# Patient Record
Sex: Female | Born: 1970 | Race: Black or African American | Hispanic: No | Marital: Single | Smoking: Never smoker
Health system: Southern US, Community
[De-identification: ages and names within clinical notes are randomized; demographics above are authoritative.]

## PROBLEM LIST (undated history)

## (undated) DIAGNOSIS — M1990 Unspecified osteoarthritis, unspecified site: Secondary | ICD-10-CM

## (undated) DIAGNOSIS — D649 Anemia, unspecified: Secondary | ICD-10-CM

## (undated) DIAGNOSIS — L0390 Cellulitis, unspecified: Secondary | ICD-10-CM

## (undated) DIAGNOSIS — T7840XA Allergy, unspecified, initial encounter: Secondary | ICD-10-CM

## (undated) DIAGNOSIS — D352 Benign neoplasm of pituitary gland: Secondary | ICD-10-CM

## (undated) DIAGNOSIS — J45909 Unspecified asthma, uncomplicated: Secondary | ICD-10-CM

## (undated) DIAGNOSIS — N926 Irregular menstruation, unspecified: Secondary | ICD-10-CM

## (undated) DIAGNOSIS — E785 Hyperlipidemia, unspecified: Secondary | ICD-10-CM

## (undated) DIAGNOSIS — N6452 Nipple discharge: Secondary | ICD-10-CM

## (undated) DIAGNOSIS — N6320 Unspecified lump in the left breast, unspecified quadrant: Secondary | ICD-10-CM

## (undated) HISTORY — DX: Unspecified osteoarthritis, unspecified site: M19.90

## (undated) HISTORY — DX: Hyperlipidemia, unspecified: E78.5

## (undated) HISTORY — DX: Cellulitis, unspecified: L03.90

## (undated) HISTORY — DX: Anemia, unspecified: D64.9

## (undated) HISTORY — DX: Allergy, unspecified, initial encounter: T78.40XA

## (undated) HISTORY — PX: TONSILLECTOMY: SUR1361

---

## 2013-12-19 ENCOUNTER — Encounter: Payer: Self-pay | Admitting: Internal Medicine

## 2013-12-19 ENCOUNTER — Ambulatory Visit: Payer: Self-pay | Admitting: Internal Medicine

## 2013-12-19 VITALS — BP 110/64 | HR 76 | Temp 98.1°F | Resp 16 | Ht 64.75 in | Wt 235.4 lb

## 2013-12-19 DIAGNOSIS — Z889 Allergy status to unspecified drugs, medicaments and biological substances status: Secondary | ICD-10-CM

## 2013-12-19 DIAGNOSIS — D352 Benign neoplasm of pituitary gland: Secondary | ICD-10-CM | POA: Insufficient documentation

## 2013-12-19 DIAGNOSIS — D649 Anemia, unspecified: Secondary | ICD-10-CM | POA: Insufficient documentation

## 2013-12-19 DIAGNOSIS — Z91018 Allergy to other foods: Secondary | ICD-10-CM | POA: Insufficient documentation

## 2013-12-19 DIAGNOSIS — J45909 Unspecified asthma, uncomplicated: Secondary | ICD-10-CM

## 2013-12-19 DIAGNOSIS — J302 Other seasonal allergic rhinitis: Secondary | ICD-10-CM | POA: Insufficient documentation

## 2013-12-19 MED ORDER — EPINEPHRINE 0.3 MG/0.3ML IJ SOAJ: INTRAMUSCULAR | Status: DC

## 2013-12-19 MED ORDER — ALBUTEROL SULFATE HFA 108 (90 BASE) MCG/ACT IN AERS: INHALATION_SPRAY | RESPIRATORY_TRACT | Status: DC

## 2013-12-19 NOTE — Patient Instructions (Signed)

## 2013-12-19 NOTE — Progress Notes (Signed)
   Subjective:    Patient ID: Wanda Douglas, female    DOB: 04-08-1971, 43 y.o.   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 Patient has hx/o infrequent allergic asthma for which she uses a prn albuterol MDI. She also reports several food allergies as bananas, nuts, and shellfish causing hives. She has a remote hx/o pituitary adenoma (1997) for which she has been sporadically treated with Bromocriptine for elevated prolactin levels.  She reports TBc skin test done recently was negative.  No Current Meds.  No Known Drug Allergies.   Review of Systems neg except as above.   Objective:   Physical Exam BP 110/64  Pulse 76  Temp(Src) 98.1 F (36.7 C) (Temporal)  Resp 16  Ht 5' 4.75" (1.645 m)  Wt 235 lb 6.4 oz (106.777 kg)  BMI 39.46 kg/m2  HEENT - Eac's patent. TM's Nl. EOM's full. PERRLA. NasoOroPharynx clear. Neck - supple. Nl Thyroid. Carotids 2+ & No bruits, nodes, JVD Chest - Clear equal BS w/o Rales, rhonchi, wheezes. Cor - Nl HS. RRR w/o sig MGR. PP 1(+). No edema. Abd - No palpable organomegaly, masses or tenderness. BS nl. MS- FROM w/o deformities. Muscle power, tone and bulk Nl. Gait Nl. Neuro - No obvious Cr N abnormalities. Sensory, motor and Cerebellar functions appear Nl w/o focal abnormalities. Psyche - Mental status normal & appropriate.  No delusions, ideations or obvious mood abnormalities.  Assessment & Plan:   1. Intrinsic asthma  Rx written for Albuterol MDI and EpiPen Duo  Forms filled out for employment.

## 2015-05-07 ENCOUNTER — Other Ambulatory Visit: Payer: Self-pay | Admitting: Physician Assistant

## 2015-05-07 ENCOUNTER — Encounter: Payer: Self-pay | Admitting: Physician Assistant

## 2015-05-07 ENCOUNTER — Ambulatory Visit (INDEPENDENT_AMBULATORY_CARE_PROVIDER_SITE_OTHER): Payer: BC Managed Care – PPO | Admitting: Physician Assistant

## 2015-05-07 VITALS — BP 118/64 | HR 65 | Temp 97.5°F | Ht 64.75 in | Wt 224.0 lb

## 2015-05-07 DIAGNOSIS — Z13220 Encounter for screening for lipoid disorders: Secondary | ICD-10-CM

## 2015-05-07 DIAGNOSIS — N6452 Nipple discharge: Secondary | ICD-10-CM | POA: Diagnosis not present

## 2015-05-07 DIAGNOSIS — Z131 Encounter for screening for diabetes mellitus: Secondary | ICD-10-CM | POA: Diagnosis not present

## 2015-05-07 DIAGNOSIS — E221 Hyperprolactinemia: Secondary | ICD-10-CM

## 2015-05-07 LAB — COMPREHENSIVE METABOLIC PANEL
ALBUMIN: 3.6 g/dL (ref 3.6–5.1)
ALT: 14 U/L (ref 6–29)
AST: 15 U/L (ref 10–30)
Alkaline Phosphatase: 75 U/L (ref 33–115)
BUN: 15 mg/dL (ref 7–25)
CHLORIDE: 105 mmol/L (ref 98–110)
CO2: 25 mmol/L (ref 20–31)
CREATININE: 0.75 mg/dL (ref 0.50–1.10)
Calcium: 8.6 mg/dL (ref 8.6–10.2)
Glucose, Bld: 87 mg/dL (ref 65–99)
Potassium: 3.9 mmol/L (ref 3.5–5.3)
SODIUM: 139 mmol/L (ref 135–146)
TOTAL PROTEIN: 6.4 g/dL (ref 6.1–8.1)
Total Bilirubin: 0.3 mg/dL (ref 0.2–1.2)

## 2015-05-07 LAB — CBC WITH DIFFERENTIAL/PLATELET
Basophils Absolute: 0 10*3/uL (ref 0.0–0.1)
Basophils Relative: 0 % (ref 0–1)
Eosinophils Absolute: 0.1 10*3/uL (ref 0.0–0.7)
Eosinophils Relative: 2 % (ref 0–5)
HEMATOCRIT: 40.5 % (ref 36.0–46.0)
Hemoglobin: 13 g/dL (ref 12.0–15.0)
LYMPHS ABS: 2.5 10*3/uL (ref 0.7–4.0)
LYMPHS PCT: 36 % (ref 12–46)
MCH: 26.9 pg (ref 26.0–34.0)
MCHC: 32.1 g/dL (ref 30.0–36.0)
MCV: 83.9 fL (ref 78.0–100.0)
MONOS PCT: 8 % (ref 3–12)
MPV: 11.2 fL (ref 8.6–12.4)
Monocytes Absolute: 0.6 10*3/uL (ref 0.1–1.0)
NEUTROS ABS: 3.8 10*3/uL (ref 1.7–7.7)
NEUTROS PCT: 54 % (ref 43–77)
Platelets: 204 10*3/uL (ref 150–400)
RBC: 4.83 MIL/uL (ref 3.87–5.11)
RDW: 14.2 % (ref 11.5–15.5)
WBC: 7 10*3/uL (ref 4.0–10.5)

## 2015-05-07 LAB — LIPID PANEL
Cholesterol: 184 mg/dL (ref 125–200)
HDL: 65 mg/dL (ref >=46)
LDL CALC: 99 mg/dL (ref <130)
TRIGLYCERIDES: 99 mg/dL (ref <150)
Total CHOL/HDL Ratio: 2.8 Ratio (ref <=5.0)
VLDL: 20 mg/dL (ref <30)

## 2015-05-07 LAB — TSH: TSH: 0.518 u[IU]/mL (ref 0.350–4.500)

## 2015-05-07 NOTE — Progress Notes (Addendum)
Subjective:    Patient ID: Wanda Douglas, female    DOB: Sep 23, 1970, 45 y.o.   MRN: [id number]  HPI  45 y.o. 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 Last MGM was 2 years ago in [place], [facility] with breast cancer, no history of ovarian cancer. Patient has had headaches, no visual distrubances, no hypothyroidism symptoms, night sweats, weight loss. Patient is not sexually active.   Blood pressure 118/64, pulse 65, temperature 97.5 F (36.4 C), temperature source Temporal, height 5' 4.75" (1.645 m), weight 224 lb (101.606 kg), last menstrual period 05/03/2015, SpO2 97 %.  No past medical history on file. Current Outpatient Prescriptions on File Prior to Visit  Medication Sig Dispense Refill  . albuterol (PROVENTIL HFA;VENTOLIN HFA) 108 (90 BASE) MCG/ACT inhaler 1 to 2 puffs (5 minutes apart) every 4 hour as needed for Asthma 8.5 g 99  . calcium carbonate (TUMS EX) 750 MG chewable tablet Chew 2 tablets by mouth daily.    . cetirizine (ZYRTEC) 10 MG tablet Take 10 mg by mouth daily.    . cholecalciferol (VITAMIN D) 1000 UNITS tablet Take 1,000 Units by mouth daily.    Marland Kitchen EPINEPHrine 0.3 mg/0.3 mL IJ SOAJ injection Inject intramuscular for severe Allergic Reaction and may repeat in 10-15 minutes if needed 2 Device 99  . Multiple Vitamin (MULTIVITAMIN) tablet Take 1 tablet by mouth daily.     No current facility-administered medications on file prior to visit.    Review of Systems  Constitutional: Negative.   HENT: Negative.   Respiratory: Negative.   Cardiovascular: Negative.   Gastrointestinal: Negative.   Genitourinary: Negative.        + bloody discharge from left nipple  Musculoskeletal: Negative.   Skin: Negative.   Neurological: Negative.   Hematological: Negative.     Psychiatric/Behavioral: Negative.        Objective:   Physical Exam  Constitutional: She is oriented to person, place, and time. She appears well-developed and well-nourished.  Obese  HENT:  Head: Normocephalic and atraumatic.  Eyes: Conjunctivae and EOM are normal. Pupils are equal, round, and reactive to light.  Neck: Normal range of motion. Neck supple.  Cardiovascular: Normal rate and regular rhythm.   Pulmonary/Chest: Effort normal and breath sounds normal. Right breast exhibits no inverted nipple, no mass, no nipple discharge, no skin change and no tenderness. Left breast exhibits mass and nipple discharge. Left breast exhibits no inverted nipple, no skin change and no tenderness.    Abdominal: Soft. Bowel sounds are normal. There is no tenderness.  Musculoskeletal: Normal range of motion.  Lymphadenopathy:    She has no cervical adenopathy.    She has no axillary adenopathy.       Right: No supraclavicular adenopathy present.       Left: No supraclavicular adenopathy present.  Neurological: She is alert and oriented to person, place, and time. She has normal reflexes. No cranial nerve deficit.  Skin: Skin is warm and dry.        Assessment & Plan:  1. Bloody discharge from left nipple Has put in order for Diagnotic MGM and [place] Check labs - CBC with Differential/Platelet - TSH - Comprehensive metabolic panel - Prolactin  2. Morbid obesity, unspecified obesity type ([place]) Obesity with co morbidities- long discussion about weight loss, diet, and exercise  3. Screening for diabetes mellitus - Hemoglobin A1c  4. Screening cholesterol level - Lipid panel  Addendum: Elevated prolactin, will get MRI for possible adenoma but will proceed after MGM.

## 2015-05-08 LAB — HEMOGLOBIN A1C
HEMOGLOBIN A1C: 5.7 % — AB (ref <5.7)
Mean Plasma Glucose: 117 mg/dL — ABNORMAL HIGH (ref <117)

## 2015-05-08 LAB — PROLACTIN: PROLACTIN: 103.4 ng/mL

## 2015-05-08 NOTE — Addendum Note (Signed)
Addended by: Vicie Mutters R on: 05/08/2015 09:35 AM   Modules accepted: Orders

## 2015-05-10 ENCOUNTER — Other Ambulatory Visit: Payer: Self-pay | Admitting: Physician Assistant

## 2015-05-10 ENCOUNTER — Ambulatory Visit
Admission: RE | Admit: 2015-05-10 | Discharge: 2015-05-10 | Disposition: A | Discharge status: Home / Self Care | Payer: BC Managed Care – PPO | Source: Ambulatory Visit | Attending: Physician Assistant | Admitting: Physician Assistant

## 2015-05-10 DIAGNOSIS — N6452 Nipple discharge: Secondary | ICD-10-CM

## 2015-05-13 ENCOUNTER — Other Ambulatory Visit: Payer: Self-pay | Admitting: Physician Assistant

## 2015-05-13 DIAGNOSIS — N6452 Nipple discharge: Secondary | ICD-10-CM

## 2015-05-16 ENCOUNTER — Ambulatory Visit
Admission: RE | Admit: 2015-05-16 | Discharge: 2015-05-16 | Disposition: A | Discharge status: Home / Self Care | Payer: BC Managed Care – PPO | Source: Ambulatory Visit | Attending: Physician Assistant | Admitting: Physician Assistant

## 2015-05-16 DIAGNOSIS — N6452 Nipple discharge: Secondary | ICD-10-CM

## 2015-06-05 ENCOUNTER — Ambulatory Visit
Admission: RE | Admit: 2015-06-05 | Discharge: 2015-06-05 | Disposition: A | Discharge status: Home / Self Care | Payer: BC Managed Care – PPO | Source: Ambulatory Visit | Attending: Physician Assistant | Admitting: Physician Assistant

## 2015-06-05 DIAGNOSIS — E221 Hyperprolactinemia: Secondary | ICD-10-CM

## 2015-06-05 DIAGNOSIS — N6452 Nipple discharge: Secondary | ICD-10-CM

## 2015-06-13 ENCOUNTER — Other Ambulatory Visit: Payer: Self-pay | Admitting: Physician Assistant

## 2015-06-13 DIAGNOSIS — E221 Hyperprolactinemia: Secondary | ICD-10-CM

## 2015-06-13 DIAGNOSIS — E229 Hyperfunction of pituitary gland, unspecified: Secondary | ICD-10-CM

## 2015-06-13 DIAGNOSIS — D352 Benign neoplasm of pituitary gland: Secondary | ICD-10-CM

## 2015-11-08 ENCOUNTER — Encounter (HOSPITAL_COMMUNITY): Payer: Self-pay | Admitting: Emergency Medicine

## 2015-11-08 ENCOUNTER — Emergency Department (HOSPITAL_COMMUNITY)
Admission: EM | Admit: 2015-11-08 | Discharge: 2015-11-09 | Disposition: A | Discharge status: Home / Self Care | Payer: BC Managed Care – PPO | Attending: Emergency Medicine | Admitting: Emergency Medicine

## 2015-11-08 DIAGNOSIS — Z79899 Other long term (current) drug therapy: Secondary | ICD-10-CM | POA: Insufficient documentation

## 2015-11-08 DIAGNOSIS — Z91010 Allergy to peanuts: Secondary | ICD-10-CM | POA: Diagnosis not present

## 2015-11-08 DIAGNOSIS — R0602 Shortness of breath: Secondary | ICD-10-CM | POA: Diagnosis present

## 2015-11-08 DIAGNOSIS — J029 Acute pharyngitis, unspecified: Secondary | ICD-10-CM | POA: Insufficient documentation

## 2015-11-08 DIAGNOSIS — Z91013 Allergy to seafood: Secondary | ICD-10-CM | POA: Diagnosis not present

## 2015-11-08 DIAGNOSIS — J45909 Unspecified asthma, uncomplicated: Secondary | ICD-10-CM | POA: Diagnosis not present

## 2015-11-08 DIAGNOSIS — Z91018 Allergy to other foods: Secondary | ICD-10-CM | POA: Insufficient documentation

## 2015-11-08 HISTORY — DX: Unspecified asthma, uncomplicated: J45.909

## 2015-11-08 LAB — RAPID STREP SCREEN (MED CTR MEBANE ONLY): Streptococcus, Group A Screen (Direct): NEGATIVE

## 2015-11-08 NOTE — ED Notes (Signed)
Pt states about 4 hours ago her throat started to get sore and it has gotten progressively worse.  States it is hurting to swallow and speak.

## 2015-11-09 MED ORDER — LIDOCAINE VISCOUS 2 % MT SOLN
15.0000 mL | Freq: Once | OROMUCOSAL | Status: AC
  Administered 2015-11-09: 15 mL via OROMUCOSAL
  Filled 2015-11-09: qty 15

## 2015-11-09 MED ORDER — LIDOCAINE VISCOUS 2 % MT SOLN: 15.0000 mL | Freq: Four times a day (QID) | OROMUCOSAL | Status: DC | PRN

## 2015-11-09 MED ORDER — ACETAMINOPHEN 325 MG PO TABS
650.0000 mg | ORAL_TABLET | Freq: Once | ORAL | Status: AC
  Administered 2015-11-09: 650 mg via ORAL
  Filled 2015-11-09: qty 2

## 2015-11-09 NOTE — Discharge Instructions (Signed)
Read the information below.   Your rapid strep was negative. It will be sent for culture, if positive you will be notified. You were given tylenol and viscous lidocaine in the ED.  I have provided a prescription for viscous lidocaine. You can also try cepacol throat drops for relief. Take tylenol 650mg  every 6hrs or motrin 400mg  every 6hrs as needed for pain relief.  At this time, you have declined an x-ray. If you develop shortness of breath, wheezing, cough, or fever return to ED for re-evaluation.  Use the prescribed medication as directed.  Please discuss all new medications with your pharmacist.   Be sure to call and schedule a follow up appointment with your primary care provider if your symptoms do not improve. You may return to the Emergency Department at any time for worsening condition or any new symptoms that concern you. Return to ED immediately if you develop worsening symptoms, fever, inability to swallow, trouble breathing, shortness of breath, or chest pain.     Pharyngitis Pharyngitis is a sore throat (pharynx). There is redness, pain, and swelling of your throat. HOME CARE   Drink enough fluids to keep your pee (urine) clear or pale yellow.  Only take medicine as told by your doctor.  You may get sick again if you do not take medicine as told. Finish your medicines, even if you start to feel better.  Do not take aspirin.  Rest.  Rinse your mouth (gargle) with salt water ( tsp of salt per 1 qt of water) every 1-2 hours. This will help the pain.  If you are not at risk for choking, you can suck on hard candy or sore throat lozenges. GET HELP IF:  You have large, tender lumps on your neck.  You have a rash.  You cough up green, yellow-brown, or bloody spit. GET HELP RIGHT AWAY IF:   You have a stiff neck.  You drool or cannot swallow liquids.  You throw up (vomit) or are not able to keep medicine or liquids down.  You have very bad pain that does not go away  with medicine.  You have problems breathing (not from a stuffy nose). MAKE SURE YOU:   Understand these instructions.  Will watch your condition.  Will get help right away if you are not doing well or get worse.   This information is not intended to replace advice given to you by your health care provider. Make sure you discuss any questions you have with your health care provider.   Document Released: 10/07/2007 Document Revised: 02/08/2013 Document Reviewed: 12/26/2012 Elsevier Interactive Patient Education [employer].

## 2015-11-09 NOTE — ED Provider Notes (Signed)
CSN: [id number]     Arrival date & time 11/08/15  2316 History   First MD Initiated Contact with Patient 11/08/15 2356     Chief Complaint  Patient presents with  . Sore Throat     (Consider location/radiation/quality/duration/timing/severity/associated sxs/prior Treatment) HPI Comments: Wanda Douglas is a 45 y.o. female with history of asthma, pituitary disease to ED with complaint of sore throat x 4 hours. Describes sore throat as "dry and scratchy." Associated odynophagia. She is managing her oral secretions and she is able to eat and drink without difficulty. Prior to her sore throat she did have nasal congestion; however, sxs have since resolved. She does endorse a dry cough and some shortness of breath. Denies fever, chills, night sweats, wheezing, chest pain, leg swelling. No fever, chills, or night sweats. She does endorse a non-productive cough and some shortness of breath. Denies chest pain, wheezing, leg swelling. She has tried tea with honey, which provided transient relief.   The history is provided by the patient and medical records.    Past Medical History  Diagnosis Date  . Pituitary abnormality ([place])   . Asthma    Past Surgical History  Procedure Laterality Date  . Tonsillectomy     No family history on file. Social History  Substance Use Topics  . Smoking status: Never Smoker   . Smokeless tobacco: Never Used  . Alcohol Use: No   OB History    No data available     Review of Systems  Constitutional: Negative for fever, chills, diaphoresis and fatigue.  HENT: Positive for congestion ( resolved) and sore throat. Negative for drooling, ear pain, postnasal drip, rhinorrhea, sinus pressure and trouble swallowing.   Eyes: Negative for discharge.  Respiratory: Positive for cough and shortness of breath. Negative for wheezing.   Cardiovascular: Negative for chest pain, palpitations and leg swelling.  Musculoskeletal: Negative for myalgias and arthralgias.   Allergic/Immunologic: Positive for environmental allergies.  Neurological: Negative for headaches.      Allergies  Banana; Shellfish allergy; and Peanut-containing drug products  Home Medications   Prior to Admission medications   Medication Sig Start Date End Date Taking? Authorizing Provider  albuterol (PROVENTIL HFA;VENTOLIN HFA) 108 (90 BASE) MCG/ACT inhaler 1 to 2 puffs (5 minutes apart) every 4 hour as needed for Asthma 12/19/13   Unk Pinto, MD  calcium carbonate (TUMS EX) 750 MG chewable tablet Chew 2 tablets by mouth daily.    Historical Provider, MD  cetirizine (ZYRTEC) 10 MG tablet Take 10 mg by mouth daily.    Historical Provider, MD  cholecalciferol (VITAMIN D) 1000 UNITS tablet Take 1,000 Units by mouth daily.    Historical Provider, MD  EPINEPHrine 0.3 mg/0.3 mL IJ SOAJ injection Inject intramuscular for severe Allergic Reaction and may repeat in 10-15 minutes if needed 12/19/13   Unk Pinto, MD  lidocaine (XYLOCAINE) 2 % solution Use as directed 15 mLs in the mouth or throat every 6 (six) hours as needed for mouth pain. Gargle and spit every 6hrs as needed for sore throat relief. 11/09/15   Roxanna Mew, PA-C  Multiple Vitamin (MULTIVITAMIN) tablet Take 1 tablet by mouth daily.    Historical Provider, MD   BP 129/87 mmHg  Pulse 67  Temp(Src) 98.8 F (37.1 C) (Oral)  Resp 18  Ht 5\' 3"  (1.6 m)  Wt 103.874 kg  BMI 40.58 kg/m2  SpO2 100%  LMP 10/21/2015 (Approximate) Physical Exam  Constitutional: She appears well-developed and well-nourished. No distress.  HENT:  Head: Normocephalic and atraumatic.  Right Ear: Tympanic membrane, external ear and ear canal normal.  Left Ear: Tympanic membrane, external ear and ear canal normal.  Nose: Right sinus exhibits no maxillary sinus tenderness and no frontal sinus tenderness. Left sinus exhibits no maxillary sinus tenderness and no frontal sinus tenderness.  Mouth/Throat: Uvula is midline and mucous membranes  are normal. No trismus in the jaw. No uvula swelling. Oropharyngeal exudate and posterior oropharyngeal erythema present. No posterior oropharyngeal edema or tonsillar abscesses.  Mild posterior oropharyngeal erythema with exudate. No trismus. Uvula is midline. No sublingual swelling. No soft palate swelling.    Eyes: Conjunctivae are normal. Pupils are equal, round, and reactive to light. Right eye exhibits no discharge. Left eye exhibits no discharge. No scleral icterus.  Neck: Normal range of motion and phonation normal. No rigidity. Normal range of motion present.  Cardiovascular: Normal rate, regular rhythm and normal heart sounds.   No murmur heard. Pulmonary/Chest: Effort normal and breath sounds normal. No stridor. No respiratory distress. She has no wheezes. She has no rales.  Lymphadenopathy:    She has cervical adenopathy.  Neurological: She is alert.  Skin: Skin is warm and dry. She is not diaphoretic.  Psychiatric: She has a normal mood and affect. Her behavior is normal.    ED Course  Procedures (including critical care time) Labs Review Labs Reviewed  RAPID STREP SCREEN (NOT AT [facility])  CULTURE, GROUP A STREP [facility])    Imaging Review No results found. I have personally reviewed and evaluated these images and lab results as part of my medical decision-making.   EKG Interpretation None      MDM   Final diagnoses:  Pharyngitis    Pt afebrile and non-toxic appearing in NAD. Blood pressure is mildly elevated, vital signs otherwise stable. Physical exam remarkable for mild posterior oropharyngeal erythema with exudate, negative strep. Presents with mild cervical lymphadenopathy, & odynophagia. Lungs are clear to auscultation, no wheezing.   Diagnosis of viral pharyngitis. No abx indicated. DC w symptomatic tx for pain.  Pt does not appear dehydrated, but did discuss importance of water rehydration. Presentation non concerning for PTA or infxn spread to soft tissue. No  trismus or uvula deviation. Discussed option of CXR, pt politely declined at this time will return if cough, wheezing, or SOB develops. Tylenol and viscous lidocaine given in ED. Specific return precautions discussed. Pt able to drink water in ED without difficulty with intact air way. Recommended PCP follow up. Pt voiced understanding and is agreeable.      Roxanna Mew, [place] 11/09/15 1047  Lacretia Leigh, MD 11/10/15 0001

## 2015-11-11 LAB — CULTURE, GROUP A STREP (THRC)

## 2017-04-14 ENCOUNTER — Encounter: Payer: Self-pay | Admitting: Physician Assistant

## 2017-04-14 ENCOUNTER — Ambulatory Visit: Payer: BC Managed Care – PPO | Admitting: Physician Assistant

## 2017-04-14 VITALS — BP 128/76 | HR 90 | Temp 97.5°F | Resp 14 | Wt 240.0 lb

## 2017-04-14 DIAGNOSIS — D352 Benign neoplasm of pituitary gland: Secondary | ICD-10-CM

## 2017-04-14 DIAGNOSIS — Z79899 Other long term (current) drug therapy: Secondary | ICD-10-CM | POA: Diagnosis not present

## 2017-04-14 DIAGNOSIS — Z6841 Body Mass Index (BMI) 40.0 and over, adult: Secondary | ICD-10-CM

## 2017-04-14 DIAGNOSIS — R7303 Prediabetes: Secondary | ICD-10-CM | POA: Diagnosis not present

## 2017-04-14 DIAGNOSIS — M722 Plantar fascial fibromatosis: Secondary | ICD-10-CM | POA: Diagnosis not present

## 2017-04-14 DIAGNOSIS — B372 Candidiasis of skin and nail: Secondary | ICD-10-CM

## 2017-04-14 DIAGNOSIS — N912 Amenorrhea, unspecified: Secondary | ICD-10-CM

## 2017-04-14 MED ORDER — ALBUTEROL SULFATE HFA 108 (90 BASE) MCG/ACT IN AERS: INHALATION_SPRAY | RESPIRATORY_TRACT | 99 refills | Status: DC

## 2017-04-14 MED ORDER — EPINEPHRINE 0.3 MG/0.3ML IJ SOAJ: 0.3000 mg | Freq: Once | INTRAMUSCULAR | 0 refills | Status: AC

## 2017-04-14 MED ORDER — MELOXICAM 15 MG PO TABS: ORAL_TABLET | ORAL | 1 refills | Status: DC

## 2017-04-14 MED ORDER — TRIAMCINOLONE ACETONIDE 0.5 % EX CREA
1.0000 | TOPICAL_CREAM | Freq: Two times a day (BID) | CUTANEOUS | 2 refills | Status: DC
Start: 2017-04-14 — End: 2018-01-04

## 2017-04-14 MED ORDER — FLUCONAZOLE 150 MG PO TABS: 150.0000 mg | ORAL_TABLET | Freq: Every day | ORAL | 3 refills | Status: DC

## 2017-04-14 MED ORDER — NYSTATIN 100000 UNIT/GM EX CREA
1.0000 | TOPICAL_CREAM | Freq: Two times a day (BID) | CUTANEOUS | 1 refills | Status: DC
Start: 2017-04-14 — End: 2018-01-04

## 2017-04-14 NOTE — Progress Notes (Signed)
FOLLOW UP  Assessment and Plan:   Prolactin secreting Pituitary Adenoma (1997) If positive, likely causing amenorrhea, no HA, vision changes, or dizziness Will start bromocriptine 1.25mg  at night x 1 week then BID if elevated and recheck 1 month -     Prolactin  Morbid obesity, unspecified obesity type ([place]) - follow up 3 months for progress monitoring - increase veggies, decrease carbs - long discussion about weight loss, diet, and exercise -     Lipid panel  BMI 40.0-44.9, adult ([facility]) - follow up 3 months for progress monitoring - increase veggies, decrease carbs - long discussion about weight loss, diet, and exercise  Prediabetes Discussed disease progression and risks Discussed diet/exercise, weight management and risk modification -     Hemoglobin A1c  Amenorrhea Check labs -     Follicle stimulating hormone -     TSH  Medication management -     CBC with Differential/Platelet -     BASIC METABOLIC PANEL WITH GFR -     Hepatic function panel  Yeast dermatitis -     nystatin cream (MYCOSTATIN); Apply 1 application topically 2 (two) times daily. -     triamcinolone cream (KENALOG) 0.5 %; Apply 1 application topically 2 (two) times daily. -     fluconazole (DIFLUCAN) 150 MG tablet; Take 1 tablet (150 mg total) by mouth daily.  Plantar fasciitis -     meloxicam (MOBIC) 15 MG tablet; Take one daily with food for 2 weeks, can take with tylenol, can not take with aleve, iburpofen, then as needed daily for pain Conservative treatment, night time orthotics, arch support, RICE, NSAID, stretches given If not better will do injection of dexamethasone   Other orders -     albuterol (PROVENTIL HFA;VENTOLIN HFA) 108 (90 Base) MCG/ACT inhaler; 1 to 2 puffs (5 minutes apart) every 4 hour as needed for Asthma -     EPINEPHrine 0.3 mg/0.3 mL IJ SOAJ injection; Inject 0.3 mLs (0.3 mg total) into the muscle once for 1 dose.   Continue diet and meds as discussed. Further disposition  pending results of labs. Over 30 minutes of exam, counseling, chart review, and critical decision making was performed  No future appointments.   HPI 46 y.o. female  presents for 3 month follow up on hypertension, cholesterol, prediabetes, and vitamin D deficiency.   She has not had a menses for 8 months, she has never been sexually active, she has history of elevated prolactin and microprolactinoma. Has seen neurosurgery and does not need surgery. She will occ have headache, but no vision changes, no dizziness. She has had weight gain.   She also complains of rash on her foot, left inguinal area.   She has left foot pain x 3 weeks. Started to have rash on her medial leg, then a few days after that had edema. 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 Worse with pressure.   Her blood pressure has been controlled at home, today their BP is BP: 128/76   She does not workout. She denies chest pain, shortness of breath, dizziness.   She  is not  on cholesterol medication and denies myalgias. Her cholesterol is at goal. The cholesterol last visit was:   Lab Results  Component Value Date   CHOL 184 05/07/2015   HDL 65 05/07/2015   LDLCALC 99 05/07/2015   TRIG 99 05/07/2015   CHOLHDL 2.8 05/07/2015  She has been working on diet and exercise for prediabetes, and denies paresthesia of the feet, polydipsia, polyuria and visual disturbances. Last A1C in the office was:  Lab Results  Component Value Date   HGBA1C 5.7 (H) 05/07/2015   BMI is Body mass index is 42.51 kg/m., she is working on diet and exercise. Wt Readings from Last 3 Encounters:  04/14/17 240 lb (108.9 kg)  11/08/15 229 lb (103.9 kg)  05/07/15 224 lb (101.6 kg)   MRI brain: PRESSION: 1. 6 x 7 x 3 mm hypo enhancing mass lesion along the right anterior inferior aspect of the sella compatible with a a residual or recurrent pituitary microadenoma. 2.  Otherwise normal MRI appearance of the brain.  Current Medications:  Current Outpatient Medications on File Prior to Visit  Medication Sig  . calcium carbonate (TUMS EX) 750 MG chewable tablet Chew 2 tablets by mouth daily.  . cetirizine (ZYRTEC) 10 MG tablet Take 10 mg by mouth daily.  . cholecalciferol (VITAMIN D) 1000 UNITS tablet Take 1,000 Units by mouth daily.  Marland Kitchen lidocaine (XYLOCAINE) 2 % solution Use as directed 15 mLs in the mouth or throat every 6 (six) hours as needed for mouth pain. Gargle and spit every 6hrs as needed for sore throat relief.  . Multiple Vitamin (MULTIVITAMIN) tablet Take 1 tablet by mouth daily.   No current facility-administered medications on file prior to visit.     Medical History:  Past Medical History:  Diagnosis Date  . Asthma   . Pituitary abnormality ([facility])    Allergies:  Allergies  Allergen Reactions  . Banana Hives and Shortness Of Breath  . Shellfish Allergy Hives and Shortness Of Breath  . Peanut-Containing Drug Products Hives    States she is allergic to all types of nuts     Review of Systems:  ROS See HPI  Family history- Review and unchanged Social history- Review and unchanged Physical Exam: BP 128/76   Pulse 90   Temp (!) 97.5 F (36.4 C)   Resp 14   Wt 240 lb (108.9 kg)   SpO2 98%   BMI 42.51 kg/m  Wt Readings from Last 3 Encounters:  04/14/17 240 lb (108.9 kg)  11/08/15 229 lb (103.9 kg)  05/07/15 224 lb (101.6 kg)   General Appearance: Well nourished, in no apparent distress. Eyes: PERRLA, EOMs, conjunctiva no swelling or erythema Sinuses: No Frontal/maxillary tenderness ENT/Mouth: Ext aud canals clear, TMs without erythema, bulging. No erythema, swelling, or exudate on post pharynx.  Tonsils not swollen or erythematous. Hearing normal.  Neck: Supple, thyroid normal.  Respiratory: Respiratory effort normal, BS equal bilaterally without rales, rhonchi, wheezing or stridor.  Cardio: RRR with no MRGs. Brisk  peripheral pulses without edema.  Abdomen: Soft, + BS,  Non tender, no guarding, rebound, hernias, masses. Lymphatics: Non tender without lymphadenopathy.  Musculoskeletal: Full ROM, 5/5 strength, Normal gait, + right heel pain to palpation.  Skin: + yeast between toes bilateral, no ulcers, and + yeast right inguinal fold Warm, dry without rashes, lesions, ecchymosis.  Neuro: Cranial nerves intact. Normal muscle tone, no cerebellar symptoms. Psych: Awake and oriented X 3, normal affect, Insight and Judgment appropriate.    Vicie Mutters, PA-C 5:30 PM [facility]

## 2017-04-14 NOTE — Patient Instructions (Addendum)
-Take Nystatin Cream use more than the triamcinolone cream but can mix together and use on rash Take diflucan pill x 1 day to help with the yeast Do the creams twice a day for 2 -4 weeks  Directions for applying anti-fungal cream:  -Wash and dry the area first.  -Apply the cream, beginning just outside the area of the rash and moving toward the center.  -Be sure to wash and dry your hands afterward.  -Do not use a bandage over ringworm.  To prevent the infection from spreading:  -Wash all towels in warm, soapy water and then dry them.  -Use a new towel and washcloth every time you wash.  -Clean sinks, bathtubs, and bathroom floors well after each use.  -Wear clean clothes every day and do not share clothes.    Plantar Fasciitis Plantar fasciitis is a painful foot condition that affects the heel. It occurs when the band of tissue that connects the toes to the heel bone (plantar fascia) becomes irritated. This can happen after exercising too much or doing other repetitive activities (overuse injury). The pain from plantar fasciitis can range from mild irritation to severe pain that makes it difficult for you to walk or move. The pain is usually worse in the morning or after you have been sitting or lying down for a while. What are the causes? This condition may be caused by:  Standing for long periods of time.  Wearing shoes that do not fit.  Doing high-impact activities, including running, aerobics, and ballet.  Being overweight.  Having an abnormal way of walking (gait).  Having tight calf muscles.  Having high arches in your feet.  Starting a new athletic activity.  What are the signs or symptoms? The main symptom of this condition is heel pain. Other symptoms include:  Pain that gets worse after activity or exercise.  Pain that is worse in the morning or after resting.  Pain that goes away after you walk for a few minutes.  How is this diagnosed? This condition may be  diagnosed based on your signs and symptoms. Your health care provider will also do a physical exam to check for:  A tender area on the bottom of your foot.  A high arch in your foot.  Pain when you move your foot.  Difficulty moving your foot.  You may also need to have imaging studies to confirm the diagnosis. These can include:  X-rays.  Ultrasound.  MRI.  How is this treated? Treatment for plantar fasciitis depends on the severity of the condition. Your treatment may include:  Rest, ice, and over-the-counter pain medicines to manage your pain.  Exercises to stretch your calves and your plantar fascia.  A splint that holds your foot in a stretched, upward position while you sleep (night splint).  Physical therapy to relieve symptoms and prevent problems in the future.  Cortisone injections to relieve severe pain.  Extracorporeal shock wave therapy (ESWT) to stimulate damaged plantar fascia with electrical impulses. It is often used as a last resort before surgery.  Surgery, if other treatments have not worked after 12 months.  Follow these instructions at home:  Take medicines only as directed by your health care provider.  Avoid activities that cause pain.  Roll the bottom of your foot over a bag of ice or a bottle of cold water. Do this for 20 minutes, 3-4 times a day.  Perform simple stretches as directed by your health care provider.  Try wearing  athletic shoes with air-sole or gel-sole cushions or soft shoe inserts.  Wear a night splint while sleeping, if directed by your health care provider.  Keep all follow-up appointments with your health care provider. How is this prevented?  Do not perform exercises or activities that cause heel pain.  Consider finding low-impact activities if you continue to have problems.  Lose weight if you need to. The best way to prevent plantar fasciitis is to avoid the activities that aggravate your plantar fascia. Contact  a health care provider if:  Your symptoms do not go away after treatment with home care measures.  Your pain gets worse.  Your pain affects your ability to move or do your daily activities. This information is not intended to replace advice given to you by your health care provider. Make sure you discuss any questions you have with your health care provider. Document Released: 01/13/2001 Document Revised: 09/23/2015 Document Reviewed: 02/28/2014 Elsevier Interactive Patient Education  2018 [employer].    Secondary Amenorrhea Secondary amenorrhea is the stopping of menstrual flow for 3-6 months in a female who has previously had periods. There are many possible causes. Most of these causes are not serious. Usually, treating the underlying problem causing the loss of menses will return your periods to normal. What are the causes? Some common and uncommon causes of not menstruating include:  Malnutrition.  Low blood sugar (hypoglycemia).  Polycystic ovary disease.  Stress or fear.  Breastfeeding.  Hormone imbalance.  Ovarian failure.  Medicines.  Extreme obesity.  Cystic fibrosis.  Low body weight or drastic weight reduction from any cause.  Early menopause.  Removal of ovaries or uterus.  Contraceptives.  Illness.  Long-term (chronic) illnesses.  Cushing syndrome.  Thyroid problems.  Birth control pills, patches, or vaginal rings for birth control.  What increases the risk? You may be at greater risk of secondary amenorrhea if:  You have a family history of this condition.  You have an eating disorder.  You do athletic training.  How is this diagnosed? A diagnosis is made by your health care provider taking a medical history and doing a physical exam. This will include a pelvic exam to check for problems with your reproductive organs. Pregnancy must be ruled out. Often, numerous blood tests are done to measure different hormones in the body. Urine  testing may be done. Specialized exams (ultrasound, CT scan, MRI, or hysteroscopy) may have to be done as well as measuring the body mass index (BMI). How is this treated? Treatment depends on the cause of the amenorrhea. If an eating disorder is present, this can be treated with an adequate diet and therapy. Chronic illnesses may improve with treatment of the illness. Amenorrhea may be corrected with medicines, lifestyle changes, or surgery. If the amenorrhea cannot be corrected, it is sometimes possible to create a false menstruation with medicines. Follow these instructions at home:  Maintain a healthy diet.  Manage weight problems.  Exercise regularly but not excessively.  Get adequate sleep.  Manage stress.  Be aware of changes in your menstrual cycle. Keep a record of when your periods occur. Note the date your period starts, how long it lasts, and any problems. Contact a health care provider if: Your symptoms do not get better with treatment. This information is not intended to replace advice given to you by your health care provider. Make sure you discuss any questions you have with your health care provider. Document Released: 06/01/2006 Document Revised: 09/26/2015 Document Reviewed:  10/06/2012 Elsevier Interactive Patient Education  [employer].

## 2017-04-15 LAB — PROLACTIN: PROLACTIN: 52.5 ng/mL — AB

## 2017-04-15 LAB — BASIC METABOLIC PANEL WITH GFR
BUN: 14 mg/dL (ref 7–25)
CALCIUM: 8.8 mg/dL (ref 8.6–10.2)
CHLORIDE: 105 mmol/L (ref 98–110)
CO2: 28 mmol/L (ref 20–32)
Creat: 0.96 mg/dL (ref 0.50–1.10)
GFR, Est African American: 82 mL/min/{1.73_m2} (ref >=60)
GFR, Est Non African American: 71 mL/min/{1.73_m2} (ref >=60)
Glucose, Bld: 97 mg/dL (ref 65–99)
POTASSIUM: 3.7 mmol/L (ref 3.5–5.3)
Sodium: 139 mmol/L (ref 135–146)

## 2017-04-15 LAB — CBC WITH DIFFERENTIAL/PLATELET
BASOS PCT: 0.3 %
Basophils Absolute: 23 cells/uL (ref 0–200)
EOS ABS: 182 {cells}/uL (ref 15–500)
Eosinophils Relative: 2.4 %
HCT: 39.9 % (ref 35.0–45.0)
Hemoglobin: 13.1 g/dL (ref 11.7–15.5)
Lymphs Abs: 2911 cells/uL (ref 850–3900)
MCH: 27.1 pg (ref 27.0–33.0)
MCHC: 32.8 g/dL (ref 32.0–36.0)
MCV: 82.6 fL (ref 80.0–100.0)
MPV: 11.5 fL (ref 7.5–12.5)
Monocytes Relative: 7.4 %
NEUTROS PCT: 51.6 %
Neutro Abs: 3922 cells/uL (ref 1500–7800)
PLATELETS: 186 10*3/uL (ref 140–400)
RBC: 4.83 10*6/uL (ref 3.80–5.10)
RDW: 13 % (ref 11.0–15.0)
TOTAL LYMPHOCYTE: 38.3 %
WBC: 7.6 10*3/uL (ref 3.8–10.8)
WBCMIX: 562 {cells}/uL (ref 200–950)

## 2017-04-15 LAB — HEPATIC FUNCTION PANEL
AG Ratio: 1.2 (calc) (ref 1.0–2.5)
ALBUMIN MSPROF: 3.7 g/dL (ref 3.6–5.1)
ALT: 13 U/L (ref 6–29)
AST: 15 U/L (ref 10–35)
Alkaline phosphatase (APISO): 94 U/L (ref 33–115)
Bilirubin, Direct: 0 mg/dL (ref 0.0–0.2)
Globulin: 3.1 g/dL (calc) (ref 1.9–3.7)
Indirect Bilirubin: 0.3 mg/dL (calc) (ref 0.2–1.2)
Total Bilirubin: 0.3 mg/dL (ref 0.2–1.2)
Total Protein: 6.8 g/dL (ref 6.1–8.1)

## 2017-04-15 LAB — LIPID PANEL
Cholesterol: 190 mg/dL (ref <200)
HDL: 58 mg/dL (ref >50)
LDL CHOLESTEROL (CALC): 111 mg/dL — AB
Non-HDL Cholesterol (Calc): 132 mg/dL (calc) — ABNORMAL HIGH (ref <130)
Total CHOL/HDL Ratio: 3.3 (calc) (ref <5.0)
Triglycerides: 105 mg/dL (ref <150)

## 2017-04-15 LAB — HEMOGLOBIN A1C
EAG (MMOL/L): 6 (calc)
HEMOGLOBIN A1C: 5.4 %{Hb} (ref <5.7)
MEAN PLASMA GLUCOSE: 108 (calc)

## 2017-04-15 LAB — TSH: TSH: 0.59 m[IU]/L

## 2017-04-15 LAB — FOLLICLE STIMULATING HORMONE: FSH: 6.8 m[IU]/mL

## 2017-04-15 NOTE — Progress Notes (Signed)
Pt aware of lab results & voiced understanding of those results.  Pt states that she was told that a medicine would be sent to pharmacy for the elevated prolactin levels, pt was confused by the note which states:will recheck prolactin in 6 months, if increases will start medication. Please advise.

## 2017-04-15 NOTE — Progress Notes (Signed)
LVM for return call on lab results

## 2017-04-19 NOTE — Progress Notes (Signed)
LVM for return call on lab results

## 2017-04-20 NOTE — Progress Notes (Signed)
Pt has been informed of : medication to treat her amenorrhea.  Pt was not happy with this & would like to speak with provider, pt states she also would like a referral to ENDO. Pt states she is aware that she needs to make an appt to see an EYE Dr. Please advise?

## 2017-05-04 HISTORY — PX: OTHER SURGICAL HISTORY: SHX169

## 2017-06-11 ENCOUNTER — Ambulatory Visit: Payer: Self-pay | Admitting: Adult Health

## 2017-06-12 ENCOUNTER — Encounter: Payer: Self-pay | Admitting: Adult Health

## 2017-06-13 NOTE — Progress Notes (Signed)
Assessment and Plan:  Wanda Douglas was seen today for breast problem.  Diagnoses and all orders for this visit:  Prolactin secreting Pituitary Adenoma (1997)/ new headaches/ dizziness Neuro exam unremarkable today; recommended proceed with vision check as previously recommended. 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 Has not had follow up imaging since 05/2015. Bilateral breast dense on exam; right demonstrates notable periareolar density with bilateral milky discharge elicited today-  -     MM Digital Diagnostic Bilat; Future  Will have her schedule a CPE in 3-6 months for follow up.   Further disposition pending results of labs. Discussed med's effects and SE's.   Over 30 minutes of exam, counseling, chart review, and critical decision making was performed.   No future appointments.  ------------------------------------------------------------------------------------------------------------------   HPI BP 110/74   Pulse 87   Temp 98.8 F (37.1 C)   Wt 235 lb (106.6 kg)   SpO2 97%   BMI 41.63 kg/m   47 y.o. AA .female presents for concerns about bloody discharge from breast on the left- single episode when she bent over in the shower last week. - she has similar history of this in 05/2015.  hx/o prolactic producing pituitary ademona circa 1997 (previously on bromocriptine), + hx of milky nipple discharge after discontinuation of medication with episode of bloody discharge in 05/2015 - followed by mammogram, [place] with biopsy revealing diated ducts with intraductal mass which was determined to be papilloma. Was referred  to Dr. Donne Hazel [facility]) - disucssed benign mass, recommended observation with discussion for eventual excision. MRI brain 06/05/2015 demonstrates 6 x 7 x 3 mm hypo enhancing mass lesion along the right anterior inferior aspect of the sella compatible with a a residual or recurrent pituitary microadenoma.   On review of chart, referral to neurosurgery Dr. Deri Fuelling was mentioned in 06/2015 - but she reports she was never evaluated.   She has reported amenorrhea x 9 months during recent visit 04/14/2017 - FSH 6.8, prolactin 52.5 - was advised recheck in 6 months with discussion of reinitiating medications for this at that point.   She is also c/o 2 separate episodes of headaches, as well as 2 episodes of dizziness. These happened 3 weeks ago. She endorses general blurriness of vision, ?worse since she was evaluated here in 04/2017 - was recommended to follow up with vision provider for visual fields testing and plans to do this but hasn't yet.    Past Medical History:  Diagnosis Date  . Asthma   . Pituitary abnormality (HCC)      Allergies  Allergen Reactions  . Banana Hives and Shortness Of Breath  . Shellfish Allergy Hives and Shortness Of Breath  . Peanut-Containing Drug Products Hives    States she is allergic to all types of nuts    Current Outpatient Medications on File Prior to Visit  Medication Sig  . albuterol (PROVENTIL HFA;VENTOLIN HFA) 108 (90 Base) MCG/ACT inhaler 1 to 2 puffs (5 minutes apart) every 4 hour as needed for Asthma  . calcium carbonate (TUMS EX) 750 MG chewable tablet Chew 2 tablets by mouth daily.  . cholecalciferol (VITAMIN  D) 1000 UNITS tablet Take 1,000 Units by mouth daily.  . meloxicam (MOBIC) 15 MG tablet Take one daily with food for 2 weeks, can take with tylenol, can not take with aleve, iburpofen, then as needed daily for pain  . Multiple Vitamin (MULTIVITAMIN) tablet Take 1 tablet by mouth daily.  Marland Kitchen nystatin cream (MYCOSTATIN) Apply 1  application topically 2 (two) times daily.  Marland Kitchen triamcinolone cream (KENALOG) 0.5 % Apply 1 application topically 2 (two) times daily.  . fluconazole (DIFLUCAN) 150 MG tablet Take 1 tablet (150 mg total) by mouth daily. (Patient not taking: Reported on 06/14/2017)  . lidocaine (XYLOCAINE) 2 % solution Use as directed 15 mLs in the mouth or throat every 6 (six) hours as needed for mouth pain. Gargle and spit every 6hrs as needed for sore throat relief. (Patient not taking: Reported on 06/14/2017)   No current facility-administered medications on file prior to visit.     ROS: Review of Systems  Constitutional: Negative for malaise/fatigue and weight loss.  HENT: Negative for hearing loss and tinnitus.   Eyes: Positive for blurred vision. Negative for double vision.  Respiratory: Negative for cough, shortness of breath and wheezing.   Cardiovascular: Negative for chest pain, palpitations, orthopnea, claudication and leg swelling.  Gastrointestinal: Negative for abdominal pain, blood in stool, constipation, diarrhea, heartburn, melena, nausea and vomiting.  Genitourinary: Negative.   Musculoskeletal: Negative for joint pain and myalgias.  Skin: Negative for rash.  Neurological: Positive for dizziness (2 episodes 2 weeks ago) and headaches (2 episodes 3 weeks ago). Negative for tingling, sensory change and weakness.  Endo/Heme/Allergies: Negative for polydipsia.  Psychiatric/Behavioral: Negative.   All other systems reviewed and are negative.    Physical Exam:  BP 110/74   Pulse 87   Temp 98.8 F (37.1 C)   Wt 235 lb (106.6 kg)   SpO2 97%   BMI 41.63 kg/m   General Appearance: Well nourished, in no apparent distress. Eyes: PERRLA, EOMs, conjunctiva no swelling or erythema Sinuses: No Frontal/maxillary tenderness ENT/Mouth: Ext aud canals clear, TMs without erythema, bulging. No erythema, swelling, or exudate on post pharynx.  Tonsils not swollen or erythematous. Hearing normal.  Neck:  Supple, thyroid normal.  Respiratory: Respiratory effort normal, BS equal bilaterally without rales, rhonchi, wheezing or stridor.  Cardio: RRR with no MRGs. Brisk peripheral pulses without edema.  Abdomen: Soft, + BS.  Non tender, no guarding, rebound, hernias, masses. Lymphatics: Non tender without lymphadenopathy.  Musculoskeletal: Full ROM, 5/5 strength, normal gait.  Skin: Warm, dry without rashes, lesions, ecchymosis.  Neuro: Cranial nerves intact. Normal muscle tone, no cerebellar symptoms. Sensation intact. Peripheral vision intact bilaterally.  Psych: Awake and oriented X 3, normal affect, Insight and Judgment appropriate.     Izora Ribas, NP 4:46 PM [facility]

## 2017-06-14 ENCOUNTER — Encounter: Payer: Self-pay | Admitting: Adult Health

## 2017-06-14 ENCOUNTER — Ambulatory Visit: Payer: BC Managed Care – PPO | Admitting: Adult Health

## 2017-06-14 VITALS — BP 110/74 | HR 87 | Temp 98.8°F | Wt 235.0 lb

## 2017-06-14 DIAGNOSIS — D352 Benign neoplasm of pituitary gland: Secondary | ICD-10-CM | POA: Diagnosis not present

## 2017-06-14 DIAGNOSIS — D369 Benign neoplasm, unspecified site: Secondary | ICD-10-CM | POA: Diagnosis not present

## 2017-06-14 DIAGNOSIS — R519 Headache, unspecified: Secondary | ICD-10-CM

## 2017-06-14 DIAGNOSIS — R42 Dizziness and giddiness: Secondary | ICD-10-CM

## 2017-06-14 DIAGNOSIS — R51 Headache: Secondary | ICD-10-CM

## 2017-06-14 DIAGNOSIS — N6452 Nipple discharge: Secondary | ICD-10-CM

## 2017-06-14 MED ORDER — CETIRIZINE HCL 10 MG PO TABS: 10.0000 mg | ORAL_TABLET | Freq: Every day | ORAL | 1 refills | Status: DC

## 2017-06-14 NOTE — Patient Instructions (Addendum)
   Please schedule to get your eyes checked by provider -  Will refer to endocrinology as discussed  Please schedule screening mammogram - You have quite fibrous breasts, so 3D mammogram may be worth investing in    [facility]  7 a.m.-6:30 p.m., Monday 7 a.m.-5 p.m., Tuesday-Friday Schedule an appointment by calling [phone].  [facility] Schedule an appointment by calling [phone].

## 2017-06-15 ENCOUNTER — Encounter: Payer: Self-pay | Admitting: Internal Medicine

## 2017-06-16 ENCOUNTER — Encounter: Payer: Self-pay | Admitting: Adult Health

## 2017-06-16 ENCOUNTER — Other Ambulatory Visit: Payer: Self-pay | Admitting: Adult Health

## 2017-06-16 DIAGNOSIS — D369 Benign neoplasm, unspecified site: Secondary | ICD-10-CM | POA: Insufficient documentation

## 2017-06-16 DIAGNOSIS — N6452 Nipple discharge: Secondary | ICD-10-CM

## 2017-06-24 ENCOUNTER — Ambulatory Visit
Admission: RE | Admit: 2017-06-24 | Discharge: 2017-06-24 | Disposition: A | Discharge status: Home / Self Care | Payer: BC Managed Care – PPO | Source: Ambulatory Visit | Attending: Adult Health | Admitting: Adult Health

## 2017-06-24 ENCOUNTER — Other Ambulatory Visit: Payer: Self-pay | Admitting: Adult Health

## 2017-06-24 DIAGNOSIS — N6452 Nipple discharge: Secondary | ICD-10-CM

## 2017-06-24 DIAGNOSIS — D352 Benign neoplasm of pituitary gland: Secondary | ICD-10-CM

## 2017-06-24 DIAGNOSIS — N6310 Unspecified lump in the right breast, unspecified quadrant: Secondary | ICD-10-CM

## 2017-06-24 DIAGNOSIS — R599 Enlarged lymph nodes, unspecified: Secondary | ICD-10-CM

## 2017-06-24 DIAGNOSIS — D369 Benign neoplasm, unspecified site: Secondary | ICD-10-CM

## 2017-07-01 ENCOUNTER — Ambulatory Visit
Admission: RE | Admit: 2017-07-01 | Discharge: 2017-07-01 | Disposition: A | Discharge status: Home / Self Care | Payer: BC Managed Care – PPO | Source: Ambulatory Visit | Attending: Adult Health | Admitting: Adult Health

## 2017-07-01 ENCOUNTER — Other Ambulatory Visit: Payer: Self-pay | Admitting: Adult Health

## 2017-07-01 DIAGNOSIS — N6452 Nipple discharge: Secondary | ICD-10-CM

## 2017-07-01 DIAGNOSIS — R599 Enlarged lymph nodes, unspecified: Secondary | ICD-10-CM

## 2017-07-01 DIAGNOSIS — N6310 Unspecified lump in the right breast, unspecified quadrant: Secondary | ICD-10-CM

## 2017-07-02 DIAGNOSIS — N6452 Nipple discharge: Secondary | ICD-10-CM

## 2017-07-02 DIAGNOSIS — N6320 Unspecified lump in the left breast, unspecified quadrant: Secondary | ICD-10-CM

## 2017-07-02 HISTORY — DX: Unspecified lump in the left breast, unspecified quadrant: N63.20

## 2017-07-02 HISTORY — DX: Nipple discharge: N64.52

## 2017-07-16 ENCOUNTER — Other Ambulatory Visit: Payer: Self-pay | Admitting: General Surgery

## 2017-07-20 ENCOUNTER — Other Ambulatory Visit: Payer: Self-pay | Admitting: General Surgery

## 2017-07-20 ENCOUNTER — Encounter (HOSPITAL_BASED_OUTPATIENT_CLINIC_OR_DEPARTMENT_OTHER): Payer: Self-pay | Admitting: *Deleted

## 2017-07-20 ENCOUNTER — Other Ambulatory Visit: Payer: Self-pay

## 2017-07-20 DIAGNOSIS — N6452 Nipple discharge: Secondary | ICD-10-CM

## 2017-07-22 NOTE — Progress Notes (Signed)
Ensure pre surgery drink given with instructions to complete by 0415 dos, pt verbalized understanding. 

## 2017-07-23 ENCOUNTER — Ambulatory Visit
Admission: RE | Admit: 2017-07-23 | Discharge: 2017-07-23 | Disposition: A | Discharge status: Home / Self Care | Payer: BC Managed Care – PPO | Source: Ambulatory Visit | Attending: General Surgery | Admitting: General Surgery

## 2017-07-23 DIAGNOSIS — N6452 Nipple discharge: Secondary | ICD-10-CM

## 2017-07-24 ENCOUNTER — Encounter: Payer: Self-pay | Admitting: Adult Health

## 2017-07-26 ENCOUNTER — Ambulatory Visit
Admission: RE | Admit: 2017-07-26 | Discharge: 2017-07-26 | Disposition: A | Discharge status: Home / Self Care | Payer: BC Managed Care – PPO | Source: Ambulatory Visit | Attending: General Surgery | Admitting: General Surgery

## 2017-07-26 ENCOUNTER — Ambulatory Visit (HOSPITAL_BASED_OUTPATIENT_CLINIC_OR_DEPARTMENT_OTHER): Payer: BC Managed Care – PPO | Admitting: Certified Registered"

## 2017-07-26 ENCOUNTER — Encounter (HOSPITAL_BASED_OUTPATIENT_CLINIC_OR_DEPARTMENT_OTHER): Payer: Self-pay | Admitting: Anesthesiology

## 2017-07-26 ENCOUNTER — Ambulatory Visit (HOSPITAL_BASED_OUTPATIENT_CLINIC_OR_DEPARTMENT_OTHER)
Admission: RE | Admit: 2017-07-26 | Discharge: 2017-07-26 | Disposition: A | Discharge status: Home / Self Care | Payer: BC Managed Care – PPO | Source: Ambulatory Visit | Attending: General Surgery | Admitting: General Surgery

## 2017-07-26 ENCOUNTER — Encounter (HOSPITAL_BASED_OUTPATIENT_CLINIC_OR_DEPARTMENT_OTHER)
Admission: RE | Disposition: A | Discharge status: Home / Self Care | Payer: Self-pay | Source: Ambulatory Visit | Attending: General Surgery

## 2017-07-26 ENCOUNTER — Other Ambulatory Visit: Payer: Self-pay

## 2017-07-26 DIAGNOSIS — N6022 Fibroadenosis of left breast: Secondary | ICD-10-CM | POA: Insufficient documentation

## 2017-07-26 DIAGNOSIS — N6452 Nipple discharge: Secondary | ICD-10-CM | POA: Insufficient documentation

## 2017-07-26 DIAGNOSIS — D242 Benign neoplasm of left breast: Secondary | ICD-10-CM | POA: Diagnosis not present

## 2017-07-26 DIAGNOSIS — Z79899 Other long term (current) drug therapy: Secondary | ICD-10-CM | POA: Diagnosis not present

## 2017-07-26 DIAGNOSIS — Z803 Family history of malignant neoplasm of breast: Secondary | ICD-10-CM | POA: Diagnosis not present

## 2017-07-26 DIAGNOSIS — N643 Galactorrhea not associated with childbirth: Secondary | ICD-10-CM | POA: Insufficient documentation

## 2017-07-26 DIAGNOSIS — J45909 Unspecified asthma, uncomplicated: Secondary | ICD-10-CM | POA: Insufficient documentation

## 2017-07-26 DIAGNOSIS — Z6841 Body Mass Index (BMI) 40.0 and over, adult: Secondary | ICD-10-CM | POA: Diagnosis not present

## 2017-07-26 DIAGNOSIS — D352 Benign neoplasm of pituitary gland: Secondary | ICD-10-CM | POA: Diagnosis not present

## 2017-07-26 HISTORY — DX: Irregular menstruation, unspecified: N92.6

## 2017-07-26 HISTORY — PX: BREAST EXCISIONAL BIOPSY: SUR124

## 2017-07-26 HISTORY — DX: Benign neoplasm of pituitary gland: D35.2

## 2017-07-26 HISTORY — PX: BREAST DUCTAL SYSTEM EXCISION: SHX5242

## 2017-07-26 HISTORY — DX: Unspecified lump in the left breast, unspecified quadrant: N63.20

## 2017-07-26 HISTORY — PX: RADIOACTIVE SEED GUIDED EXCISIONAL BREAST BIOPSY: SHX6490

## 2017-07-26 HISTORY — DX: Nipple discharge: N64.52

## 2017-07-26 SURGERY — RADIOACTIVE SEED GUIDED BREAST BIOPSY
Anesthesia: General | Site: Breast | Laterality: Left

## 2017-07-26 MED ORDER — ACETAMINOPHEN 500 MG PO TABS
ORAL_TABLET | ORAL | Status: AC
  Filled 2017-07-26: qty 2

## 2017-07-26 MED ORDER — CEFAZOLIN SODIUM-DEXTROSE 2-4 GM/100ML-% IV SOLN: 2.0000 g | INTRAVENOUS | Status: DC

## 2017-07-26 MED ORDER — PROPOFOL 10 MG/ML IV BOLUS
INTRAVENOUS | Status: AC
  Filled 2017-07-26: qty 20

## 2017-07-26 MED ORDER — CEFAZOLIN SODIUM-DEXTROSE 2-4 GM/100ML-% IV SOLN
INTRAVENOUS | Status: AC
  Filled 2017-07-26: qty 100

## 2017-07-26 MED ORDER — ENSURE PRE-SURGERY PO LIQD: 296.0000 mL | Freq: Once | ORAL | Status: DC

## 2017-07-26 MED ORDER — BUPIVACAINE HCL (PF) 0.25 % IJ SOLN
INTRAMUSCULAR | Status: AC
  Filled 2017-07-26: qty 30

## 2017-07-26 MED ORDER — GABAPENTIN 300 MG PO CAPS
ORAL_CAPSULE | ORAL | Status: AC
Start: 2017-07-26 — End: ?
  Filled 2017-07-26: qty 1

## 2017-07-26 MED ORDER — PROPOFOL 10 MG/ML IV BOLUS
INTRAVENOUS | Status: DC | PRN
  Administered 2017-07-26: 150 mg via INTRAVENOUS

## 2017-07-26 MED ORDER — FENTANYL CITRATE (PF) 100 MCG/2ML IJ SOLN
INTRAMUSCULAR | Status: DC | PRN
  Administered 2017-07-26: 100 ug via INTRAVENOUS

## 2017-07-26 MED ORDER — MIDAZOLAM HCL 2 MG/2ML IJ SOLN: 1.0000 mg | INTRAMUSCULAR | Status: DC | PRN

## 2017-07-26 MED ORDER — FENTANYL CITRATE (PF) 100 MCG/2ML IJ SOLN: 50.0000 ug | INTRAMUSCULAR | Status: DC | PRN

## 2017-07-26 MED ORDER — MIDAZOLAM HCL 5 MG/5ML IJ SOLN
INTRAMUSCULAR | Status: DC | PRN
  Administered 2017-07-26: 2 mg via INTRAVENOUS

## 2017-07-26 MED ORDER — DEXAMETHASONE SODIUM PHOSPHATE 10 MG/ML IJ SOLN
INTRAMUSCULAR | Status: AC
  Filled 2017-07-26: qty 1

## 2017-07-26 MED ORDER — CHLORHEXIDINE GLUCONATE CLOTH 2 % EX PADS: 6.0000 | MEDICATED_PAD | Freq: Once | CUTANEOUS | Status: DC

## 2017-07-26 MED ORDER — BUPIVACAINE HCL (PF) 0.25 % IJ SOLN
INTRAMUSCULAR | Status: DC | PRN
Start: 2017-07-26 — End: 2017-07-26
  Administered 2017-07-26: 10 mL

## 2017-07-26 MED ORDER — FENTANYL CITRATE (PF) 100 MCG/2ML IJ SOLN
INTRAMUSCULAR | Status: AC
  Filled 2017-07-26: qty 2

## 2017-07-26 MED ORDER — IBUPROFEN 600 MG PO TABS: 600.0000 mg | ORAL_TABLET | Freq: Three times a day (TID) | ORAL | 2 refills | Status: DC | PRN

## 2017-07-26 MED ORDER — CELECOXIB 400 MG PO CAPS
400.0000 mg | ORAL_CAPSULE | ORAL | Status: AC
  Administered 2017-07-26: 400 mg via ORAL

## 2017-07-26 MED ORDER — LIDOCAINE HCL (CARDIAC) 20 MG/ML IV SOLN
INTRAVENOUS | Status: DC | PRN
  Administered 2017-07-26: 30 mg via INTRAVENOUS

## 2017-07-26 MED ORDER — LACTATED RINGERS IV SOLN
INTRAVENOUS | Status: DC
  Administered 2017-07-26: 07:00:00 via INTRAVENOUS

## 2017-07-26 MED ORDER — GABAPENTIN 300 MG PO CAPS
300.0000 mg | ORAL_CAPSULE | ORAL | Status: AC
  Administered 2017-07-26: 300 mg via ORAL

## 2017-07-26 MED ORDER — SCOPOLAMINE 1 MG/3DAYS TD PT72: 1.0000 | MEDICATED_PATCH | Freq: Once | TRANSDERMAL | Status: DC | PRN

## 2017-07-26 MED ORDER — CELECOXIB 200 MG PO CAPS
ORAL_CAPSULE | ORAL | Status: AC
  Filled 2017-07-26: qty 2

## 2017-07-26 MED ORDER — ACETAMINOPHEN 500 MG PO TABS
1000.0000 mg | ORAL_TABLET | ORAL | Status: AC
  Administered 2017-07-26: 1000 mg via ORAL

## 2017-07-26 MED ORDER — DEXAMETHASONE SODIUM PHOSPHATE 4 MG/ML IJ SOLN
INTRAMUSCULAR | Status: DC | PRN
  Administered 2017-07-26: 10 mg via INTRAVENOUS

## 2017-07-26 MED ORDER — CEFAZOLIN SODIUM-DEXTROSE 2-4 GM/100ML-% IV SOLN
2.0000 g | INTRAVENOUS | Status: AC
  Administered 2017-07-26: 2 g via INTRAVENOUS

## 2017-07-26 MED ORDER — MIDAZOLAM HCL 2 MG/2ML IJ SOLN
INTRAMUSCULAR | Status: AC
  Filled 2017-07-26: qty 2

## 2017-07-26 MED ORDER — ONDANSETRON HCL 4 MG/2ML IJ SOLN
INTRAMUSCULAR | Status: AC
  Filled 2017-07-26: qty 2

## 2017-07-26 MED ORDER — ONDANSETRON HCL 4 MG/2ML IJ SOLN
INTRAMUSCULAR | Status: DC | PRN
  Administered 2017-07-26: 4 mg via INTRAVENOUS

## 2017-07-26 MED ORDER — LIDOCAINE HCL (CARDIAC) 20 MG/ML IV SOLN
INTRAVENOUS | Status: AC
  Filled 2017-07-26: qty 5

## 2017-07-26 MED ORDER — LACTATED RINGERS IV SOLN
INTRAVENOUS | Status: DC | PRN
  Administered 2017-07-26 (×2): via INTRAVENOUS

## 2017-07-26 SURGICAL SUPPLY — 52 items
ADH SKN CLS APL DERMABOND .7 (GAUZE/BANDAGES/DRESSINGS) ×1
BINDER BREAST XXLRG (GAUZE/BANDAGES/DRESSINGS) ×2 IMPLANT
BLADE SURG 15 STRL LF DISP TIS (BLADE) ×1 IMPLANT
BLADE SURG 15 STRL SS (BLADE) ×3
CANISTER SUCT 1200ML W/VALVE (MISCELLANEOUS) ×2 IMPLANT
CHLORAPREP W/TINT 26ML (MISCELLANEOUS) ×3 IMPLANT
CLIP VESOCCLUDE SM WIDE 6/CT (CLIP) ×2 IMPLANT
CLOSURE WOUND 1/2 X4 (GAUZE/BANDAGES/DRESSINGS) ×1
COVER BACK TABLE 60X90IN (DRAPES) ×3 IMPLANT
COVER MAYO STAND STRL (DRAPES) ×3 IMPLANT
COVER PROBE W GEL 5X96 (DRAPES) ×3 IMPLANT
DERMABOND ADVANCED (GAUZE/BANDAGES/DRESSINGS) ×2
DERMABOND ADVANCED .7 DNX12 (GAUZE/BANDAGES/DRESSINGS) ×1 IMPLANT
DEVICE DUBIN W/COMP PLATE 8390 (MISCELLANEOUS) ×3 IMPLANT
DRAPE LAPAROSCOPIC ABDOMINAL (DRAPES) ×3 IMPLANT
DRAPE UTILITY XL STRL (DRAPES) ×3 IMPLANT
DRSG TEGADERM 4X4.75 (GAUZE/BANDAGES/DRESSINGS) ×3 IMPLANT
ELECT COATED BLADE 2.86 ST (ELECTRODE) ×3 IMPLANT
ELECT REM PT RETURN 9FT ADLT (ELECTROSURGICAL) ×3
ELECTRODE REM PT RTRN 9FT ADLT (ELECTROSURGICAL) ×1 IMPLANT
GAUZE SPONGE 4X4 12PLY STRL LF (GAUZE/BANDAGES/DRESSINGS) ×3 IMPLANT
GLOVE BIO SURGEON STRL SZ 6.5 (GLOVE) ×1 IMPLANT
GLOVE BIO SURGEON STRL SZ7 (GLOVE) ×6 IMPLANT
GLOVE BIO SURGEONS STRL SZ 6.5 (GLOVE) ×1
GLOVE BIOGEL PI IND STRL 7.0 (GLOVE) IMPLANT
GLOVE BIOGEL PI IND STRL 7.5 (GLOVE) ×1 IMPLANT
GLOVE BIOGEL PI INDICATOR 7.0 (GLOVE) ×2
GLOVE BIOGEL PI INDICATOR 7.5 (GLOVE) ×2
GOWN STRL REUS W/ TWL LRG LVL3 (GOWN DISPOSABLE) ×3 IMPLANT
GOWN STRL REUS W/TWL LRG LVL3 (GOWN DISPOSABLE) ×9
KIT MARKER MARGIN INK (KITS) ×3 IMPLANT
NDL HYPO 25X1 1.5 SAFETY (NEEDLE) ×1 IMPLANT
NEEDLE HYPO 25X1 1.5 SAFETY (NEEDLE) ×3 IMPLANT
NS IRRIG 1000ML POUR BTL (IV SOLUTION) ×2 IMPLANT
PACK BASIN DAY SURGERY FS (CUSTOM PROCEDURE TRAY) ×3 IMPLANT
PENCIL BUTTON HOLSTER BLD 10FT (ELECTRODE) ×3 IMPLANT
SLEEVE SCD COMPRESS KNEE MED (MISCELLANEOUS) ×3 IMPLANT
SPONGE LAP 4X18 X RAY DECT (DISPOSABLE) ×3 IMPLANT
STRIP CLOSURE SKIN 1/2X4 (GAUZE/BANDAGES/DRESSINGS) ×2 IMPLANT
SUT MNCRL AB 4-0 PS2 18 (SUTURE) IMPLANT
SUT MON AB 5-0 PS2 18 (SUTURE) IMPLANT
SUT SILK 2 0 SH (SUTURE) ×3 IMPLANT
SUT VIC AB 2-0 SH 27 (SUTURE) ×3
SUT VIC AB 2-0 SH 27XBRD (SUTURE) ×1 IMPLANT
SUT VIC AB 3-0 SH 27 (SUTURE) ×3
SUT VIC AB 3-0 SH 27X BRD (SUTURE) ×1 IMPLANT
SYR CONTROL 10ML LL (SYRINGE) ×3 IMPLANT
TOWEL OR 17X24 6PK STRL BLUE (TOWEL DISPOSABLE) ×3 IMPLANT
TOWEL OR NON WOVEN STRL DISP B (DISPOSABLE) ×3 IMPLANT
TUBE CONNECTING 20'X1/4 (TUBING) ×1
TUBE CONNECTING 20X1/4 (TUBING) ×1 IMPLANT
YANKAUER SUCT BULB TIP NO VENT (SUCTIONS) ×2 IMPLANT

## 2017-07-26 NOTE — Interval H&P Note (Signed)
History and Physical Interval Note:  07/26/2017 7:18 AM  Wanda Douglas  has presented today for surgery, with the diagnosis of bloody nipple discharge and mass  The various methods of treatment have been discussed with the patient and family. After consideration of risks, benefits and other options for treatment, the patient has consented to  Procedure(s): LEFT RADIOACTIVE SEED GUIDED EXCISIONAL BREAST BIOPSY ERAS PATHWAY (Left) EXCISION DUCTAL SYSTEM BREAST (Left) as a surgical intervention .  The patient's history has been reviewed, patient examined, no change in status, stable for surgery.  I have reviewed the patient's chart and labs.  Questions were answered to the patient's satisfaction.     Rolm Bookbinder

## 2017-07-26 NOTE — Anesthesia Preprocedure Evaluation (Signed)
Anesthesia Evaluation  Patient identified by MRN, date of birth, ID band Patient awake    Reviewed: Allergy & Precautions, NPO status , Patient's Chart, lab work & pertinent test results  Airway Mallampati: II  TM Distance: >3 FB Neck ROM: Full    Dental  (+) Teeth Intact, Dental Advisory Given   Pulmonary asthma ,    Pulmonary exam normal breath sounds clear to auscultation       Cardiovascular negative cardio ROS Normal cardiovascular exam Rhythm:Regular Rate:Normal     Neuro/Psych Pituitary adenoma negative neurological ROS  negative psych ROS   GI/Hepatic negative GI ROS, Neg liver ROS,   Endo/Other  Morbid obesity  Renal/GU negative Renal ROS     Musculoskeletal negative musculoskeletal ROS (+)   Abdominal   Peds  Hematology negative hematology ROS (+)   Anesthesia Other Findings Day of surgery medications reviewed with the patient.  Reproductive/Obstetrics negative OB ROS                             Anesthesia Physical Anesthesia Plan  ASA: II  Anesthesia Plan: General   Post-op Pain Management:    Induction: Intravenous  PONV Risk Score and Plan: 3 and Midazolam, Dexamethasone and Ondansetron  Airway Management Planned: LMA  Additional Equipment:   Intra-op Plan:   Post-operative Plan: Extubation in OR  Informed Consent: I have reviewed the patients History and Physical, chart, labs and discussed the procedure including the risks, benefits and alternatives for the proposed anesthesia with the patient or authorized representative who has indicated his/her understanding and acceptance.   Dental advisory given  Plan Discussed with: CRNA  Anesthesia Plan Comments:         Anesthesia Quick Evaluation

## 2017-07-26 NOTE — Transfer of Care (Signed)
Immediate Anesthesia Transfer of Care Note  Patient: Wanda Douglas  Procedure(s) Performed: LEFT RADIOACTIVE SEED GUIDED EXCISIONAL BREAST BIOPSY ERAS PATHWAY (Left Breast) EXCISION DUCTAL SYSTEM BREAST (Left Breast)  Patient Location: PACU  Anesthesia Type:General  Level of Consciousness: sedated  Airway & Oxygen Therapy: Patient Spontanous Breathing and Patient connected to face mask oxygen  Post-op Assessment: Report given to RN and Post -op Vital signs reviewed and stable  Post vital signs: Reviewed and stable  Last Vitals:  Vitals Value Taken Time  BP    Temp    Pulse    Resp 13 07/26/2017  8:33 AM  SpO2    Vitals shown include unvalidated device data.  Last Pain:  Vitals:   07/26/17 0719  TempSrc: Oral  PainSc: 0-No pain         Complications: No apparent anesthesia complications

## 2017-07-26 NOTE — Anesthesia Postprocedure Evaluation (Signed)
Anesthesia Post Note  Patient: Wanda Douglas  Procedure(s) Performed: LEFT RADIOACTIVE SEED GUIDED EXCISIONAL BREAST BIOPSY ERAS PATHWAY (Left Breast) EXCISION DUCTAL SYSTEM BREAST (Left Breast)     Patient location during evaluation: PACU Anesthesia Type: General Level of consciousness: awake and alert Pain management: pain level controlled Vital Signs Assessment: post-procedure vital signs reviewed and stable Respiratory status: spontaneous breathing, nonlabored ventilation, respiratory function stable and patient connected to nasal cannula oxygen Cardiovascular status: blood pressure returned to baseline and stable Postop Assessment: no apparent nausea or vomiting Anesthetic complications: no    Last Vitals:  Vitals:   07/26/17 0915 07/26/17 0945  BP: 110/83 121/80  Pulse: (!) 52 (!) 56  Resp: 16 18  Temp:  36.4 C  SpO2: 99% 99%    Last Pain:  Vitals:   07/26/17 0945  TempSrc:   PainSc: 0-No pain                 Montez Hageman

## 2017-07-26 NOTE — Anesthesia Procedure Notes (Signed)
Procedure Name: LMA Insertion Date/Time: 07/26/2017 7:46 AM Performed by: Marrianne Mood, CRNA Pre-anesthesia Checklist: Patient identified, Emergency Drugs available, Suction available, Patient being monitored and Timeout performed Patient Re-evaluated:Patient Re-evaluated prior to induction Oxygen Delivery Method: Circle system utilized Preoxygenation: Pre-oxygenation with 100% oxygen Induction Type: IV induction Ventilation: Mask ventilation without difficulty LMA: LMA inserted LMA Size: 4.0 Number of attempts: 1 Airway Equipment and Method: Bite block Placement Confirmation: positive ETCO2 Tube secured with: Tape Dental Injury: Teeth and Oropharynx as per pre-operative assessment

## 2017-07-26 NOTE — Discharge Instructions (Signed)
[place] Office Phone Number [phone]  POST OP INSTRUCTIONS  Always review your discharge instruction sheet given to you by the facility where your surgery was performed.  IF YOU HAVE DISABILITY OR FAMILY LEAVE FORMS, YOU MUST BRING THEM TO THE OFFICE FOR PROCESSING.  DO NOT GIVE THEM TO YOUR DOCTOR.  1. A prescription for pain medication may be given to you upon discharge.  Take your pain medication as prescribed, if needed.  If narcotic pain medicine is not needed, then you may take acetaminophen (Tylenol), naprosyn (Alleve) or ibuprofen (Advil) as needed. Next Dose of Tylenol at 1:30pm if needed. 2. Take your usually prescribed medications unless otherwise directed 3. If you need a refill on your pain medication, please contact your pharmacy.  They will contact our office to request authorization.  Prescriptions will not be filled after 5pm or on week-ends. 4. You should eat very light the first 24 hours after surgery, such as soup, crackers, pudding, etc.  Resume your normal diet the day after surgery. 5. Most patients will experience some swelling and bruising in the breast.  Ice packs and a good support bra will help.  Wear the breast binder provided or a sports bra for 72 hours day and night.  After that wear a sports bra during the day until you return to the office. Swelling and bruising can take several days to resolve.  6. It is common to experience some constipation if taking pain medication after surgery.  Increasing fluid intake and taking a stool softener will usually help or prevent this problem from occurring.  A mild laxative (Milk of Magnesia or Miralax) should be taken according to package directions if there are no bowel movements after 48 hours. 7. Unless discharge instructions indicate otherwise, you may remove your bandages 48 hours after surgery and you may shower at that time.  You may have steri-strips (small skin tapes) in place directly over the  incision.  These strips should be left on the skin for 7-10 days and will come off on their own.  If your surgeon used skin glue on the incision, you may shower in 24 hours.  The glue will flake off over the next 2-3 weeks.  Any sutures or staples will be removed at the office during your follow-up visit. 8. ACTIVITIES:  You may resume regular daily activities (gradually increasing) beginning the next day.  Wearing a good support bra or sports bra minimizes pain and swelling.  You may have sexual intercourse when it is comfortable. a. You may drive when you no longer are taking prescription pain medication, you can comfortably wear a seatbelt, and you can safely maneuver your car and apply brakes. b. RETURN TO WORK:  ______________________________________________________________________________________ 9. You should see your doctor in the office for a follow-up appointment approximately two weeks after your surgery.  Your doctors nurse will typically make your follow-up appointment when she calls you with your pathology report.  Expect your pathology report 3-4 business days after your surgery.  You may call to check if you do not hear from [place] after three days. 10. OTHER INSTRUCTIONS: _______________________________________________________________________________________________ _____________________________________________________________________________________________________________________________________ _____________________________________________________________________________________________________________________________________ _____________________________________________________________________________________________________________________________________  WHEN TO CALL DR WAKEFIELD: 1. Fever over 101.0 2. Nausea and/or vomiting. 3. Extreme swelling or bruising. 4. Continued bleeding from incision. 5. Increased pain, redness, or drainage from the incision.  The clinic staff is  available to answer your questions during regular business hours.  Please dont hesitate to call and ask to speak to one  of the nurses for clinical concerns.  If you have a medical emergency, go to the nearest emergency room or call 911.  A surgeon from [facility] is always on call at the hospital.  For further questions, please visit [email or web address]   Post Anesthesia Home Care Instructions  Activity: Get plenty of rest for the remainder of the day. A responsible individual must stay with you for 24 hours following the procedure.  For the next 24 hours, DO NOT: -Drive a car -Paediatric nurse -Drink alcoholic beverages -Take any medication unless instructed by your physician -Make any legal decisions or sign important papers.  Meals: Start with liquid foods such as gelatin or soup. Progress to regular foods as tolerated. Avoid greasy, spicy, heavy foods. If nausea and/or vomiting occur, drink only clear liquids until the nausea and/or vomiting subsides. Call your physician if vomiting continues.  Special Instructions/Symptoms: Your throat may feel dry or sore from the anesthesia or the breathing tube placed in your throat during surgery. If this causes discomfort, gargle with warm salt water. The discomfort should disappear within 24 hours.  If you had a scopolamine patch placed behind your ear for the management of post- operative nausea and/or vomiting:  1. The medication in the patch is effective for 72 hours, after which it should be removed.  Wrap patch in a tissue and discard in the trash. Wash hands thoroughly with soap and water. 2. You may remove the patch earlier than 72 hours if you experience unpleasant side effects which may include dry mouth, dizziness or visual disturbances. 3. Avoid touching the patch. Wash your hands with soap and water after contact with the patch.

## 2017-07-26 NOTE — H&P (Signed)
Wanda Douglas is an 47 y.o. female.   Chief Complaint: left breast mass and bloody dc HPI: 4 yof referred by Dr Melford Aase for left breast bloody nipple dc. she has history of bilateral galactorrhea associated with pituitary adenoma. she has fh of breast cancer in pgm at about age 86. 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 she works as Patent attorney at [employer]. she noted about a month ago bloody nipple dc on left that was spontaneous. this has not been recurring. she also felt like she had a right sided breast mass or masses. she underwent mm that showed d density breasts. on the right she had low density multiloculated nodules that are also seen on left. there is postbiopsy marker on the left also. US showed markedly dilated retroareolar ducts in right breast with possible intraductal mass. there is also 1 borderline abnl node in right axilla with cortical thickening. left breast US shows dilated ducts in the subareolar left breast. the papilloma is not well seen. she underwent biopsy of the right breast with result ectatic duct and node is negative. 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    Past Medical History:  Diagnosis Date  . Asthma   . Bloody discharge from left nipple 07/2017  . Irregular menstrual cycle   . Left breast mass 07/2017  . Pituitary adenoma [facility])     Past Surgical History:  Procedure Laterality Date  . TONSILLECTOMY      Family History  Problem Relation Age of Onset  . Breast cancer Paternal Grandmother    Social History:  reports that she has never smoked. She has never used smokeless tobacco. She reports that she does not drink alcohol or use drugs.  Allergies:  Allergies  Allergen Reactions  . Banana Hives and Shortness Of Breath  . Peanut-Containing Drug Products Hives and Shortness Of Breath  . Shellfish Allergy Hives and Shortness Of Breath    Medications Prior to Admission   Medication Sig Dispense Refill  . cetirizine (ZYRTEC) 10 MG tablet Take 1 tablet (10 mg total) by mouth daily. 90 tablet 1  . albuterol (PROVENTIL HFA;VENTOLIN HFA) 108 (90 Base) MCG/ACT inhaler 1 to 2 puffs (5 minutes apart) every 4 hour as needed for Asthma 18 g 99  . calcium carbonate (TUMS EX) 750 MG chewable tablet Chew 2 tablets by mouth daily.    . cholecalciferol (VITAMIN D) 1000 UNITS tablet Take 1,000 Units by mouth daily.    . fluconazole (DIFLUCAN) 150 MG tablet Take 1 tablet (150 mg total) by mouth daily. (Patient not taking: Reported on 06/14/2017) 1 tablet 3  . lidocaine (XYLOCAINE) 2 % solution Use as directed 15 mLs in the mouth or throat every 6 (six) hours as needed for mouth pain. Gargle and spit every 6hrs as needed for sore throat relief. (Patient not taking: Reported on 06/14/2017) 100 mL 0  . meloxicam (MOBIC) 15 MG tablet Take one daily with food for 2 weeks, can take with tylenol, can not take with aleve, iburpofen, then as needed daily for pain 30 tablet 1  . Multiple Vitamin (MULTIVITAMIN) tablet Take 1 tablet by mouth daily.    Marland Kitchen nystatin cream (MYCOSTATIN) Apply 1 application topically 2 (two) times daily. 60 g 1  . triamcinolone cream (KENALOG) 0.5 % Apply 1 application topically 2 (two) times daily. 80 g 2    No results found for this or any previous visit (  from the past 48 hour(s)). No results found.  Review of Systems  All other systems reviewed and are negative.   Height 5\' 3"  (1.6 m), weight 104.3 kg (230 lb), last menstrual period 07/16/2017. Physical Exam  Vitals (Tanisha A. Brown RMA; 07/16/2017 9:33 AM) 07/16/2017 9:32 AM Weight: 230.6 lb Height: 63in Body Surface Area: 2.05 m Body Mass Index: 40.85 kg/m  Temp.: 98.65F  Pulse: 78 (Regular)  BP: 116/72 (Sitting, Left Arm, Standard) Physical Exam Rolm Bookbinder MD; 07/16/2017 11:06 AM) General Mental Status-Alert. Orientation-Oriented X3. Head and  Neck Trachea-midline. Thyroid Gland Characteristics - normal size and consistency. Eye Sclera/Conjunctiva - Bilateral-No scleral icterus. Chest and Lung Exam Chest and lung exam reveals -quiet, even and easy respiratory effort with no use of accessory muscles and on auscultation, normal breath sounds, no adventitious sounds and normal vocal resonance. Breast Nipples Discharge - Left - Pearline Cables. Right - None. Breast Lump-No Palpable Breast Mass. Cardiovascular Cardiovascular examination reveals -normal heart sounds, regular rate and rhythm with no murmurs. Lymphatic Head & Neck General Head & Neck Lymphatics: Bilateral - Description - Normal. Axillary General Axillary Region: Bilateral - Description - Normal. Note: no [place] adenopathy   Assessment/Plan BLOODY DISCHARGE FROM LEFT NIPPLE (N64.52) Story: left breast seed guided excision/duct excision I discussed that due to bloody discharge that I think reasonable to excise prior papilloma and ductal system. I dont think needs mri. we discussed seed placement, surgery and recovery. will proceed   Rolm Bookbinder, MD 07/26/2017, 7:16 AM

## 2017-07-26 NOTE — Op Note (Signed)
Signed         Preoperative diagnoses:left breast mass on [place] with nipple discharge, core biopsy with papilloma Postoperative diagnosis: Same as above Procedure:Leftbreastseed guided excisional biopsy Surgeon: Dr. Serita Grammes Anesthesia: Gen. Estimated blood loss: minimal Complications: None Drains: None Specimens:leftbreast tissue marked with paint Sponge and needle count correct at completion Disposition to recovery stable  Indications: This is a5 yof with galactorrhea who now has left breast bloody nipple dc with prior biopsy of a mass that was a papilloma.  We discussed options and with bloody nipple dc and the mass I think reasonable to proceed with excisional biopsy. She is at higher risk postop due to galactorrhea but this has not been high volume. She had a seed placed prior to beginning and this was not at the clip which had migrated.   Procedure: After informed consent was obtained she was then taken to the operating room. She was given antibiotics.Sequential compression devices were on her legs. She was placed under general anesthesia without complication. Her chestwas then prepped and draped in the standard sterile surgical fashion. A surgical timeout was then performed.   The seed was in thesubareolar left breast. I made aperiareolar incision to hide the scar. I infiltrated marcaine throughout the area.she had no bloody discharge but had whitish discharge from multiple ducts.   I then used the neoprobe to guide excision of the seed and the surrounding tissue.  This was then all sent to pathology. Hemostasis was observed. I closed the breast tissue with a 2-0 Vicryl. The dermis was closed with 3-0 Vicryl and the skin with 5-0 Monocryl.Dermabond and steristrips were placed on the incision. A breast binder was placed. She was transferred to recovery stable

## 2017-07-27 ENCOUNTER — Encounter (HOSPITAL_BASED_OUTPATIENT_CLINIC_OR_DEPARTMENT_OTHER): Payer: Self-pay | Admitting: General Surgery

## 2017-07-27 ENCOUNTER — Ambulatory Visit: Payer: BC Managed Care – PPO | Admitting: Internal Medicine

## 2017-07-27 DIAGNOSIS — Z0289 Encounter for other administrative examinations: Secondary | ICD-10-CM

## 2017-08-03 NOTE — Progress Notes (Signed)
Assessment and Plan:  Wanda Douglas was seen today for rash.  Diagnoses and all orders for this visit:  Perineal rash in female/Candida rash of groin Continue with nystatin, triamcinolone topically, start on probiotic, discussed needs to air out and avoid moist/damp skin - wear cotton boxers or slip while at home to expose to air Ongoing for 6 months and poorly responding to typical treatment, after discussion with patient will refer to GYN for evaluation and treatment as recommended. In the meantime will trial longer course of diflucan -  -     fluconazole (DIFLUCAN) 150 MG tablet; Take 1 tab by mouth once weekly for 3 weeks for yeast rash. -     Ambulatory referral to Gynecology  Further disposition pending results of labs. Discussed med's effects and SE's.   Over 15 minutes of exam, counseling, chart review, and critical decision making was performed.   Future Appointments  Date Time Provider [place]  10/19/2017  2:00 PM Liane Comber, NP [facility] None    ------------------------------------------------------------------------------------------------------------------   HPI BP 110/72   Pulse 77   Temp (!) 97.5 F (36.4 C)   Wt 236 lb (107 kg)   LMP 07/16/2017 Comment: irregular periods  SpO2 97%   BMI 41.81 kg/m   46 y.o.AA obese female presents for concerns of rash in her groin ongoing for 6 months. She has been prescribed a single oral dose of diflucan, topical nystatin cream as well as triamcinolone cream and area has reportedly not improved. She endorses mild burning sensation of the area. Denies fever/chills, dysuria, urine changes, vaginal discharge or lesions. She has never been sexually active, denies any sexual contact.   Past Medical History:  Diagnosis Date  . Asthma   . Bloody discharge from left nipple 07/2017  . Irregular menstrual cycle   . Left breast mass 07/2017  . Pituitary adenoma (HCC)      Allergies  Allergen Reactions  . Banana Hives  and Shortness Of Breath  . Peanut-Containing Drug Products Hives and Shortness Of Breath  . Shellfish Allergy Hives and Shortness Of Breath    Current Outpatient Medications on File Prior to Visit  Medication Sig  . albuterol (PROVENTIL HFA;VENTOLIN HFA) 108 (90 Base) MCG/ACT inhaler 1 to 2 puffs (5 minutes apart) every 4 hour as needed for Asthma  . calcium carbonate (TUMS EX) 750 MG chewable tablet Chew 2 tablets by mouth daily.  . cetirizine (ZYRTEC) 10 MG tablet Take 1 tablet (10 mg total) by mouth daily.  . cholecalciferol (VITAMIN D) 1000 UNITS tablet Take 1,000 Units by mouth daily.  Marland Kitchen ibuprofen (ADVIL,MOTRIN) 600 MG tablet Take 1 tablet (600 mg total) by mouth every 8 (eight) hours as needed for moderate pain (would use three times daily for next three days and then use as needed).  . Multiple Vitamin (MULTIVITAMIN) tablet Take 1 tablet by mouth daily.  Marland Kitchen nystatin cream (MYCOSTATIN) Apply 1 application topically 2 (two) times daily.  Marland Kitchen triamcinolone cream (KENALOG) 0.5 % Apply 1 application topically 2 (two) times daily.  . meloxicam (MOBIC) 15 MG tablet Take one daily with food for 2 weeks, can take with tylenol, can not take with aleve, iburpofen, then as needed daily for pain (Patient not taking: Reported on 08/04/2017)   No current facility-administered medications on file prior to visit.     ROS: all negative except above.   Physical Exam:  BP 110/72   Pulse 77   Temp (!) 97.5 F (36.4 C)   Wt 236  lb (107 kg)   LMP 07/16/2017 Comment: irregular periods  SpO2 97%   BMI 41.81 kg/m   General Appearance: Well nourished, obese, in no apparent distress. Neck: Supple.  Respiratory: Respiratory effort normal, BS equal bilaterally without rales, rhonchi, wheezing or stridor.  Cardio: RRR with no MRGs. Brisk peripheral pulses without edema.  Abdomen: Soft, + BS.  Non tender, no guarding, rebound, hernias, masses. Lymphatics: Non tender without lymphadenopathy.   Musculoskeletal: normal gait.  Skin: Warm, dry; rash to bilateral groin, perineal area and extending up gluteal cleft - thickened/darkened skin, somewhat lichenified with erythematous/tender border. No distinct lesions or discharge.  Psych: Awake and oriented X 3, normal affect, Insight and Judgment appropriate.     Izora Ribas, NP 3:52 PM [facility]

## 2017-08-04 ENCOUNTER — Encounter: Payer: Self-pay | Admitting: Adult Health

## 2017-08-04 ENCOUNTER — Ambulatory Visit: Payer: BC Managed Care – PPO | Admitting: Adult Health

## 2017-08-04 VITALS — BP 110/72 | HR 77 | Temp 97.5°F | Wt 236.0 lb

## 2017-08-04 DIAGNOSIS — R21 Rash and other nonspecific skin eruption: Secondary | ICD-10-CM | POA: Diagnosis not present

## 2017-08-04 DIAGNOSIS — B3789 Other sites of candidiasis: Secondary | ICD-10-CM

## 2017-08-04 MED ORDER — FLUCONAZOLE 150 MG PO TABS: ORAL_TABLET | ORAL | 0 refills | Status: DC

## 2017-08-04 NOTE — Patient Instructions (Signed)
Skin Yeast Infection Skin yeast infection is a condition in which there is an overgrowth of yeast (candida) that normally lives on the skin. This condition usually occurs in areas of the skin that are constantly warm and moist, such as the armpits or the groin. What are the causes? This condition is caused by a change in the normal balance of the yeast and bacteria that live on the skin. What increases the risk? This condition is more likely to develop in:  People who are obese.  Pregnant women.  Women who take birth control pills.  People who have diabetes.  People who take antibiotic medicines.  People who take steroid medicines.  People who are malnourished.  People who have a weak defense (immune) system.  People who are 86 years of age or older.  What are the signs or symptoms? Symptoms of this condition include:  A red, swollen area of the skin.  Bumps on the skin.  Itchiness.  How is this diagnosed? This condition is diagnosed with a medical history and physical exam. Your health care provider may check for yeast by taking light scrapings of the skin to be viewed under a microscope. How is this treated? This condition is treated with medicine. Medicines may be prescribed or be available over-the-counter. The medicines may be:  Taken by mouth (orally).  Applied as a cream.  Follow these instructions at home:  Take or apply over-the-counter and prescription medicines only as told by your health care provider.  Eat more yogurt. This may help to keep your yeast infection from returning.  Maintain a healthy weight. If you need help losing weight, talk with your health care provider.  Keep your skin clean and dry.  If you have diabetes, keep your blood sugar under control. Contact a health care provider if:  Your symptoms go away and then return.  Your symptoms do not get better with treatment.  Your symptoms get worse.  Your rash spreads.  You  have a fever or chills.  You have new symptoms.  You have new warmth or redness of your skin. This information is not intended to replace advice given to you by your health care provider. Make sure you discuss any questions you have with your health care provider. Document Released: 01/06/2011 Document Revised: 12/15/2015 Document Reviewed: 10/22/2014 Elsevier Interactive Patient Education  2018 [employer].     Probiotics What are probiotics? Probiotics are the good bacteria and yeasts that live in your body and keep you and your digestive system healthy. Probiotics also help your body's defense (immune) system and protect your body against bad bacterial growth. Certain foods contain probiotics, such as yogurt. Probiotics can also be purchased as a supplement. As with any supplement or drug, it is important to discuss its use with your health care provider. What affects the balance of bacteria in my body? The balance of bacteria in your body can be affected by:  Antibiotic medicines. Antibiotics are sometimes necessary to treat infection. Unfortunately, they may kill good or friendly bacteria in your body as well as the bad bacteria. This may lead to stomach problems like diarrhea, gas, and cramping.  Disease. Some conditions are the result of an overgrowth of bad bacteria, yeasts, parasites, or fungi. These conditions include: ? Infectious diarrhea. ? Stomach and respiratory infections. ? Skin infections. ? Irritable bowel syndrome (IBS). ? Inflammatory bowel diseases. ? Ulcer due to Helicobacter pylori (H. pylori) infection. ? Tooth decay and periodontal disease. ?  Vaginal infections.  Stress and poor diet may also lower the good bacteria in your body. What type of probiotic is right for me? Probiotics are available over the counter at your local pharmacy, health food, or grocery store. They come in many different forms, combinations of strains, and dosing strengths. Some may  need to be refrigerated. Always read the label for storage and usage instructions. Specific strains have been shown to be more effective for certain conditions. Ask your health care provider what option is best for you. Why would I need probiotics? There are many reasons your health care provider might recommend a probiotic supplement, including:  Diarrhea.  Constipation.  IBS.  Respiratory infections.  Yeast infections.  Acne, eczema, and other skin conditions.  Frequent urinary tract infections (UTIs).  Are there side effects of probiotics? Some people experience mild side effects when taking probiotics. Side effects are usually temporary and may include:  Gas.  Bloating.  Cramping.  Rarely, serious side effects, such as infection or immune system changes, may occur. What else do I need to know about probiotics?  There are many different strains of probiotics. Certain strains may be more effective depending on your condition. Probiotics are available in varying doses. Ask your health care provider which probiotic you should use and how often.  If you are taking probiotics along with antibiotics, it is generally recommended to wait at least 2 hours between taking the antibiotic and taking the probiotic. For more information: [facility] for Com[facility]ary and Alternative [facility] [email or web address] This information is not intended to replace advice given to you by your health care provider. Make sure you discuss any questions you have with your health care provider. Document Released: 11/15/2013 Document Revised: 03/17/2016 Document Reviewed: 07/18/2013 Elsevier Interactive Patient Education  2017 [employer].

## 2017-08-12 ENCOUNTER — Encounter (INDEPENDENT_AMBULATORY_CARE_PROVIDER_SITE_OTHER): Payer: Self-pay

## 2017-10-18 DIAGNOSIS — Z6841 Body Mass Index (BMI) 40.0 and over, adult: Secondary | ICD-10-CM

## 2017-10-18 DIAGNOSIS — E785 Hyperlipidemia, unspecified: Secondary | ICD-10-CM | POA: Insufficient documentation

## 2017-10-18 NOTE — Progress Notes (Signed)
Complete Physical  Assessment and Plan:  Diagnoses and all orders for this visit:  Encounter for routine adult health examination without abnormal findings She will follow up with GYN for pelvic/breasts  Intrinsic asthma Well controlled, avoid triggers, continue inhaler PRN  Prolactin secreting Pituitary Adenoma (1997) - prolactin  - has been referred to endocrinology, but never saw, will check on referral status, assist with coordination  Multiple food allergies Avoid triggers, doing well on OTC agents  Intraductal papilloma S/p excisional biopsy, negative Continue annual mammograms Established with Dr. Donne Hazel if needed   HX of seasonal allergies Continue OTC allergy pills  Mixed hyperlipidemia Mild elevations currently treated by lifestyle modification only Continue low cholesterol diet and exercise.  Check lipid panel.   Morbid obesity Long discussion about weight loss, diet, and exercise Recommended diet heavy in fruits and veggies and low in animal meats, cheeses, and dairy products, appropriate calorie intake Discussed appropriate weight for height  Follow up at next visit  Need for tetanus booster Td administered today   Discussed med's effects and SE's. Screening labs and tests as requested with regular follow-up as recommended. Over 40 minutes of exam, counseling, chart review, and complex, high level critical decision making was performed this visit.   Future Appointments  Date Time Provider [place]  01/04/2018  3:30 PM Elayne Snare, MD [id number] None  04/21/2018  3:30 PM Liane Comber, NP [facility] None  10/20/2018  2:00 PM Liane Comber, NP [facility] None     HPI  47 y.o. AA female, single, works as Microbiologist presents for a complete physical and follow up for has Intrinsic asthma; Multiple food allergies; HX of seasonal allergies; Prolactin secreting Pituitary Adenoma (1997); Intraductal papilloma; Hyperlipidemia;  and Morbid obesity with BMI of 40.0-44.9, adult ([place]) on their problem list.  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 Cruzita Lederer for ongoing management but has not yet been seen, struggling with coordinating an appointment. She also has recurrent intraductal papilloma, most recently in 06/2017 noted 1.3cm intraductal mass at R breast 2'o clock, 1 cm from nipple for which she underwent excisional biopsy and was found to be benign. Dr. Donne Hazel was consulted but has deferred surgical intervention at this time.   BMI is Body mass index is 42.87 kg/m., she has been working on diet and exercise. She has cut out soda, red meat, reducing sugar/carbs.  Wt Readings from Last 3 Encounters:  10/19/17 242 lb (109.8 kg)  08/04/17 236 lb (107 kg)  07/26/17 234 lb (106.1 kg)   Her blood pressure has been controlled at home, today their BP is BP: 106/70 She does workout. She denies chest pain, shortness of breath, dizziness.   She is not on cholesterol medication and denies myalgias. Her cholesterol is not at goal. The cholesterol last visit was:   Lab Results  Component Value Date   CHOL 190 04/14/2017   HDL 58 04/14/2017   LDLCALC 111 (H) 04/14/2017   TRIG 105 04/14/2017   CHOLHDL 3.3 04/14/2017   Last A1C in the office was:  Lab Results  Component Value Date   HGBA1C 5.4 04/14/2017   Last GFR: Lab Results  Component Value Date   GFRAA 82 04/14/2017   Patient is on Vitamin D supplement.   No results found for: VD25OH    Current Medications:  Current Outpatient Medications on File Prior to Visit  Medication Sig Dispense Refill  . albuterol (PROVENTIL HFA;VENTOLIN HFA) 108 (90 Base) MCG/ACT inhaler 1 to 2 puffs (5 minutes apart) every 4 hour as needed for Asthma 18 g 99  . calcium carbonate (TUMS EX) 750 MG chewable tablet Chew 2 tablets by mouth daily.    . cetirizine (ZYRTEC) 10 MG tablet  Take 1 tablet (10 mg total) by mouth daily. 90 tablet 1  . cholecalciferol (VITAMIN D) 1000 UNITS tablet Take 1,000 Units by mouth daily.    . fluconazole (DIFLUCAN) 150 MG tablet Take 1 tab by mouth once weekly for 3 weeks for yeast rash. 3 tablet 0  . ibuprofen (ADVIL,MOTRIN) 600 MG tablet Take 1 tablet (600 mg total) by mouth every 8 (eight) hours as needed for moderate pain (would use three times daily for next three days and then use as needed). 30 tablet 2  . meloxicam (MOBIC) 15 MG tablet Take one daily with food for 2 weeks, can take with tylenol, can not take with aleve, iburpofen, then as needed daily for pain 30 tablet 1  . Multiple Vitamin (MULTIVITAMIN) tablet Take 1 tablet by mouth daily.    Marland Kitchen nystatin cream (MYCOSTATIN) Apply 1 application topically 2 (two) times daily. 60 g 1  . triamcinolone cream (KENALOG) 0.5 % Apply 1 application topically 2 (two) times daily. 80 g 2   No current facility-administered medications on file prior to visit.    Allergies:  Allergies  Allergen Reactions  . Banana Hives and Shortness Of Breath  . Peanut-Containing Drug Products Hives and Shortness Of Breath  . Shellfish Allergy Hives and Shortness Of Breath   Medical History:  She has Intrinsic asthma; Multiple food allergies; HX of seasonal allergies; Prolactin secreting Pituitary Adenoma (1997); Intraductal papilloma; Hyperlipidemia; and Morbid obesity with BMI of 40.0-44.9, adult ([place]) on their problem list. Health Maintenance:   Immunization History  Administered Date(s) Administered  . Tdap 10/19/2017    Tetanus: Remote Pneumovax:-  Prevnar 13: - Flu vaccine: n/a  Zostavax: n/a   LMP: No LMP recorded. (Menstrual status: Irregular Periods). Pap: Remote, never sexually active, will follow up with GYN MGM: 06/2017 DEXA: n/a  Colonoscopy: never  EGD: -  Last Dental Exam: last 2 years ago, will schedule  Last Eye Exam: 2018, wears glasses, will follow up  Patient Care  Team: Unk Pinto, MD as PCP - General (Internal Medicine)  Surgical History:  She has a past surgical history that includes Tonsillectomy; Radioactive seed guided excisional breast biopsy (Left, 07/26/2017); Breast ductal system excision (Left, 07/26/2017); and right breast biopsy (Right, 2019). Family History:  Herfamily history includes Breast cancer in her paternal grandmother; Diabetes in her maternal grandfather and paternal grandmother; Drug abuse in her mother; Hypertension in her maternal grandfather and mother; Lung cancer in her maternal grandmother; Spina bifida in her mother. Social History:  She reports that she has never smoked. She has never used smokeless tobacco. She reports that she does not drink alcohol or use drugs.  Review of Systems: Review of Systems  Constitutional: Negative for malaise/fatigue and weight loss.  HENT: Negative for hearing loss and tinnitus.   Eyes: Negative for blurred vision and double vision.  Respiratory: Negative for cough, sputum production, shortness of breath and wheezing.   Cardiovascular: Negative for chest pain, palpitations, orthopnea, claudication, leg swelling and PND.  Gastrointestinal: Negative for abdominal pain, blood in stool, constipation, diarrhea, heartburn, melena, nausea and vomiting.  Genitourinary: Negative.   Musculoskeletal: Negative for falls, joint pain and  myalgias.  Skin: Negative for rash.  Neurological: Negative for dizziness, tingling, sensory change, weakness and headaches.  Endo/Heme/Allergies: Negative for polydipsia.  Psychiatric/Behavioral: Negative.  Negative for depression, memory loss, substance abuse and suicidal ideas. The patient is not nervous/anxious and does not have insomnia.   All other systems reviewed and are negative.   Physical Exam: Estimated body mass index is 42.87 kg/m as calculated from the following:   Height as of this encounter: 5\' 3"  (1.6 m).   Weight as of this encounter: 242  lb (109.8 kg). BP 106/70   Pulse 79   Temp 97.7 F (36.5 C)   Ht 5\' 3"  (1.6 m)   Wt 242 lb (109.8 kg)   SpO2 97%   BMI 42.87 kg/m  General Appearance: Well nourished, in no apparent distress.  Eyes: PERRLA, EOMs, conjunctiva no swelling or erythema, normal fundi and vessels.  Sinuses: No Frontal/maxillary tenderness  ENT/Mouth: Ext aud canals clear, normal light reflex with TMs without erythema, bulging. Good dentition. No erythema, swelling, or exudate on post pharynx. Tonsils not swollen or erythematous. Hearing normal.  Neck: Supple, thyroid normal. No bruits  Respiratory: Respiratory effort normal, BS equal bilaterally without rales, rhonchi, wheezing or stridor.  Cardio: RRR without murmurs, rubs or gallops. Brisk peripheral pulses without edema.  Chest: symmetric, with normal excursions and percussion.  Breasts: Defer to GYN, will schedule soon  Abdomen: Soft, nontender, no guarding, rebound, hernias, masses, or organomegaly.  Lymphatics: Non tender without lymphadenopathy.  Genitourinary: Defer to GYN, will schedule soon Musculoskeletal: Full ROM all peripheral extremities,5/5 strength, and normal gait.  Skin: Warm, dry without rashes, lesions, ecchymosis. Neuro: Cranial nerves intact, reflexes equal bilaterally. Normal muscle tone, no cerebellar symptoms. Sensation intact.  Psych: Awake and oriented X 3, normal affect, Insight and Judgment appropriate.   EKG: WNL no ST changes.  Izora Ribas 5:28 PM [facility]

## 2017-10-19 ENCOUNTER — Encounter: Payer: Self-pay | Admitting: Adult Health

## 2017-10-19 ENCOUNTER — Ambulatory Visit: Payer: BC Managed Care – PPO | Admitting: Adult Health

## 2017-10-19 VITALS — BP 106/70 | HR 79 | Temp 97.7°F | Ht 63.0 in | Wt 242.0 lb

## 2017-10-19 DIAGNOSIS — Z1389 Encounter for screening for other disorder: Secondary | ICD-10-CM

## 2017-10-19 DIAGNOSIS — Z0000 Encounter for general adult medical examination without abnormal findings: Secondary | ICD-10-CM | POA: Diagnosis not present

## 2017-10-19 DIAGNOSIS — Z1329 Encounter for screening for other suspected endocrine disorder: Secondary | ICD-10-CM | POA: Diagnosis not present

## 2017-10-19 DIAGNOSIS — Z130 Encounter for screening for diseases of the blood and blood-forming organs and certain disorders involving the immune mechanism: Secondary | ICD-10-CM

## 2017-10-19 DIAGNOSIS — Z136 Encounter for screening for cardiovascular disorders: Secondary | ICD-10-CM

## 2017-10-19 DIAGNOSIS — Z91018 Allergy to other foods: Secondary | ICD-10-CM

## 2017-10-19 DIAGNOSIS — Z889 Allergy status to unspecified drugs, medicaments and biological substances status: Secondary | ICD-10-CM

## 2017-10-19 DIAGNOSIS — Z131 Encounter for screening for diabetes mellitus: Secondary | ICD-10-CM | POA: Diagnosis not present

## 2017-10-19 DIAGNOSIS — Z13220 Encounter for screening for lipoid disorders: Secondary | ICD-10-CM

## 2017-10-19 DIAGNOSIS — E782 Mixed hyperlipidemia: Secondary | ICD-10-CM

## 2017-10-19 DIAGNOSIS — I10 Essential (primary) hypertension: Secondary | ICD-10-CM

## 2017-10-19 DIAGNOSIS — E559 Vitamin D deficiency, unspecified: Secondary | ICD-10-CM

## 2017-10-19 DIAGNOSIS — D369 Benign neoplasm, unspecified site: Secondary | ICD-10-CM

## 2017-10-19 DIAGNOSIS — Z6841 Body Mass Index (BMI) 40.0 and over, adult: Secondary | ICD-10-CM

## 2017-10-19 DIAGNOSIS — Z79899 Other long term (current) drug therapy: Secondary | ICD-10-CM

## 2017-10-19 DIAGNOSIS — D352 Benign neoplasm of pituitary gland: Secondary | ICD-10-CM

## 2017-10-19 DIAGNOSIS — Z23 Encounter for immunization: Secondary | ICD-10-CM

## 2017-10-19 DIAGNOSIS — J45909 Unspecified asthma, uncomplicated: Secondary | ICD-10-CM

## 2017-10-19 NOTE — Patient Instructions (Addendum)
Aim for 7+ servings of fruits and vegetables daily  80+ fluid ounces of water or unsweet tea for healthy kidneys  1 drink of alcohol per day max for women, less is better  Avoid smoking/tobacco products  Limit animal fats in diet for cholesterol and heart health - choose grass fed whenever available  Aim for low stress - take time to unwind and care for your mental health  Aim for 150 min of moderate intensity exercise weekly for heart health, and weights twice weekly for bone health  Aim for 7-9 hours of sleep daily   Easy weight loss tips    Drink 1/2 your body weight in fluid ounces of water daily; drink a tall glass of water 30 min before meals  Don't eat until you're stuffed- listen to your stomach and eat until you are 80% full   Try eating off of a salad plate; wait 10 min after finishing before going back for seconds  Start by eating the vegetables on your plate; aim for 50% of your meals to be fruits or vegetables  Then eat your protein - lean meats (grass fed if possible), fish, beans, nuts in moderation  Eat your carbs/starch last ONLY if you still are hungry. If you can, stop before finishing it all  Avoid sugar and flour - the closer it looks to it's original form in nature, typically the better it is for you  Splurge in moderation - "assign" days when you get to splurge and have the "bad stuff" - I like to follow a 80% - 20% plan- "good" choices 80 % of the time, "bad" choices in moderation 20% of the time  Simple equation is: Calories out > calories in = weight loss - even if you eat the bad stuff, if you limit portions, you will still lose weight      When it comes to diets, agreement about the perfect plan isn't easy to find, even among the experts. Experts at the [employer] developed an idea known as the Healthy Eating Plate. Just imagine a plate divided into logical, healthy portions.  The emphasis is on diet quality:  Load up  on vegetables and fruits - one-half of your plate: Aim for color and variety, and remember that potatoes don't count.  Go for whole grains - one-quarter of your plate: Whole wheat, barley, wheat berries, quinoa, oats, brown rice, and foods made with them. If you want pasta, go with whole wheat pasta.  Protein power - one-quarter of your plate: Fish, chicken, beans, and nuts are all healthy, versatile protein sources. Limit red meat.  The diet, however, does go beyond the plate, offering a few other suggestions.  Use healthy plant oils, such as olive, canola, soy, corn, sunflower and peanut. Check the labels, and avoid partially hydrogenated oil, which have unhealthy trans fats.  If you're thirsty, drink water. Coffee and tea are good in moderation, but skip sugary drinks and limit milk and dairy products to one or two daily servings.  The type of carbohydrate in the diet is more important than the amount. Some sources of carbohydrates, such as vegetables, fruits, whole grains, and beans-are healthier than others.  Finally, stay active.    What You Need to Know About Electronic Cigarettes Electronic cigarettes, or e-cigarettes, are battery-operated devices that deliver nicotine to your body. They come in many shapes, including in the shape of a cigarette, pipe, pen, and even a USB memory stick. E-cigarettes have a  cartridge that contains a liquid form of nicotine. When you use the device, the liquid heats up. It then becomes a vapor. Inhaling this vapor is called vaping. While e-cigarettes do not contain tar and the same cancer-causing chemicals that are in tobacco cigarettes, they may contain other harmful and cancer-causing chemicals, such as formaldehyde or acetaldehyde. Nicotine is thought to increase your risk for certain types of cancer. Many e-cigarettes have chemical colorings and flavorings. It is not clear how much nicotine you get when vaping. The health effects of vaping are not  completely known. Some people may use e-cigarettes in order to quit smoking tobacco. However, this has not been proven to work, and the Transport planner (FDA) has not approved e-cigarettes for this purpose. How can using electronic cigarettes affect me?  E-cigarettes contain nicotine, which is a very addictive drug. Vaping may make you crave nicotine. Nicotine: ? Changes your blood sugar levels. ? Increases your heart rate, blood pressure, and breathing rate. ? Increases your risk of developing blood clots (hypercoaguable state) and diabetes.  If you smoke e-cigarettes, you may be more likely to start smoking or to smoke more tobacco cigarettes.  Becoming addicted to nicotine may make your brain more sensitive to other addictive drugs. You may move to other addictive substances.  If you are pregnant, the nicotine in e-cigarettes may be harmful to your baby. Nicotine can cause: ? Brain or lung problems for your baby. ? Your baby to be born too early. ? Your baby to be born with a low birth weight.  If you are a child or a teen, vaping may affect your memory or lower your attention span.  You may be in danger of overdosing on nicotine. Nicotine poisoning can cause nausea, vomiting, seizures, and trouble breathing. What are the benefits of stopping vaping? If you stop vaping, you can avoid:  Getting addicted to nicotine.  Having nicotine side effects.  Getting nicotine poisoning.  Being exposed to dangerous chemicals.  Increasing your risk of health problems.  Increasing your baby's risk of health problems, if you are pregnant.  Being more likely to use other addictive substances.  What steps can I take to stop vaping? If you can stop vaping on your own, do it before you become addicted to nicotine. If you need help stopping, ask your health care provider. There are three effective ways to fight nicotine addiction:  Nicotine replacement therapy. Using nicotine gum or  a nicotine patch blocks your craving for nicotine. Over time, you can reduce the amount of nicotine you use until you can stop using nicotine completely without having cravings.  Prescription medicines approved to fight nicotine addiction. These stop nicotine cravings or block the effects of nicotine.  Behavioral therapy. This may include: ? A self-help smoking cessation program. ? Individual or group therapy. ? A smoking cessation support group.  Where can I get support? You can get support at these sites:  [employer]: MusicTeasers.nl  U.S. Department of Health and Human Services: https://smokefree.gov  American Lung Association: WealthToys.de  Where can I get more information? Learn more about e-cigarettes from:  [place]: PicCapture.uy  U.S. Department of Health and Human Services: StoreMirror.com.cy  Summary  E-cigarettes can cause nicotine addiction.  E-cigarettes are not approved as a way to stop smoking.  E-cigarettes are not a risk-free alternative to smoking tobacco.  There are ways to fight nicotine addiction.  Talk to your health care provider if you  are unable to stop vaping on your own. This information is not intended to replace advice given to you by your health care provider. Make sure you discuss any questions you have with your health care provider. Document Released: 08/12/2015 Document Revised: 01/08/2016 Document Reviewed: 04/12/2015 Elsevier Interactive Patient Education  [employer].

## 2017-10-20 LAB — URINALYSIS W MICROSCOPIC + REFLEX CULTURE
Bacteria, UA: NONE SEEN /HPF
Bilirubin Urine: NEGATIVE
GLUCOSE, UA: NEGATIVE
HGB URINE DIPSTICK: NEGATIVE
HYALINE CAST: NONE SEEN /LPF
Ketones, ur: NEGATIVE
Leukocyte Esterase: NEGATIVE
Nitrites, Initial: NEGATIVE
PROTEIN: NEGATIVE
Specific Gravity, Urine: 1.025 (ref 1.001–1.03)
pH: 7 (ref 5.0–8.0)

## 2017-10-20 LAB — COMPLETE METABOLIC PANEL WITH GFR
AG RATIO: 1.3 (calc) (ref 1.0–2.5)
ALT: 16 U/L (ref 6–29)
AST: 21 U/L (ref 10–35)
Albumin: 3.8 g/dL (ref 3.6–5.1)
Alkaline phosphatase (APISO): 91 U/L (ref 33–115)
BILIRUBIN TOTAL: 0.3 mg/dL (ref 0.2–1.2)
BUN: 14 mg/dL (ref 7–25)
CALCIUM: 8.9 mg/dL (ref 8.6–10.2)
CHLORIDE: 105 mmol/L (ref 98–110)
CO2: 27 mmol/L (ref 20–32)
Creat: 0.83 mg/dL (ref 0.50–1.10)
GFR, EST NON AFRICAN AMERICAN: 85 mL/min/{1.73_m2} (ref >=60)
GFR, Est African American: 98 mL/min/{1.73_m2} (ref >=60)
Globulin: 3 g/dL (calc) (ref 1.9–3.7)
Glucose, Bld: 84 mg/dL (ref 65–99)
POTASSIUM: 4 mmol/L (ref 3.5–5.3)
Sodium: 138 mmol/L (ref 135–146)
TOTAL PROTEIN: 6.8 g/dL (ref 6.1–8.1)

## 2017-10-20 LAB — LIPID PANEL
Cholesterol: 179 mg/dL (ref <200)
HDL: 56 mg/dL (ref >50)
LDL CHOLESTEROL (CALC): 104 mg/dL — AB
NON-HDL CHOLESTEROL (CALC): 123 mg/dL (ref <130)
TRIGLYCERIDES: 97 mg/dL (ref <150)
Total CHOL/HDL Ratio: 3.2 (calc) (ref <5.0)

## 2017-10-20 LAB — CBC WITH DIFFERENTIAL/PLATELET
BASOS PCT: 0.4 %
Basophils Absolute: 32 cells/uL (ref 0–200)
EOS ABS: 235 {cells}/uL (ref 15–500)
Eosinophils Relative: 2.9 %
HCT: 37.5 % (ref 35.0–45.0)
HEMOGLOBIN: 12.3 g/dL (ref 11.7–15.5)
Lymphs Abs: 3345 cells/uL (ref 850–3900)
MCH: 27.1 pg (ref 27.0–33.0)
MCHC: 32.8 g/dL (ref 32.0–36.0)
MCV: 82.6 fL (ref 80.0–100.0)
MPV: 11.4 fL (ref 7.5–12.5)
Monocytes Relative: 8.1 %
Neutro Abs: 3831 cells/uL (ref 1500–7800)
Neutrophils Relative %: 47.3 %
PLATELETS: 214 10*3/uL (ref 140–400)
RBC: 4.54 10*6/uL (ref 3.80–5.10)
RDW: 13.1 % (ref 11.0–15.0)
Total Lymphocyte: 41.3 %
WBC: 8.1 10*3/uL (ref 3.8–10.8)
WBCMIX: 656 {cells}/uL (ref 200–950)

## 2017-10-20 LAB — MICROALBUMIN / CREATININE URINE RATIO
CREATININE, URINE: 170 mg/dL (ref 20–275)
MICROALB/CREAT RATIO: 2 ug/mg{creat} (ref <30)
Microalb, Ur: 0.4 mg/dL

## 2017-10-20 LAB — VITAMIN B12: VITAMIN B 12: 835 pg/mL (ref 200–1100)

## 2017-10-20 LAB — PROLACTIN: PROLACTIN: 68.4 ng/mL — AB

## 2017-10-20 LAB — TSH: TSH: 0.88 m[IU]/L

## 2017-10-20 LAB — HEMOGLOBIN A1C
HEMOGLOBIN A1C: 5.4 %{Hb} (ref <5.7)
MEAN PLASMA GLUCOSE: 108 (calc)
eAG (mmol/L): 6 (calc)

## 2017-10-20 LAB — NO CULTURE INDICATED

## 2017-10-20 LAB — VITAMIN D 25 HYDROXY (VIT D DEFICIENCY, FRACTURES): Vit D, 25-Hydroxy: 27 ng/mL — ABNORMAL LOW (ref 30–100)

## 2017-11-02 ENCOUNTER — Other Ambulatory Visit: Payer: Self-pay

## 2017-11-02 MED ORDER — ALBUTEROL SULFATE HFA 108 (90 BASE) MCG/ACT IN AERS
INHALATION_SPRAY | RESPIRATORY_TRACT | 1 refills | Status: DC
Start: 2017-11-02 — End: 2018-09-05

## 2018-01-03 NOTE — Progress Notes (Signed)
Patient ID: Wanda Douglas, female   DOB: 1971-02-08, 47 y.o.   MRN: [id number]             Referring PCP: Liane Comber, NP  Chief complaint: High prolactin  History of Present Illness  In 1997 she was evaluated for missed menstrual cycles along with breast milk discharge She was told to have a high prolactin and a small pituitary adenoma She was apparently treated with bromocriptine for several years when [place] was in another [place] and this apparently was told her menstrual cycles She apparently stopped taking her bromocriptine, probably in 2017 because she was having dizziness and nausea from this     She was again advised to try this in 12/18 but could not tolerate it Subsequently has not taken any treatment  Currently she does continue to have irregular menstrual cycles coming every 2 to 6 months and also milky breast discharge Her last menstrual cycle was in 12/2017 She does complain of recently new generalized headaches for the last month or so which are relatively mild and dull She does not complain of any visual changes   Prolactin levels appear to be persistently high:  Lab Results  Component Value Date   PROLACTIN 68.4 (H) 10/19/2017   PROLACTIN 52.5 (H) 04/14/2017   PROLACTIN 103.4 05/07/2015     MRI of pituitary gland done in 06/2015 shows 46mm benign adenoma without any other changes   She is now referred here for further management  Allergies as of 01/04/2018      Reactions   Banana Hives, Shortness Of Breath   Peanut-containing Drug Products Hives, Shortness Of Breath   Shellfish Allergy Hives, Shortness Of Breath      Medication List        Accurate as of 01/04/18  4:01 PM. Always use your most recent med list.          albuterol 108 (90 Base) MCG/ACT inhaler Commonly known as:  PROVENTIL HFA;VENTOLIN HFA 1 to 2 puffs (5 minutes apart) every 4 hour as needed for Asthma   calcium carbonate 750 MG chewable tablet Commonly known as:  TUMS EX Chew 2  tablets by mouth daily.   cetirizine 10 MG tablet Commonly known as:  ZYRTEC Take 1 tablet (10 mg total) by mouth daily.   cholecalciferol 1000 units tablet Commonly known as:  VITAMIN D Take 1,000 Units by mouth daily.   ibuprofen 600 MG tablet Commonly known as:  ADVIL,MOTRIN Take 1 tablet (600 mg total) by mouth every 8 (eight) hours as needed for moderate pain (would use three times daily for next three days and then use as needed).   meloxicam 15 MG tablet Commonly known as:  MOBIC Take one daily with food for 2 weeks, can take with tylenol, can not take with aleve, iburpofen, then as needed daily for pain   multivitamin tablet Take 1 tablet by mouth daily.       Allergies:  Allergies  Allergen Reactions  . Banana Hives and Shortness Of Breath  . Peanut-Containing Drug Products Hives and Shortness Of Breath  . Shellfish Allergy Hives and Shortness Of Breath    Past Medical History:  Diagnosis Date  . Asthma   . Bloody discharge from left nipple 07/2017  . Irregular menstrual cycle   . Left breast mass 07/2017  . Pituitary adenoma [facility])     Past Surgical History:  Procedure Laterality Date  . BREAST DUCTAL SYSTEM EXCISION Left 07/26/2017   Procedure: [place]  BREAST;  Surgeon: Rolm Bookbinder, MD;  Location: [place];  Service: General;  Laterality: Left;  . RADIOACTIVE SEED GUIDED EXCISIONAL BREAST BIOPSY Left 07/26/2017   Procedure: LEFT RADIOACTIVE SEED GUIDED EXCISIONAL BREAST BIOPSY ERAS PATHWAY;  Surgeon: Rolm Bookbinder, MD;  Location: [place];  Service: General;  Laterality: Left;  . right breast biopsy Right 2019   With axillary lymph node biopsy  . TONSILLECTOMY      Family History  Problem Relation Age of Onset  . Breast cancer Paternal Grandmother   . Diabetes Paternal Grandmother   . Hypertension Mother   . Spina bifida Mother   . Drug abuse Mother   . Lung cancer Maternal Grandmother         smoker  . Hypertension Maternal Grandfather   . Diabetes Maternal Grandfather     Social History:  reports that she has never smoked. She has never used smokeless tobacco. She reports that she does not drink alcohol or use drugs.   Review of Systems  Constitutional: Negative for weight loss.  HENT: Positive for headaches.        Dull and mild persistent headache, all over cranium especially front  Eyes: Negative for blurred vision.  Respiratory: Negative for shortness of breath.   Cardiovascular: Positive for leg swelling.  Gastrointestinal: Negative for constipation.  Endocrine: Positive for fatigue and cold intolerance.  Genitourinary: Negative for nocturia.       Occasionally has nocturia about once or twice  Musculoskeletal: Positive for joint pain, muscle cramps and back pain.  Skin: Negative for dry skin.  Neurological: Negative for weakness.   Lab Results  Component Value Date   TSH 0.88 10/19/2017   TSH 0.59 04/14/2017   TSH 0.518 05/07/2015    She is concerned about her difficulty with losing weight She has been gaining weight over the last 5 years but has been about the same over the last year She thinks she is changing her diet significantly but not able to lose weight Also trying to walk  Wt Readings from Last 3 Encounters:  01/04/18 237 lb 3.2 oz (107.6 kg)  10/19/17 242 lb (109.8 kg)  08/04/17 236 lb (107 kg)    EXAM:  BP 98/60   Pulse 75   Wt 237 lb 3.2 oz (107.6 kg)   BMI 42.02 kg/m   Physical Exam  Constitutional:  She has generalized obesity, no cushingoid features  HENT:  Mouth/Throat: Oropharynx is clear and moist.  Eyes: Conjunctivae are normal.  Visual fields normal by confrontation Fundi show normal optic disks, better seen on the left  Neck: No thyromegaly present.  Cardiovascular: Regular rhythm and normal heart sounds. Exam reveals no gallop.  No murmur heard. Pulmonary/Chest: Breath sounds normal. She has no wheezes. She has no  rales.  Abdominal: Soft. She exhibits no distension and no mass. There is no tenderness.  Musculoskeletal: She exhibits no edema.  Lymphadenopathy:    She has no cervical adenopathy.  Neurological: She displays normal reflexes.  Skin: Skin is warm and dry. No pallor.  Psychiatric: She has a normal mood and affect.     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 Any transcriptional errors that result from this process are unintentional.

## 2018-01-04 ENCOUNTER — Ambulatory Visit: Payer: BC Managed Care – PPO | Admitting: Endocrinology

## 2018-01-04 ENCOUNTER — Encounter: Payer: Self-pay | Admitting: Endocrinology

## 2018-01-04 VITALS — BP 98/60 | HR 75 | Wt 237.2 lb

## 2018-01-04 DIAGNOSIS — D352 Benign neoplasm of pituitary gland: Secondary | ICD-10-CM

## 2018-01-04 DIAGNOSIS — R635 Abnormal weight gain: Secondary | ICD-10-CM

## 2018-01-04 DIAGNOSIS — R5383 Other fatigue: Secondary | ICD-10-CM | POA: Diagnosis not present

## 2018-01-04 LAB — T4, FREE: FREE T4: 0.88 ng/dL (ref 0.60–1.60)

## 2018-01-04 MED ORDER — CABERGOLINE 0.5 MG PO TABS: 0.2500 mg | ORAL_TABLET | ORAL | 0 refills | Status: DC

## 2018-01-05 LAB — PROLACTIN: PROLACTIN: 85.5 ng/mL — AB (ref 4.8–23.3)

## 2018-01-05 LAB — INSULIN-LIKE GROWTH FACTOR: INSULIN LIKE GF 1: 140 ng/mL (ref 57–195)

## 2018-01-07 NOTE — Progress Notes (Signed)
Assessment and Plan:  Wanda Douglas was seen today for joint pain.  Diagnoses and all orders for this visit:  Polyarthralgia/ Fatigue, unspecified type Will order labs to r/o autoimmune/infectious etiology due to hx of stiffness, family history and recent fatigue/lethargy Advised to take meloxicam daily which has been prescribed previously, make take tylenol if needed, if labs negative and pain continuing will refer to ortho for workup/management, imaging deferred to them -     Sedimentation rate -     ANA -     Anti-DNA antibody, double-stranded -     Rheumatoid factor -     Cyclic citrul peptide antibody, IgG -     B. burgdorfi antibodies -     Anti-Smith antibody  Other orders -     predniSONE (DELTASONE) 20 MG tablet; 2 tablets daily for 3 days, 1 tablet daily for 4 days.  Further disposition pending results of labs. Discussed med's effects and SE's.   Over 15 minutes of exam, counseling, chart review, and critical decision making was performed.   Future Appointments  Date Time Provider [place]  02/07/2018  3:30 PM [facility] None  02/09/2018  3:45 PM Wanda Snare, MD [facility] None  04/21/2018  3:30 PM Wanda Comber, NP [facility] None  10/20/2018  2:00 PM Wanda Comber, NP [facility] None    ------------------------------------------------------------------------------------------------------------------   HPI BP 104/70   Pulse 78   Temp 97.7 F (36.5 C)   Ht 5\' 3"  (1.6 m)   Wt 236 lb (107 kg)   SpO2 97%   BMI 41.81 kg/m   47 y.o.female presents for evaluation of joint pain; she reports history of bilateral knee, ankle, hands/wrist and right hip pain that she has had noticeably since last year, but much worse recently, and peaked last week and had leave work early. She also reports stiffness, primarily of lower extremities, particularly bad in the AM, though will have after sitting, and at school where [name] is on very high and she gets cold.  She also endorse lethargy, fatigue/dragging in the past few weeks. Denies fever/chills, hot joints, notable swelling.   She is currently taking ibuprofen for pain, typically taking 2 tabs ibuprofen PRN, takes 3-4 times a week, which improves pain from 8/10 at worst to dull 2-3/10.   She has hx of marathon running/track/dancing, injury to left ankle several years ago.   She reports maternal grandmother had RA.    Past Medical History:  Diagnosis Date  . Asthma   . Bloody discharge from left nipple 07/2017  . Irregular menstrual cycle   . Left breast mass 07/2017  . Pituitary adenoma ([facility])      Allergies  Allergen Reactions  . Banana Hives and Shortness Of Breath  . Peanut-Containing Drug Products Hives and Shortness Of Breath  . Shellfish Allergy Hives and Shortness Of Breath    Current Outpatient Medications on File Prior to Visit  Medication Sig  . albuterol (PROVENTIL HFA;VENTOLIN HFA) 108 (90 Base) MCG/ACT inhaler 1 to 2 puffs (5 minutes apart) every 4 hour as needed for Asthma  . cabergoline (DOSTINEX) 0.5 MG tablet Take 0.5 tablets (0.25 mg total) by mouth 2 (two) times a week.  . calcium carbonate (TUMS EX) 750 MG chewable tablet Chew 2 tablets by mouth daily.  . cetirizine (ZYRTEC) 10 MG tablet Take 1 tablet (10 mg total) by mouth daily.  . cholecalciferol (VITAMIN D) 1000 UNITS tablet Take 1,000 Units by mouth daily.  Marland Kitchen ibuprofen (ADVIL,MOTRIN) 600 MG tablet  Take 1 tablet (600 mg total) by mouth every 8 (eight) hours as needed for moderate pain (would use three times daily for next three days and then use as needed).  . meloxicam (MOBIC) 15 MG tablet Take one daily with food for 2 weeks, can take with tylenol, can not take with aleve, iburpofen, then as needed daily for pain  . Multiple Vitamin (MULTIVITAMIN) tablet Take 1 tablet by mouth daily.   No current facility-administered medications on file prior to visit.     ROS: all negative except above.   Physical  Exam:  BP 104/70   Pulse 78   Temp 97.7 F (36.5 C)   Ht 5\' 3"  (1.6 m)   Wt 236 lb (107 kg)   SpO2 97%   BMI 41.81 kg/m   General Appearance: Well nourished, in no apparent distress. Eyes: conjunctiva no swelling or erythema ENT/Mouth: No erythema, swelling, or exudate on post pharynx.  Tonsils not swollen or erythematous. Hearing normal.  Neck: Supple, thyroid normal.  Respiratory: Respiratory effort normal, BS equal bilaterally without rales, rhonchi, wheezing or stridor.  Cardio: RRR with no MRGs. Brisk peripheral pulses without edema.  Abdomen: Soft, + BS.  Non tender, no guarding, rebound, hernias, masses. Lymphatics: Non tender without lymphadenopathy.  Musculoskeletal: Full ROM, 5/5 strength, normal gait. She has some lateral right hip tenderness, bilateral knee crepitus, some pain with bilateral hip ROM; no notable joint effusion/heat.  Skin: Warm, dry without rashes, lesions, ecchymosis.  Neuro: Cranial nerves intact. Normal muscle tone, no cerebellar symptoms. Sensation intact.  Psych: Awake and oriented X 3, normal affect, Insight and Judgment appropriate.   Wanda Ribas, NP 5:21 PM [facility]

## 2018-01-10 ENCOUNTER — Ambulatory Visit: Payer: Self-pay | Admitting: Adult Health

## 2018-01-10 ENCOUNTER — Encounter: Payer: Self-pay | Admitting: Adult Health

## 2018-01-10 ENCOUNTER — Ambulatory Visit (INDEPENDENT_AMBULATORY_CARE_PROVIDER_SITE_OTHER): Payer: BC Managed Care – PPO | Admitting: Adult Health

## 2018-01-10 VITALS — BP 104/70 | HR 78 | Temp 97.7°F | Ht 63.0 in | Wt 236.0 lb

## 2018-01-10 DIAGNOSIS — R5383 Other fatigue: Secondary | ICD-10-CM

## 2018-01-10 DIAGNOSIS — M2550 Pain in unspecified joint: Secondary | ICD-10-CM | POA: Diagnosis not present

## 2018-01-10 MED ORDER — PREDNISONE 20 MG PO TABS: ORAL_TABLET | ORAL | 0 refills | Status: DC

## 2018-01-10 NOTE — Patient Instructions (Signed)

## 2018-01-11 LAB — B. BURGDORFI ANTIBODIES

## 2018-01-11 LAB — ANTI-SMITH ANTIBODY: ENA SM Ab Ser-aCnc: <1 AI

## 2018-01-11 LAB — CYCLIC CITRUL PEPTIDE ANTIBODY, IGG

## 2018-01-12 LAB — SEDIMENTATION RATE: Sed Rate: 29 mm/h — ABNORMAL HIGH (ref 0–20)

## 2018-01-12 LAB — RHEUMATOID FACTOR: Rhuematoid fact SerPl-aCnc: <14 IU/mL (ref <14)

## 2018-01-12 LAB — ANA: ANA: NEGATIVE

## 2018-01-12 LAB — ANTI-DNA ANTIBODY, DOUBLE-STRANDED

## 2018-01-25 ENCOUNTER — Other Ambulatory Visit: Payer: Self-pay | Admitting: Adult Health

## 2018-01-25 ENCOUNTER — Other Ambulatory Visit: Payer: Self-pay | Admitting: Endocrinology

## 2018-02-03 ENCOUNTER — Other Ambulatory Visit (INDEPENDENT_AMBULATORY_CARE_PROVIDER_SITE_OTHER): Payer: BC Managed Care – PPO

## 2018-02-03 DIAGNOSIS — D352 Benign neoplasm of pituitary gland: Secondary | ICD-10-CM | POA: Diagnosis not present

## 2018-02-04 LAB — PROLACTIN: Prolactin: 3.8 ng/mL — ABNORMAL LOW (ref 4.8–23.3)

## 2018-02-07 ENCOUNTER — Other Ambulatory Visit: Payer: BC Managed Care – PPO

## 2018-02-09 ENCOUNTER — Ambulatory Visit: Payer: BC Managed Care – PPO | Admitting: Endocrinology

## 2018-02-09 ENCOUNTER — Encounter: Payer: Self-pay | Admitting: Endocrinology

## 2018-02-09 VITALS — BP 118/78 | HR 88 | Ht 63.0 in | Wt 230.0 lb

## 2018-02-09 DIAGNOSIS — D352 Benign neoplasm of pituitary gland: Secondary | ICD-10-CM

## 2018-02-09 NOTE — Progress Notes (Signed)
Patient ID: Wanda Douglas, female   DOB: 1971/02/10, 47 y.o.   MRN: [id number]             Referring PCP: Liane Comber, NP  Chief complaint: High prolactin  History of Present Illness  In 1997 she was evaluated for missed menstrual cycles along with breast milk discharge She was told to have a high prolactin and a small pituitary adenoma She was apparently treated with bromocriptine for several years when [place] was in another [place] and this apparently was told her menstrual cycles She apparently stopped taking her bromocriptine, probably in 2017 because she was having dizziness and nausea from this     She was again advised to try this in 12/18 but could not tolerate it and treatment was stopped  Prior to her initial consultation she was having irregular menstrual cycles coming every 2 to 6 months and also milky breast discharge  RECENT history: With a baseline prolactin of 86 she has been started on cabergoline 0.25 mg twice a week  Her last menstrual cycle was in end of September and this was on time She does not complain of any milk discharge from her breast Also her headaches are better   Prolactin levels as follows:  Lab Results  Component Value Date   PROLACTIN 3.8 (L) 02/03/2018   PROLACTIN 85.5 (H) 01/04/2018   PROLACTIN 68.4 (H) 10/19/2017   PROLACTIN 52.5 (H) 04/14/2017     MRI of pituitary gland done in 06/2015 shows 73mm benign adenoma without any other changes   She is now referred here for further management  Allergies as of 02/09/2018      Reactions   Banana Hives, Shortness Of Breath   Peanut-containing Drug Products Hives, Shortness Of Breath   Shellfish Allergy Hives, Shortness Of Breath      Medication List        Accurate as of 02/09/18  4:28 PM. Always use your most recent med list.          albuterol 108 (90 Base) MCG/ACT inhaler Commonly known as:  PROVENTIL HFA;VENTOLIN HFA 1 to 2 puffs (5 minutes apart) every 4 hour as needed for  Asthma   cabergoline 0.5 MG tablet Commonly known as:  DOSTINEX TAKE 0.5 TABLETS (0.25 MG TOTAL) BY MOUTH 2 (TWO) TIMES A WEEK.   calcium carbonate 750 MG chewable tablet Commonly known as:  TUMS EX Chew 2 tablets by mouth daily.   cetirizine 10 MG tablet Commonly known as:  ZYRTEC TAKE 1 TABLET BY MOUTH EVERY DAY   cholecalciferol 1000 units tablet Commonly known as:  VITAMIN D Take 1,000 Units by mouth daily.   ibuprofen 600 MG tablet Commonly known as:  ADVIL,MOTRIN Take 1 tablet (600 mg total) by mouth every 8 (eight) hours as needed for moderate pain (would use three times daily for next three days and then use as needed).   meloxicam 15 MG tablet Commonly known as:  MOBIC Take one daily with food for 2 weeks, can take with tylenol, can not take with aleve, iburpofen, then as needed daily for pain   multivitamin tablet Take 1 tablet by mouth daily.   predniSONE 20 MG tablet Commonly known as:  DELTASONE 2 tablets daily for 3 days, 1 tablet daily for 4 days.       Allergies:  Allergies  Allergen Reactions  . Banana Hives and Shortness Of Breath  . Peanut-Containing Drug Products Hives and Shortness Of Breath  . Shellfish Allergy Hives and Shortness  Of Breath    Past Medical History:  Diagnosis Date  . Asthma   . Bloody discharge from left nipple 07/2017  . Irregular menstrual cycle   . Left breast mass 07/2017  . Pituitary adenoma [facility])     Past Surgical History:  Procedure Laterality Date  . BREAST DUCTAL SYSTEM EXCISION Left 07/26/2017   Procedure: EXCISION DUCTAL SYSTEM BREAST;  Surgeon: Rolm Bookbinder, MD;  Location: [facility];  Service: General;  Laterality: Left;  . RADIOACTIVE SEED GUIDED EXCISIONAL BREAST BIOPSY Left 07/26/2017   Procedure: LEFT RADIOACTIVE SEED GUIDED EXCISIONAL BREAST BIOPSY ERAS PATHWAY;  Surgeon: Rolm Bookbinder, MD;  Location: [facility];  Service: General;  Laterality: Left;  . right  breast biopsy Right 2019   With axillary lymph node biopsy  . TONSILLECTOMY      Family History  Problem Relation Age of Onset  . Breast cancer Paternal Grandmother   . Diabetes Paternal Grandmother   . Hypertension Mother   . Spina bifida Mother   . Drug abuse Mother   . Lung cancer Maternal Grandmother        smoker  . Rheum arthritis Maternal Grandmother   . Hypertension Maternal Grandfather   . Diabetes Maternal Grandfather     Social History:  reports that she has never smoked. She has never used smokeless tobacco. She reports that she does not drink alcohol or use drugs.   Review of Systems   She has had some fatigue but her levels have been normal  Lab Results  Component Value Date   TSH 0.88 10/19/2017   TSH 0.59 04/14/2017   TSH 0.518 05/07/2015   FREET4 0.88 01/04/2018    She is concerned about her difficulty with losing weight but is doing a little better recently  Wt Readings from Last 3 Encounters:  02/09/18 230 lb (104.3 kg)  01/10/18 236 lb (107 kg)  01/04/18 237 lb 3.2 oz (107.6 kg)    EXAM:  BP 118/78 (BP Location: Left Arm, Patient Position: Sitting, Cuff Size: Normal)   Pulse 88   Ht 5\' 3"  (1.6 m)   Wt 230 lb (104.3 kg)   SpO2 96%   BMI 40.74 kg/m   Physical Exam     ASSESSMENT:     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 Any transcriptional errors that  result from this process are unintentional.

## 2018-02-18 ENCOUNTER — Other Ambulatory Visit: Payer: Self-pay | Admitting: Adult Health

## 2018-02-18 DIAGNOSIS — M25551 Pain in right hip: Secondary | ICD-10-CM

## 2018-02-21 ENCOUNTER — Other Ambulatory Visit: Payer: Self-pay | Admitting: Adult Health

## 2018-02-21 DIAGNOSIS — M25551 Pain in right hip: Secondary | ICD-10-CM

## 2018-02-22 ENCOUNTER — Telehealth: Payer: Self-pay

## 2018-02-22 NOTE — Telephone Encounter (Signed)
I have made several attempts to contact patient to return my call but no response back yet.

## 2018-03-08 ENCOUNTER — Ambulatory Visit (INDEPENDENT_AMBULATORY_CARE_PROVIDER_SITE_OTHER): Payer: Self-pay

## 2018-03-08 ENCOUNTER — Ambulatory Visit (INDEPENDENT_AMBULATORY_CARE_PROVIDER_SITE_OTHER): Payer: BC Managed Care – PPO | Admitting: Orthopaedic Surgery

## 2018-03-08 DIAGNOSIS — M25551 Pain in right hip: Secondary | ICD-10-CM

## 2018-03-08 MED ORDER — MELOXICAM 7.5 MG PO TABS: 15.0000 mg | ORAL_TABLET | Freq: Every day | ORAL | 2 refills | Status: DC | PRN

## 2018-03-08 NOTE — Progress Notes (Signed)
Office Visit Note   Patient: Wanda Douglas           Date of Birth: 1970-09-12           MRN: [id number] Visit Date: 03/08/2018              Requested by: Liane Comber, NP [address] [place], [place] [address] PCP: Unk Pinto, MD   Assessment & Plan: Visit Diagnoses:  1. Pain in right hip     Plan: Impression is right hip trochanteric bursitis.  I offered cortisone injection but overall pain does not seem to be constant or severe therefore she would like to try some home exercises and a short course of NSAIDs.  Patient will return if this does not improve.  Follow-Up Instructions: Return if symptoms worsen or fail to improve.   Orders:  Orders Placed This Encounter  Procedures  . XR HIP UNILAT W OR W/O PELVIS 1V RIGHT   Meds ordered this encounter  Medications  . meloxicam (MOBIC) 7.5 MG tablet    Sig: Take 2 tablets (15 mg total) by mouth daily as needed for pain.    Dispense:  30 tablet    Refill:  2      Procedures: No procedures performed   Clinical Data: No additional findings.   Subjective: Chief Complaint  Patient presents with  . Right Hip - Pain    Wanda Douglas is a 47 year old female comes in with 3 weeks of right hip pain of insidious onset.  She was having a lot of trouble standing initially but this has gotten a little bit better.  She denies any back pain or groin pain.  The pain is now occasional and is worse with using stairs and laying on the right side.  Denies any numbness and tingling.   Review of Systems  Constitutional: Negative.   HENT: Negative.   Eyes: Negative.   Respiratory: Negative.   Cardiovascular: Negative.   Endocrine: Negative.   Musculoskeletal: Negative.   Neurological: Negative.   Hematological: Negative.   Psychiatric/Behavioral: Negative.   All other systems reviewed and are negative.    Objective: Vital Signs: There were no vitals taken for this visit.  Physical Exam  Constitutional:  She is oriented to person, place, and time. She appears well-developed and well-nourished.  HENT:  Head: Normocephalic and atraumatic.  Eyes: EOM are normal.  Neck: Neck supple.  Pulmonary/Chest: Effort normal.  Abdominal: Soft.  Neurological: She is alert and oriented to person, place, and time.  Skin: Skin is warm. Capillary refill takes less than 2 seconds.  Psychiatric: She has a normal mood and affect. Her behavior is normal. Judgment and thought content normal.  Nursing note and vitals reviewed.   Ortho Exam Right hip exam shows negative Stinchfield negative logroll negative straight leg raise no pain with hip abduction.  She has tenderness of the greater trochanter bursa.  Negative FADIR. Specialty Comments:  No specialty comments available.  Imaging: Xr Hip Unilat W Or W/o Pelvis 1v Right  Result Date: 03/08/2018 Minimal joint space narrowing of the right hip.  Os acetabuli.  No acute or structural abnormalities.    PMFS History: Patient Active Problem List   Diagnosis Date Noted  . Hyperlipidemia 10/18/2017  . Morbid obesity with BMI of 40.0-44.9, adult ([place]) 10/18/2017  . 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 Multiple food allergies 12/19/2013  . HX of seasonal allergies 12/19/2013  . 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 Asthma   . Bloody discharge from left nipple 07/2017  . Irregular menstrual cycle   . Left breast mass 07/2017  . Pituitary adenoma ([place])     Family History  Problem Relation Age of Onset  . Breast cancer Paternal Grandmother   . Diabetes Paternal Grandmother   . Hypertension Mother   . Spina bifida Mother   . Drug abuse Mother   . Lung cancer Maternal Grandmother        smoker  . Rheum arthritis Maternal Grandmother   . Hypertension Maternal Grandfather   . Diabetes Maternal Grandfather     Past Surgical History:  Procedure Laterality Date    . BREAST DUCTAL SYSTEM EXCISION Left 07/26/2017   Procedure: EXCISION DUCTAL SYSTEM BREAST;  Surgeon: Rolm Bookbinder, MD;  Location: [facility];  Service: General;  Laterality: Left;  . RADIOACTIVE SEED GUIDED EXCISIONAL BREAST BIOPSY Left 07/26/2017   Procedure: LEFT RADIOACTIVE SEED GUIDED EXCISIONAL BREAST BIOPSY ERAS PATHWAY;  Surgeon: Rolm Bookbinder, MD;  Location: [facility];  Service: General;  Laterality: Left;  . right breast biopsy Right 2019   With axillary lymph node biopsy  . TONSILLECTOMY     Social History   Occupational History  . Occupation: Microbiologist  Tobacco Use  . Smoking status: Never Smoker  . Smokeless tobacco: Never Used  Substance and Sexual Activity  . Alcohol use: No  . Drug use: No  . Sexual activity: Never

## 2018-04-02 ENCOUNTER — Other Ambulatory Visit: Payer: Self-pay | Admitting: Adult Health

## 2018-04-02 DIAGNOSIS — N644 Mastodynia: Secondary | ICD-10-CM

## 2018-04-02 DIAGNOSIS — N6452 Nipple discharge: Secondary | ICD-10-CM

## 2018-04-02 DIAGNOSIS — D369 Benign neoplasm, unspecified site: Secondary | ICD-10-CM

## 2018-04-02 MED ORDER — AMOXICILLIN-POT CLAVULANATE 875-125 MG PO TABS: 1.0000 | ORAL_TABLET | Freq: Two times a day (BID) | ORAL | 1 refills | Status: AC

## 2018-04-05 ENCOUNTER — Telehealth: Payer: Self-pay | Admitting: Adult Health

## 2018-04-05 ENCOUNTER — Other Ambulatory Visit: Payer: Self-pay | Admitting: Internal Medicine

## 2018-04-05 DIAGNOSIS — N6452 Nipple discharge: Secondary | ICD-10-CM

## 2018-04-05 DIAGNOSIS — N644 Mastodynia: Secondary | ICD-10-CM

## 2018-04-05 NOTE — Telephone Encounter (Signed)
per Dr Cristal Generous request, please have PCP order STAT US Right Breast, pain, swelling, mass, bloody discharge,  possible surgical Abcess. Notify Abigail Butts once patient is scheduled.

## 2018-04-06 ENCOUNTER — Ambulatory Visit
Admission: RE | Admit: 2018-04-06 | Discharge: 2018-04-06 | Disposition: A | Discharge status: Home / Self Care | Payer: BC Managed Care – PPO | Source: Ambulatory Visit | Attending: Internal Medicine | Admitting: Internal Medicine

## 2018-04-06 ENCOUNTER — Other Ambulatory Visit: Payer: Self-pay | Admitting: Internal Medicine

## 2018-04-06 DIAGNOSIS — N6452 Nipple discharge: Secondary | ICD-10-CM

## 2018-04-06 DIAGNOSIS — N644 Mastodynia: Secondary | ICD-10-CM

## 2018-04-13 ENCOUNTER — Other Ambulatory Visit: Payer: Self-pay | Admitting: General Surgery

## 2018-04-20 ENCOUNTER — Ambulatory Visit
Admission: RE | Admit: 2018-04-20 | Discharge: 2018-04-20 | Disposition: A | Discharge status: Home / Self Care | Payer: BC Managed Care – PPO | Source: Ambulatory Visit | Attending: Internal Medicine | Admitting: Internal Medicine

## 2018-04-20 ENCOUNTER — Other Ambulatory Visit: Payer: BC Managed Care – PPO

## 2018-04-20 ENCOUNTER — Other Ambulatory Visit: Payer: Self-pay | Admitting: Internal Medicine

## 2018-04-20 ENCOUNTER — Other Ambulatory Visit: Payer: Self-pay | Admitting: Endocrinology

## 2018-04-20 DIAGNOSIS — E559 Vitamin D deficiency, unspecified: Secondary | ICD-10-CM | POA: Insufficient documentation

## 2018-04-20 DIAGNOSIS — N6452 Nipple discharge: Secondary | ICD-10-CM

## 2018-04-20 DIAGNOSIS — N644 Mastodynia: Secondary | ICD-10-CM

## 2018-04-20 NOTE — Progress Notes (Signed)
FOLLOW UP  Assessment and Plan:   Intrinsic asthma Well controlled, avoid triggers, continue inhaler PRN  Prolactin secreting Pituitary Adenoma (1997) Now followed by endocrinology Continue with cabergoline, doing much better since switch from bromocriptine  Intraductal papilloma S/p excisional biopsy, negative Continue annual mammograms Established with Dr. Donne Hazel if needed   Mixed hyperlipidemia Mild elevations currently treated by lifestyle modification only Continue low cholesterol diet and exercise.  Borderline, has been working aggressively on lifestyle with successful weight loss - defer labs to CPE  Morbid obesity Long discussion about weight loss, diet, and exercise Commended her on successful ongoing weight loss Recommended diet heavy in fruits and veggies and low in animal meats, cheeses, and dairy products, appropriate calorie intake Continue meal prep, suggested she get a fitbit with goal of [address] steps, continue to push water intake Discussed appropriate weight for height, discussed initial goal (<200lb)  Follow up at next visit in 6 months  Vitamin D Def She has not initiated supplement since last visit, discussed add 2000 IU daily Defer vitamin D level to CPE   Continue diet and meds as discussed. Further disposition pending results of labs. Discussed med's effects and SE's.   Over 30 minutes of exam, counseling, chart review, and critical decision making was performed.   Future Appointments  Date Time Provider [place]  05/02/2018  7:30 AM [facility] PROCEDURES 1 [facility] GI-BREAST CE  06/08/2018  3:30 PM [facility] LAB [facility] None  06/13/2018  3:45 PM Elayne Snare, MD [facility] None  10/20/2018  2:00 PM Liane Comber, NP [facility] None    ----------------------------------------------------------------------------------------------------------------------  HPI 47 y.o. female  presents for 6 month follow up on cholesterol, weight and  vitamin D deficiency.   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 Cruzita Lederer for ongoing management, doing well on cabergoline, has follow up scheduled in Jan 2020.    She also has recurrent intraductal papilloma, most recently in 06/2017 noted 1.3cm intraductal mass at R breast 2'o clock, 1 cm from nipple for which she underwent excisional biopsy and was found to be benign. Dr. Donne Hazel was consulted but has deferred surgical intervention at this time. She recently experienced mastitis of the R breast, since resolved after doxycyline.   She is following with Dr. Erlinda Hong for severe R hip pain which was determined to be trochateric bursitis and treated by steroids and NSAID  BMI is Body mass index is 40.21 kg/m., she has been working on diet and exercise, she reports she has cut out red meat, doing meal prep with lots of fruits/veggies, lean proteins. She is walking for exercise, sleeping more consistently, getting 6-7 hours consistently. She is exclusively drinking water, drinks 4-4.5 bottles of water daily. Her weight is down from 242 lb in 10/2016, 15 lb down so far.  Wt Readings from Last 3 Encounters:  04/21/18 227 lb (103 kg)  02/09/18 230 lb (104.3 kg)  01/10/18 236 lb (107 kg)   Today their BP is BP: 110/70  She does workout. She denies chest pain, shortness of breath, dizziness.   She is not on cholesterol medication. Her cholesterol is not at goal. The cholesterol last visit was:   Lab Results  Component Value Date   CHOL 179 10/19/2017   HDL 56 10/19/2017   LDLCALC 104 (H) 10/19/2017   TRIG 97 10/19/2017   CHOLHDL 3.2 10/19/2017  Last A1C in the office was:  Lab Results  Component Value Date   HGBA1C 5.4 10/19/2017   Patient is NOT on Vitamin D supplement.   Lab Results  Component Value Date   VD25OH 27 (L) 10/19/2017        Current  Medications:  Current Outpatient Medications on File Prior to Visit  Medication Sig  . albuterol (PROVENTIL HFA;VENTOLIN HFA) 108 (90 Base) MCG/ACT inhaler 1 to 2 puffs (5 minutes apart) every 4 hour as needed for Asthma  . cabergoline (DOSTINEX) 0.5 MG tablet TAKE 0.5 TABLETS (0.25 MG TOTAL) BY MOUTH 2 (TWO) TIMES A WEEK.  . calcium carbonate (TUMS EX) 750 MG chewable tablet Chew 2 tablets by mouth daily.  . cetirizine (ZYRTEC) 10 MG tablet TAKE 1 TABLET BY MOUTH EVERY DAY  . cholecalciferol (VITAMIN D) 1000 UNITS tablet Take 1,000 Units by mouth daily.  . meloxicam (MOBIC) 7.5 MG tablet Take 2 tablets (15 mg total) by mouth daily as needed for pain.  . Multiple Vitamin (MULTIVITAMIN) tablet Take 1 tablet by mouth daily.   No current facility-administered medications on file prior to visit.      Allergies:  Allergies  Allergen Reactions  . Banana Hives and Shortness Of Breath  . Peanut-Containing Drug Products Hives and Shortness Of Breath  . Shellfish Allergy Hives and Shortness Of Breath     Medical History:  Past Medical History:  Diagnosis Date  . Asthma   . Bloody discharge from left nipple 07/2017  . Irregular menstrual cycle   . Left breast mass 07/2017  . Pituitary adenoma ([place])    Family history- Reviewed and unchanged Social history- Reviewed and unchanged   Review of Systems:  Review of Systems  Constitutional: Negative for malaise/fatigue and weight loss.  HENT: Negative for hearing loss and tinnitus.   Eyes: Negative for blurred vision and double vision.  Respiratory: Negative for cough, shortness of breath and wheezing.   Cardiovascular: Negative for chest pain, palpitations, orthopnea, claudication and leg swelling.  Gastrointestinal: Negative for abdominal pain, blood in stool, constipation, diarrhea, heartburn, melena, nausea and vomiting.  Genitourinary: Negative.   Musculoskeletal: Negative for joint pain and myalgias.  Skin: Negative for rash.   Neurological: Negative for dizziness, tingling, sensory change, weakness and headaches.  Endo/Heme/Allergies: Negative for polydipsia.  Psychiatric/Behavioral: Negative.   All other systems reviewed and are negative.   Physical Exam: BP 110/70   Pulse 79   Temp 97.7 F (36.5 C)   Ht 5\' 3"  (1.6 m)   Wt 227 lb (103 kg)   LMP 03/07/2018 (Exact Date)   SpO2 98%   BMI 40.21 kg/m  Wt Readings from Last 3 Encounters:  04/21/18 227 lb (103 kg)  02/09/18 230 lb (104.3 kg)  01/10/18 236 lb (107 kg)   General Appearance: Well nourished, in no apparent distress. Eyes: PERRLA, EOMs, conjunctiva no swelling or erythema Sinuses: No Frontal/maxillary tenderness ENT/Mouth: Ext aud canals clear, TMs without erythema, bulging. No erythema, swelling, or exudate on post pharynx.  Tonsils not swollen or erythematous. Hearing normal.  Neck: Supple, thyroid normal.  Respiratory: Respiratory effort normal, BS equal bilaterally without rales, rhonchi, wheezing or stridor.  Cardio: RRR with no MRGs. Brisk peripheral pulses without edema.  Abdomen: Soft, + BS.  Non tender, no guarding, rebound, hernias, masses. Lymphatics: Non tender without lymphadenopathy.  Musculoskeletal: Full ROM, 5/5 strength, Normal gait Skin: Warm, dry without rashes, lesions, ecchymosis.  Neuro: Cranial nerves intact. No cerebellar symptoms.  Psych: Awake  and oriented X 3, normal affect, Insight and Judgment appropriate.    Izora Ribas, NP 3:49 PM [facility]

## 2018-04-21 ENCOUNTER — Encounter: Payer: Self-pay | Admitting: Adult Health

## 2018-04-21 ENCOUNTER — Ambulatory Visit: Payer: BC Managed Care – PPO | Admitting: Adult Health

## 2018-04-21 VITALS — BP 110/70 | HR 79 | Temp 97.7°F | Ht 63.0 in | Wt 227.0 lb

## 2018-04-21 DIAGNOSIS — E782 Mixed hyperlipidemia: Secondary | ICD-10-CM

## 2018-04-21 DIAGNOSIS — D352 Benign neoplasm of pituitary gland: Secondary | ICD-10-CM

## 2018-04-21 DIAGNOSIS — Z6841 Body Mass Index (BMI) 40.0 and over, adult: Secondary | ICD-10-CM

## 2018-04-21 DIAGNOSIS — E559 Vitamin D deficiency, unspecified: Secondary | ICD-10-CM

## 2018-04-21 DIAGNOSIS — D369 Benign neoplasm, unspecified site: Secondary | ICD-10-CM | POA: Diagnosis not present

## 2018-04-21 NOTE — Patient Instructions (Signed)
Goals    . Weight (lb) < 200 lb (90.7 kg)       Aim for 7+ servings of fruits and vegetables daily  65-80+ fluid ounces of water or unsweet tea for healthy kidneys  Limit to max 1 drink of alcohol per day; avoid smoking/tobacco  Limit animal fats in diet for cholesterol and heart health - choose grass fed whenever available  Avoid highly processed foods, and foods high in saturated/trans fats  Aim for low stress - take time to unwind and care for your mental health  Aim for 150 min of moderate intensity exercise weekly for heart health, and weights twice weekly for bone health  Aim for 7-9 hours of sleep daily

## 2018-05-02 ENCOUNTER — Other Ambulatory Visit: Payer: Self-pay | Admitting: Radiology

## 2018-05-02 ENCOUNTER — Ambulatory Visit
Admission: RE | Admit: 2018-05-02 | Discharge: 2018-05-02 | Disposition: A | Discharge status: Home / Self Care | Payer: BC Managed Care – PPO | Source: Ambulatory Visit | Attending: Internal Medicine | Admitting: Internal Medicine

## 2018-05-02 ENCOUNTER — Other Ambulatory Visit: Payer: Self-pay | Admitting: Internal Medicine

## 2018-05-02 DIAGNOSIS — N644 Mastodynia: Secondary | ICD-10-CM

## 2018-05-02 DIAGNOSIS — N6452 Nipple discharge: Secondary | ICD-10-CM

## 2018-05-25 ENCOUNTER — Other Ambulatory Visit: Payer: Self-pay | Admitting: General Surgery

## 2018-05-25 DIAGNOSIS — D241 Benign neoplasm of right breast: Secondary | ICD-10-CM

## 2018-06-08 ENCOUNTER — Other Ambulatory Visit (INDEPENDENT_AMBULATORY_CARE_PROVIDER_SITE_OTHER): Payer: BC Managed Care – PPO

## 2018-06-08 DIAGNOSIS — D352 Benign neoplasm of pituitary gland: Secondary | ICD-10-CM | POA: Diagnosis not present

## 2018-06-09 ENCOUNTER — Other Ambulatory Visit: Payer: Self-pay

## 2018-06-09 ENCOUNTER — Encounter (HOSPITAL_BASED_OUTPATIENT_CLINIC_OR_DEPARTMENT_OTHER): Payer: Self-pay | Admitting: *Deleted

## 2018-06-09 LAB — PROLACTIN: Prolactin: 4 ng/mL — ABNORMAL LOW (ref 4.8–23.3)

## 2018-06-13 ENCOUNTER — Encounter: Payer: Self-pay | Admitting: Endocrinology

## 2018-06-13 ENCOUNTER — Ambulatory Visit: Payer: BC Managed Care – PPO | Admitting: Endocrinology

## 2018-06-13 VITALS — BP 122/82 | HR 85 | Temp 99.0°F | Ht 63.0 in | Wt 228.8 lb

## 2018-06-13 DIAGNOSIS — D352 Benign neoplasm of pituitary gland: Secondary | ICD-10-CM

## 2018-06-13 NOTE — Progress Notes (Signed)
Patient ID: Wanda Douglas, female   DOB: October 28, 1970, 48 y.o.   MRN: [id number]             Chief complaint: Follow-up of high prolactin  History of Present Illness  In 1997 she was evaluated for missed menstrual cycles along with breast milk discharge She was told to have a high prolactin and a small pituitary adenoma She was apparently treated with bromocriptine for several years when she was in another [place] and this apparently was told her menstrual cycles She apparently stopped taking her bromocriptine, probably in 2017 because she was having dizziness and nausea from this     She was again advised to try this in 12/18 but could not tolerate it and treatment was stopped  Prior to her initial consultation she was having irregular menstrual cycles coming every 2 to 6 months and also milky breast discharge  RECENT history: With a baseline prolactin of 86 she has been on cabergoline 0.25 mg twice a week The dose was continued unchanged in 10/19 when her prolactin level had gone down to 3.8  Her menstrual cycles have been coming every 28-30 days now regularly  She does not complain of any milk discharge from her breast Also her headaches are resolved  Prolactin is again low normal at 4.0  Prolactin levels as follows:  Lab Results  Component Value Date   PROLACTIN 4.0 (L) 06/08/2018   PROLACTIN 3.8 (L) 02/03/2018   PROLACTIN 85.5 (H) 01/04/2018   PROLACTIN 68.4 (H) 10/19/2017    MRI of pituitary gland done in 06/2017 shows 53mm benign adenoma without any other changes   She is now referred here for further management  Allergies as of 06/13/2018      Reactions   Banana Hives, Shortness Of Breath   Peanut-containing Drug Products Hives, Shortness Of Breath   All nuts (allergic) hives   Shellfish Allergy Hives, Shortness Of Breath      Medication List       Accurate as of June 13, 2018  9:02 PM. Always use your most recent med list.        albuterol 108 (90  Base) MCG/ACT inhaler Commonly known as:  PROVENTIL HFA;VENTOLIN HFA 1 to 2 puffs (5 minutes apart) every 4 hour as needed for Asthma   cabergoline 0.5 MG tablet Commonly known as:  DOSTINEX TAKE 0.5 TABLETS (0.25 MG TOTAL) BY MOUTH 2 (TWO) TIMES A WEEK.   calcium carbonate 750 MG chewable tablet Commonly known as:  TUMS EX Chew 2 tablets by mouth daily.   cetirizine 10 MG tablet Commonly known as:  ZYRTEC TAKE 1 TABLET BY MOUTH EVERY DAY   cholecalciferol 1000 units tablet Commonly known as:  VITAMIN D Take 1,000 Units by mouth daily.   meloxicam 7.5 MG tablet Commonly known as:  MOBIC Take 2 tablets (15 mg total) by mouth daily as needed for pain.   multivitamin tablet Take 1 tablet by mouth daily.       Allergies:  Allergies  Allergen Reactions  . Banana Hives and Shortness Of Breath  . Peanut-Containing Drug Products Hives and Shortness Of Breath    All nuts (allergic) hives   . Shellfish Allergy Hives and Shortness Of Breath    Past Medical History:  Diagnosis Date  . Asthma   . Bloody discharge from left nipple 07/2017  . Irregular menstrual cycle   . Left breast mass 07/2017  . Pituitary adenoma [facility])     Past Surgical History:  Procedure Laterality Date  . BREAST DUCTAL SYSTEM EXCISION Left 07/26/2017   Procedure: EXCISION DUCTAL SYSTEM BREAST;  Surgeon: Rolm Bookbinder, MD;  Location: [place];  Service: General;  Laterality: Left;  . BREAST EXCISIONAL BIOPSY Left 07/26/2017   benign papilloma  . RADIOACTIVE SEED GUIDED EXCISIONAL BREAST BIOPSY Left 07/26/2017   Procedure: LEFT RADIOACTIVE SEED GUIDED EXCISIONAL BREAST BIOPSY ERAS PATHWAY;  Surgeon: Rolm Bookbinder, MD;  Location: [place];  Service: General;  Laterality: Left;  . right breast biopsy Right 2019   With axillary lymph node biopsy  . TONSILLECTOMY      Family History  Problem Relation Age of Onset  . Breast cancer Paternal Grandmother   .  Diabetes Paternal Grandmother   . Hypertension Mother   . Spina bifida Mother   . Drug abuse Mother   . Lung cancer Maternal Grandmother        smoker  . Rheum arthritis Maternal Grandmother   . Hypertension Maternal Grandfather   . Diabetes Maternal Grandfather     Social History:  reports that she has never smoked. She has never used smokeless tobacco. She reports that she does not drink alcohol or use drugs.   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  She has been irregularly treated in the past and could not tolerate bromocriptine  However with cabergoline 0.25 mg twice a week since early September she has has had symptomatic improvement and regular menstrual cycles along with control of galactorrhea and headaches  Again prolactin level is now slightly below normal, baseline level 85     PLAN:     She will be given a trial of 0.25 mg cabergoline only once a week on Sundays Discussed that if she has persistently low prolactin levels on the lower dose may may be able to stop her medication However otherwise may plan to continue treating her until she achieves menopause Discussed that her prolactin level and pituitary tumor size should be proportionate and repeat MRI not necessary  Follow-up with repeat prolactin in 4 months  Orry Sigl 06/13/18   Note: This office note was  prepared with Dragon voice recognition system technology. Any transcriptional errors that result from this process are unintentional.

## 2018-06-13 NOTE — Progress Notes (Signed)
Patient given Ensure pre-surgery with instructions, voiced understanding without questions.

## 2018-06-15 ENCOUNTER — Ambulatory Visit
Admission: RE | Admit: 2018-06-15 | Discharge: 2018-06-15 | Disposition: A | Discharge status: Home / Self Care | Payer: BC Managed Care – PPO | Source: Ambulatory Visit | Attending: General Surgery | Admitting: General Surgery

## 2018-06-15 DIAGNOSIS — D241 Benign neoplasm of right breast: Secondary | ICD-10-CM

## 2018-06-16 ENCOUNTER — Ambulatory Visit (HOSPITAL_BASED_OUTPATIENT_CLINIC_OR_DEPARTMENT_OTHER)
Admission: RE | Admit: 2018-06-16 | Discharge: 2018-06-16 | Disposition: A | Discharge status: Home / Self Care | Payer: BC Managed Care – PPO | Attending: General Surgery | Admitting: General Surgery

## 2018-06-16 ENCOUNTER — Encounter (HOSPITAL_BASED_OUTPATIENT_CLINIC_OR_DEPARTMENT_OTHER)
Admission: RE | Disposition: A | Discharge status: Home / Self Care | Payer: Self-pay | Source: Home / Self Care | Attending: General Surgery

## 2018-06-16 ENCOUNTER — Encounter (HOSPITAL_BASED_OUTPATIENT_CLINIC_OR_DEPARTMENT_OTHER): Payer: Self-pay | Admitting: *Deleted

## 2018-06-16 ENCOUNTER — Ambulatory Visit (HOSPITAL_BASED_OUTPATIENT_CLINIC_OR_DEPARTMENT_OTHER): Payer: BC Managed Care – PPO | Admitting: Anesthesiology

## 2018-06-16 ENCOUNTER — Ambulatory Visit
Admission: RE | Admit: 2018-06-16 | Discharge: 2018-06-16 | Disposition: A | Discharge status: Home / Self Care | Payer: BC Managed Care – PPO | Source: Ambulatory Visit | Attending: General Surgery | Admitting: General Surgery

## 2018-06-16 DIAGNOSIS — Z79899 Other long term (current) drug therapy: Secondary | ICD-10-CM | POA: Diagnosis not present

## 2018-06-16 DIAGNOSIS — N6341 Unspecified lump in right breast, subareolar: Secondary | ICD-10-CM | POA: Insufficient documentation

## 2018-06-16 DIAGNOSIS — Z6841 Body Mass Index (BMI) 40.0 and over, adult: Secondary | ICD-10-CM | POA: Diagnosis not present

## 2018-06-16 DIAGNOSIS — N6310 Unspecified lump in the right breast, unspecified quadrant: Secondary | ICD-10-CM | POA: Diagnosis present

## 2018-06-16 DIAGNOSIS — J45909 Unspecified asthma, uncomplicated: Secondary | ICD-10-CM | POA: Diagnosis not present

## 2018-06-16 DIAGNOSIS — D241 Benign neoplasm of right breast: Secondary | ICD-10-CM

## 2018-06-16 HISTORY — PX: RADIOACTIVE SEED GUIDED EXCISIONAL BREAST BIOPSY: SHX6490

## 2018-06-16 SURGERY — RADIOACTIVE SEED GUIDED BREAST BIOPSY
Anesthesia: General | Laterality: Right

## 2018-06-16 MED ORDER — EPHEDRINE SULFATE 50 MG/ML IJ SOLN
INTRAMUSCULAR | Status: DC | PRN
  Administered 2018-06-16 (×3): 10 mg via INTRAVENOUS

## 2018-06-16 MED ORDER — MIDAZOLAM HCL 2 MG/2ML IJ SOLN
INTRAMUSCULAR | Status: AC
  Filled 2018-06-16: qty 2

## 2018-06-16 MED ORDER — ONDANSETRON HCL 4 MG/2ML IJ SOLN: 4.0000 mg | Freq: Once | INTRAMUSCULAR | Status: DC | PRN

## 2018-06-16 MED ORDER — PROPOFOL 10 MG/ML IV BOLUS
INTRAVENOUS | Status: DC | PRN
  Administered 2018-06-16: 150 mg via INTRAVENOUS

## 2018-06-16 MED ORDER — GABAPENTIN 100 MG PO CAPS
100.0000 mg | ORAL_CAPSULE | ORAL | Status: AC
  Administered 2018-06-16: 100 mg via ORAL

## 2018-06-16 MED ORDER — MIDAZOLAM HCL 2 MG/2ML IJ SOLN
1.0000 mg | INTRAMUSCULAR | Status: DC | PRN
  Administered 2018-06-16: 2 mg via INTRAVENOUS

## 2018-06-16 MED ORDER — GABAPENTIN 100 MG PO CAPS
ORAL_CAPSULE | ORAL | Status: AC
  Filled 2018-06-16: qty 1

## 2018-06-16 MED ORDER — LIDOCAINE 2% (20 MG/ML) 5 ML SYRINGE
INTRAMUSCULAR | Status: AC
  Filled 2018-06-16: qty 5

## 2018-06-16 MED ORDER — ACETAMINOPHEN 500 MG PO TABS
ORAL_TABLET | ORAL | Status: AC
  Filled 2018-06-16: qty 2

## 2018-06-16 MED ORDER — BUPIVACAINE HCL (PF) 0.25 % IJ SOLN
INTRAMUSCULAR | Status: DC | PRN
  Administered 2018-06-16: 7 mL

## 2018-06-16 MED ORDER — ONDANSETRON HCL 4 MG/2ML IJ SOLN
INTRAMUSCULAR | Status: AC
  Filled 2018-06-16: qty 2

## 2018-06-16 MED ORDER — FENTANYL CITRATE (PF) 100 MCG/2ML IJ SOLN
50.0000 ug | INTRAMUSCULAR | Status: DC | PRN
  Administered 2018-06-16: 100 ug via INTRAVENOUS

## 2018-06-16 MED ORDER — KETOROLAC TROMETHAMINE 15 MG/ML IJ SOLN
15.0000 mg | INTRAMUSCULAR | Status: AC
  Administered 2018-06-16: 15 mg via INTRAVENOUS

## 2018-06-16 MED ORDER — FENTANYL CITRATE (PF) 100 MCG/2ML IJ SOLN: 25.0000 ug | INTRAMUSCULAR | Status: DC | PRN

## 2018-06-16 MED ORDER — FENTANYL CITRATE (PF) 100 MCG/2ML IJ SOLN
INTRAMUSCULAR | Status: AC
  Filled 2018-06-16: qty 2

## 2018-06-16 MED ORDER — LIDOCAINE 2% (20 MG/ML) 5 ML SYRINGE
INTRAMUSCULAR | Status: DC | PRN
  Administered 2018-06-16: 50 mg via INTRAVENOUS

## 2018-06-16 MED ORDER — DEXAMETHASONE SODIUM PHOSPHATE 4 MG/ML IJ SOLN
INTRAMUSCULAR | Status: DC | PRN
  Administered 2018-06-16: 10 mg via INTRAVENOUS

## 2018-06-16 MED ORDER — EPHEDRINE 5 MG/ML INJ
INTRAVENOUS | Status: AC
  Filled 2018-06-16: qty 10

## 2018-06-16 MED ORDER — DEXAMETHASONE SODIUM PHOSPHATE 10 MG/ML IJ SOLN
INTRAMUSCULAR | Status: AC
  Filled 2018-06-16: qty 1

## 2018-06-16 MED ORDER — CEFAZOLIN SODIUM-DEXTROSE 2-4 GM/100ML-% IV SOLN
2.0000 g | INTRAVENOUS | Status: AC
  Administered 2018-06-16: 2 g via INTRAVENOUS

## 2018-06-16 MED ORDER — TRAMADOL HCL 50 MG PO TABS: 100.0000 mg | ORAL_TABLET | Freq: Four times a day (QID) | ORAL | 0 refills | Status: DC | PRN

## 2018-06-16 MED ORDER — PROPOFOL 500 MG/50ML IV EMUL
INTRAVENOUS | Status: AC
  Filled 2018-06-16: qty 50

## 2018-06-16 MED ORDER — BUPIVACAINE HCL (PF) 0.25 % IJ SOLN
INTRAMUSCULAR | Status: AC
  Filled 2018-06-16: qty 30

## 2018-06-16 MED ORDER — CEFAZOLIN SODIUM-DEXTROSE 2-4 GM/100ML-% IV SOLN
INTRAVENOUS | Status: AC
  Filled 2018-06-16: qty 100

## 2018-06-16 MED ORDER — ONDANSETRON HCL 4 MG/2ML IJ SOLN
INTRAMUSCULAR | Status: DC | PRN
  Administered 2018-06-16: 4 mg via INTRAVENOUS

## 2018-06-16 MED ORDER — LACTATED RINGERS IV SOLN
INTRAVENOUS | Status: DC
  Administered 2018-06-16: 09:00:00 via INTRAVENOUS

## 2018-06-16 MED ORDER — KETOROLAC TROMETHAMINE 15 MG/ML IJ SOLN
INTRAMUSCULAR | Status: AC
  Filled 2018-06-16: qty 1

## 2018-06-16 MED ORDER — ENSURE PRE-SURGERY PO LIQD: 296.0000 mL | Freq: Once | ORAL | Status: DC

## 2018-06-16 MED ORDER — ACETAMINOPHEN 500 MG PO TABS
1000.0000 mg | ORAL_TABLET | ORAL | Status: AC
  Administered 2018-06-16: 1000 mg via ORAL

## 2018-06-16 MED ORDER — SCOPOLAMINE 1 MG/3DAYS TD PT72: 1.0000 | MEDICATED_PATCH | Freq: Once | TRANSDERMAL | Status: DC | PRN

## 2018-06-16 SURGICAL SUPPLY — 63 items
ADH SKN CLS APL DERMABOND .7 (GAUZE/BANDAGES/DRESSINGS) ×1
APPLIER CLIP 9.375 MED OPEN (MISCELLANEOUS)
APR CLP MED 9.3 20 MLT OPN (MISCELLANEOUS)
BINDER BREAST LRG (GAUZE/BANDAGES/DRESSINGS) IMPLANT
BINDER BREAST MEDIUM (GAUZE/BANDAGES/DRESSINGS) IMPLANT
BINDER BREAST XLRG (GAUZE/BANDAGES/DRESSINGS) IMPLANT
BINDER BREAST XXLRG (GAUZE/BANDAGES/DRESSINGS) ×2 IMPLANT
BLADE SURG 15 STRL LF DISP TIS (BLADE) ×1 IMPLANT
BLADE SURG 15 STRL SS (BLADE) ×3
CANISTER SUC SOCK COL 7IN (MISCELLANEOUS) IMPLANT
CANISTER SUCT 1200ML W/VALVE (MISCELLANEOUS) IMPLANT
CHLORAPREP W/TINT 26ML (MISCELLANEOUS) ×3 IMPLANT
CLIP APPLIE 9.375 MED OPEN (MISCELLANEOUS) IMPLANT
CLIP VESOCCLUDE SM WIDE 6/CT (CLIP) IMPLANT
CLOSURE WOUND 1/2 X4 (GAUZE/BANDAGES/DRESSINGS) ×1
COVER BACK TABLE 60X90IN (DRAPES) ×3 IMPLANT
COVER MAYO STAND STRL (DRAPES) ×3 IMPLANT
COVER PROBE W GEL 5X96 (DRAPES) ×3 IMPLANT
COVER WAND RF STERILE (DRAPES) IMPLANT
DECANTER SPIKE VIAL GLASS SM (MISCELLANEOUS) IMPLANT
DERMABOND ADVANCED (GAUZE/BANDAGES/DRESSINGS) ×2
DERMABOND ADVANCED .7 DNX12 (GAUZE/BANDAGES/DRESSINGS) ×1 IMPLANT
DRAPE LAPAROSCOPIC ABDOMINAL (DRAPES) ×3 IMPLANT
DRAPE UTILITY XL STRL (DRAPES) ×3 IMPLANT
DRSG TEGADERM 4X4.75 (GAUZE/BANDAGES/DRESSINGS) IMPLANT
ELECT COATED BLADE 2.86 ST (ELECTRODE) ×3 IMPLANT
ELECT REM PT RETURN 9FT ADLT (ELECTROSURGICAL) ×3
ELECTRODE REM PT RTRN 9FT ADLT (ELECTROSURGICAL) ×1 IMPLANT
GAUZE SPONGE 4X4 12PLY STRL LF (GAUZE/BANDAGES/DRESSINGS) IMPLANT
GLOVE BIO SURGEON STRL SZ7 (GLOVE) ×6 IMPLANT
GLOVE BIOGEL M 6.5 STRL (GLOVE) ×4 IMPLANT
GLOVE BIOGEL PI IND STRL 6.5 (GLOVE) IMPLANT
GLOVE BIOGEL PI IND STRL 7.5 (GLOVE) ×1 IMPLANT
GLOVE BIOGEL PI INDICATOR 6.5 (GLOVE) ×4
GLOVE BIOGEL PI INDICATOR 7.5 (GLOVE) ×2
GOWN STRL REUS W/ TWL LRG LVL3 (GOWN DISPOSABLE) ×2 IMPLANT
GOWN STRL REUS W/TWL LRG LVL3 (GOWN DISPOSABLE) ×6
HEMOSTAT ARISTA ABSORB 3G PWDR (HEMOSTASIS) IMPLANT
ILLUMINATOR WAVEGUIDE N/F (MISCELLANEOUS) IMPLANT
KIT MARKER MARGIN INK (KITS) ×3 IMPLANT
LIGHT WAVEGUIDE WIDE FLAT (MISCELLANEOUS) IMPLANT
NDL HYPO 25X1 1.5 SAFETY (NEEDLE) ×1 IMPLANT
NEEDLE HYPO 25X1 1.5 SAFETY (NEEDLE) ×3 IMPLANT
NS IRRIG 1000ML POUR BTL (IV SOLUTION) IMPLANT
PACK BASIN DAY SURGERY FS (CUSTOM PROCEDURE TRAY) ×3 IMPLANT
PENCIL BUTTON HOLSTER BLD 10FT (ELECTRODE) ×3 IMPLANT
SLEEVE SCD COMPRESS KNEE MED (MISCELLANEOUS) ×3 IMPLANT
SPONGE LAP 4X18 RFD (DISPOSABLE) ×3 IMPLANT
STRIP CLOSURE SKIN 1/2X4 (GAUZE/BANDAGES/DRESSINGS) ×2 IMPLANT
SUT MNCRL AB 4-0 PS2 18 (SUTURE) IMPLANT
SUT MON AB 5-0 PS2 18 (SUTURE) ×2 IMPLANT
SUT SILK 2 0 SH (SUTURE) IMPLANT
SUT VIC AB 2-0 SH 27 (SUTURE) ×3
SUT VIC AB 2-0 SH 27XBRD (SUTURE) ×1 IMPLANT
SUT VIC AB 3-0 SH 27 (SUTURE) ×3
SUT VIC AB 3-0 SH 27X BRD (SUTURE) ×1 IMPLANT
SYR CONTROL 10ML LL (SYRINGE) ×3 IMPLANT
TAPE STRIPS DRAPE STRL (GAUZE/BANDAGES/DRESSINGS) ×2 IMPLANT
TOWEL GREEN STERILE FF (TOWEL DISPOSABLE) ×3 IMPLANT
TRAY FAXITRON CT DISP (TRAY / TRAY PROCEDURE) ×3 IMPLANT
TUBE CONNECTING 20'X1/4 (TUBING)
TUBE CONNECTING 20X1/4 (TUBING) IMPLANT
YANKAUER SUCT BULB TIP NO VENT (SUCTIONS) IMPLANT

## 2018-06-16 NOTE — Anesthesia Preprocedure Evaluation (Signed)
Anesthesia Evaluation  Patient identified by MRN, date of birth, ID band Patient awake    Reviewed: Allergy & Precautions, NPO status , Patient's Chart, lab work & pertinent test results  Airway Mallampati: II  TM Distance: >3 FB Neck ROM: Full    Dental  (+) Dental Advisory Given   Pulmonary asthma ,    breath sounds clear to auscultation       Cardiovascular negative cardio ROS   Rhythm:Regular Rate:Normal     Neuro/Psych negative neurological ROS     GI/Hepatic negative GI ROS, Neg liver ROS,   Endo/Other  Morbid obesity  Renal/GU negative Renal ROS     Musculoskeletal   Abdominal   Peds  Hematology negative hematology ROS (+)   Anesthesia Other Findings   Reproductive/Obstetrics                             Lab Results  Component Value Date   WBC 8.1 10/19/2017   HGB 12.3 10/19/2017   HCT 37.5 10/19/2017   MCV 82.6 10/19/2017   PLT 214 10/19/2017   Lab Results  Component Value Date   CREATININE 0.83 10/19/2017   BUN 14 10/19/2017   NA 138 10/19/2017   K 4.0 10/19/2017   CL 105 10/19/2017   CO2 27 10/19/2017    Anesthesia Physical Anesthesia Plan  ASA: III  Anesthesia Plan: General   Post-op Pain Management:    Induction: Intravenous  PONV Risk Score and Plan: 3 and Dexamethasone, Ondansetron, Treatment may vary due to age or medical condition, Midazolam and Scopolamine patch - Pre-op  Airway Management Planned: LMA  Additional Equipment:   Intra-op Plan:   Post-operative Plan: Extubation in OR  Informed Consent: I have reviewed the patients History and Physical, chart, labs and discussed the procedure including the risks, benefits and alternatives for the proposed anesthesia with the patient or authorized representative who has indicated his/her understanding and acceptance.     Dental advisory given  Plan Discussed with: CRNA  Anesthesia Plan  Comments:         Anesthesia Quick Evaluation

## 2018-06-16 NOTE — Anesthesia Postprocedure Evaluation (Signed)
Anesthesia Post Note  Patient: Wanda Douglas  Procedure(s) Performed: RADIOACTIVE SEED GUIDED EXCISIONAL RIGHT BREAST BIOPSY (Right )     Patient location during evaluation: PACU Anesthesia Type: General Level of consciousness: awake and alert Pain management: pain level controlled Vital Signs Assessment: post-procedure vital signs reviewed and stable Respiratory status: spontaneous breathing, nonlabored ventilation and respiratory function stable Cardiovascular status: blood pressure returned to baseline and stable Postop Assessment: no apparent nausea or vomiting Anesthetic complications: no    Last Vitals:  Vitals:   06/16/18 1100 06/16/18 1115  BP: 115/80 123/79  Pulse: 80 73  Resp: 16 18  Temp:  36.6 C  SpO2: 100% 100%    Last Pain:  Vitals:   06/16/18 1115  TempSrc:   PainSc: 0-No pain                 Lynda Rainwater

## 2018-06-16 NOTE — Op Note (Signed)
Preoperative diagnosis: Prior right breast mastitis with intraductal mass and core biopsy consistent with papilloma Postoperative diagnosis: Same as above Procedure: Right breast radioactive seed guided excisional biopsy and central duct excision Surgeon: Dr. Serita Grammes Anesthesia: General Estimated blood loss: Minimal Complications: None Drains: None Specimens: Right breast seed, clip, and mass marked with paint Additional central ductal tissue on marked due to size Special count was correct at completion Disposition to recovery stable  Indications: This a 48 year old female I know well from a prior biopsy.  She had a right breast mastitis that we got to resolve with antibiotics.  On subsequent ultrasound she had a intraductal mass that was biopsied was a papilloma.  I discussed her options and we discussed going to the operating room for seed guided excision.  Procedure: After informed consent was obtained the patient was taken the operating room.  I confirmed the presence of the seed.  I had the mammograms in the operating room.  I that she was then placed under general anesthesia.  She was given antibiotics.  SCDs were in place.  Her right breast was prepped and draped with Betadine paint due to an allergy.  Surgical timeout was then performed.  I then located the seed in the retroareolar position made a periareolar incision in order to hide the scar later after infiltrating Marcaine.  I then dissected down to the seed.  There was a lot of inspissated ducts as well as inflamed tissue present.  I then remove the seed and the surrounding tissue.  This was confirmed by mammography.  The clip was also removed.  I then did proceed to remove some additional central ductal tissue due to the amount of inflammation in the inspissated material.  I then irrigated this copiously.  I closed the tissue with 2-0 Vicryl.  The skin was closed with 3-0 Vicryl and 5-0 Monocryl.  Glue and Steri-Strips were  applied.  She tolerated this well was extubated and transferred to recovery stable.

## 2018-06-16 NOTE — Discharge Instructions (Signed)
[place] Office Phone Number [phone]  POST OP INSTRUCTIONS Take 400 mg of ibuprofen every 8 hours or 650 mg tylenol every 6 hours for next 72 hours then as needed. Use ice several times daily also. Always review your discharge instruction sheet given to you by the facility where your surgery was performed.  IF YOU HAVE DISABILITY OR FAMILY LEAVE FORMS, YOU MUST BRING THEM TO THE OFFICE FOR PROCESSING.  DO NOT GIVE THEM TO YOUR DOCTOR.  1. A prescription for pain medication may be given to you upon discharge.  Take your pain medication as prescribed, if needed.  If narcotic pain medicine is not needed, then you may take acetaminophen (Tylenol), naprosyn (Alleve) or ibuprofen (Advil) as needed. 2. Take your usually prescribed medications unless otherwise directed 3. If you need a refill on your pain medication, please contact your pharmacy.  They will contact our office to request authorization.  Prescriptions will not be filled after 5pm or on week-ends. 4. You should eat very light the first 24 hours after surgery, such as soup, crackers, pudding, etc.  Resume your normal diet the day after surgery. 5. Most patients will experience some swelling and bruising in the breast.  Ice packs and a good support bra will help.  Wear the breast binder provided or a sports bra for 72 hours day and night.  After that wear a sports bra during the day until you return to the office. Swelling and bruising can take several days to resolve.  6. It is common to experience some constipation if taking pain medication after surgery.  Increasing fluid intake and taking a stool softener will usually help or prevent this problem from occurring.  A mild laxative (Milk of Magnesia or Miralax) should be taken according to package directions if there are no bowel movements after 48 hours. 7. Unless discharge instructions indicate otherwise, you may remove your bandages 48 hours after surgery and you may  shower at that time.  You may have steri-strips (small skin tapes) in place directly over the incision.  These strips should be left on the skin for 7-10 days and will come off on their own.  If your surgeon used skin glue on the incision, you may shower in 24 hours.  The glue will flake off over the next 2-3 weeks.  Any sutures or staples will be removed at the office during your follow-up visit. 8. ACTIVITIES:  You may resume regular daily activities (gradually increasing) beginning the next day.  Wearing a good support bra or sports bra minimizes pain and swelling.  You may have sexual intercourse when it is comfortable. a. You may drive when you no longer are taking prescription pain medication, you can comfortably wear a seatbelt, and you can safely maneuver your car and apply brakes. b. RETURN TO WORK:  ______________________________________________________________________________________ 9. You should see your doctor in the office for a follow-up appointment approximately two weeks after your surgery.  Your doctors nurse will typically make your follow-up appointment when she calls you with your pathology report.  Expect your pathology report 3-4 business days after your surgery.  You may call to check if you do not hear from [place] after three days. 10. OTHER INSTRUCTIONS: _______________________________________________________________________________________________ _____________________________________________________________________________________________________________________________________ _____________________________________________________________________________________________________________________________________ _____________________________________________________________________________________________________________________________________  WHEN TO CALL DR WAKEFIELD: 1. Fever over 101.0 2. Nausea and/or vomiting. 3. Extreme swelling or bruising. 4. Continued bleeding from  incision. 5. Increased pain, redness, or drainage from the incision.  The clinic staff is available to  answer your questions during regular business hours.  Please don’t hesitate to call and ask to speak to one of the nurses for clinical concerns.  If you have a medical emergency, go to the nearest emergency room or call 911.  A surgeon from [facility] is always on call at the hospital. ° °For further questions, please visit [email or web address] ° ° ° ° °Post Anesthesia Home Care Instructions ° °Activity: °Get plenty of rest for the remainder of the day. A responsible individual must stay with you for 24 hours following the procedure.  °For the next 24 hours, DO NOT: °-Drive a car °-Operate machinery °-Drink alcoholic beverages °-Take any medication unless instructed by your physician °-Make any legal decisions or sign important papers. ° °Meals: °Start with liquid foods such as gelatin or soup. Progress to regular foods as tolerated. Avoid greasy, spicy, heavy foods. If nausea and/or vomiting occur, drink only clear liquids until the nausea and/or vomiting subsides. Call your physician if vomiting continues. ° °Special Instructions/Symptoms: °Your throat may feel dry or sore from the anesthesia or the breathing tube placed in your throat during surgery. If this causes discomfort, gargle with warm salt water. The discomfort should disappear within 24 hours. ° °If you had a scopolamine patch placed behind your ear for the management of post- operative nausea and/or vomiting: ° °1. The medication in the patch is effective for 72 hours, after which it should be removed.  Wrap patch in a tissue and discard in the trash. Wash hands thoroughly with soap and water. °2. You may remove the patch earlier than 72 hours if you experience unpleasant side effects which may include dry mouth, dizziness or visual disturbances. °3. Avoid touching the patch. Wash your hands with soap and water after contact  with the patch. °   ° °

## 2018-06-16 NOTE — Transfer of Care (Signed)
Immediate Anesthesia Transfer of Care Note  Patient: Wanda Douglas  Procedure(s) Performed: RADIOACTIVE SEED GUIDED EXCISIONAL RIGHT BREAST BIOPSY (Right )  Patient Location: PACU  Anesthesia Type:General  Level of Consciousness: awake and sedated  Airway & Oxygen Therapy: Patient Spontanous Breathing and Patient connected to face mask oxygen  Post-op Assessment: Report given to RN and Post -op Vital signs reviewed and stable  Post vital signs: Reviewed and stable  Last Vitals:  Vitals Value Taken Time  BP 120/76 06/16/2018 10:33 AM  Temp    Pulse 79 06/16/2018 10:34 AM  Resp 23 06/16/2018 10:34 AM  SpO2 100 % 06/16/2018 10:34 AM  Vitals shown include unvalidated device data.  Last Pain:  Vitals:   06/16/18 0838  TempSrc:   PainSc: 2          Complications: No apparent anesthesia complications

## 2018-06-16 NOTE — H&P (Signed)
Wanda Douglas is an 48 y.o. female.   Chief Complaint: right breast mass HPI:  87 yof referred by Wanda Douglas for right breast pain and erythema. I know her from prior left breast excision. she developed acute pain/ redness in the right breast a couple weeks ago. she has purulent discharge. I saw pictures of both that she had taken. she was seen in the breast center. she has d density breasts. on mm and [place] she had extensive retroareolar duct ectasia on the right. she had nothing focal to drain. was treated with doxy which she is near completion. this is almost completely resolved clinically. no more dc, nontender, redness all gone. she then had a follow up mm/us for this. this showed a 1.5 cm mass in the retroareolar right breast. the cortical thickening of node likely related to mastitis is improved. she underwent core biopsy of the mass and is a papilloma. this was recommended for excision   Past Medical History:  Diagnosis Date  . Asthma   . Bloody discharge from left nipple 07/2017  . Irregular menstrual cycle   . Left breast mass 07/2017  . Pituitary adenoma [facility])     Past Surgical History:  Procedure Laterality Date  . BREAST DUCTAL SYSTEM EXCISION Left 07/26/2017   Procedure: EXCISION DUCTAL SYSTEM BREAST;  Surgeon: Rolm Bookbinder, MD;  Location: [place];  Service: General;  Laterality: Left;  . BREAST EXCISIONAL BIOPSY Left 07/26/2017   benign papilloma  . RADIOACTIVE SEED GUIDED EXCISIONAL BREAST BIOPSY Left 07/26/2017   Procedure: LEFT RADIOACTIVE SEED GUIDED EXCISIONAL BREAST BIOPSY ERAS PATHWAY;  Surgeon: Rolm Bookbinder, MD;  Location: [place];  Service: General;  Laterality: Left;  . right breast biopsy Right 2019   With axillary lymph node biopsy  . TONSILLECTOMY      Family History  Problem Relation Age of Onset  . Breast cancer Paternal Grandmother   . Diabetes Paternal Grandmother   . Hypertension Mother   . Spina  bifida Mother   . Drug abuse Mother   . Lung cancer Maternal Grandmother        smoker  . Rheum arthritis Maternal Grandmother   . Hypertension Maternal Grandfather   . Diabetes Maternal Grandfather    Social History:  reports that she has never smoked. She has never used smokeless tobacco. She reports that she does not drink alcohol or use drugs.  Allergies:  Allergies  Allergen Reactions  . Banana Hives and Shortness Of Breath  . Peanut-Containing Drug Products Hives and Shortness Of Breath    All nuts (allergic) hives   . Shellfish Allergy Hives and Shortness Of Breath  . Chlorhexidine Hives    Medications Prior to Admission  Medication Sig Dispense Refill  . cabergoline (DOSTINEX) 0.5 MG tablet TAKE 0.5 TABLETS (0.25 MG TOTAL) BY MOUTH 2 (TWO) TIMES A WEEK. 12 tablet 0  . calcium carbonate (TUMS EX) 750 MG chewable tablet Chew 2 tablets by mouth daily.    . cetirizine (ZYRTEC) 10 MG tablet TAKE 1 TABLET BY MOUTH EVERY DAY 90 tablet 1  . cholecalciferol (VITAMIN D) 1000 UNITS tablet Take 1,000 Units by mouth daily.    . meloxicam (MOBIC) 7.5 MG tablet Take 2 tablets (15 mg total) by mouth daily as needed for pain. 30 tablet 2  . Multiple Vitamin (MULTIVITAMIN) tablet Take 1 tablet by mouth daily.    Marland Kitchen albuterol (PROVENTIL HFA;VENTOLIN HFA) 108 (90 Base) MCG/ACT inhaler 1 to 2 puffs (5 minutes  apart) every 4 hour as needed for Asthma 18 g 1    No results found for this or any previous visit (from the past 48 hour(s)). Mm Rt Radioactive Seed Loc Mammo Guide  Result Date: 06/15/2018 CLINICAL DATA:  Localization of a biopsy clip prior to surgery EXAM: MAMMOGRAPHIC GUIDED RADIOACTIVE SEED LOCALIZATION OF THE RIGHT BREAST COMPARISON:  Previous exam(s). FINDINGS: Patient presents for radioactive seed localization prior to surgery. I met with the patient and we discussed the procedure of seed localization including benefits and alternatives. We discussed the high likelihood of a  successful procedure. We discussed the risks of the procedure including infection, bleeding, tissue injury and further surgery. We discussed the low dose of radioactivity involved in the procedure. Informed, written consent was given. The usual time-out protocol was performed immediately prior to the procedure. Using mammographic guidance, sterile technique, 1% lidocaine and an I-125 radioactive seed, the coil shaped biopsy clip was localized using a superior approach. The follow-up mammogram images confirm the seed in the expected location and were marked for the surgeon. Follow-up survey of the patient confirms presence of the radioactive seed. Order number of I-125 seed:  [id number]. Total activity:  3.557 millicuries reference Date: May 24, 2018 The patient tolerated the procedure well and was released from the [place]. She was given instructions regarding seed removal. IMPRESSION: Radioactive seed localization right breast. No apparent complications. Electronically Signed   By: Dorise Bullion III M.D   On: 06/15/2018 13:25    Review of Systems  All other systems reviewed and are negative.  Blood pressure 101/62, pulse 71, temperature 98.6 F (37 C), temperature source Oral, resp. rate 18, height _0  (1.6 m), weight 102.7 kg, last menstrual period 05/25/2018, SpO2 99 %. Physical Exam  Breast Nipples Discharge - Right - None. Breast Lump-No Palpable Breast Mass. cv rrr Lungs clear  Assessment/Plan Assessment & Plan Rolm Bookbinder MD; 05/27/2018 2:02 PM) PAPILLOMA OF BREAST (D24.9) Story: seed guided excision of papilloma right breast not sure if associated with mastitis she clinically had but this is abnormal with dc. I think excision reasonable and she agrees. discussed seed guided excision with risks, recovery, will proceed  Rolm Bookbinder, MD 06/16/2018, 9:23 AM

## 2018-06-16 NOTE — Anesthesia Procedure Notes (Signed)
Procedure Name: LMA Insertion Performed by: Adison Jerger W, CRNA Pre-anesthesia Checklist: Patient identified, Emergency Drugs available, Suction available and Patient being monitored Patient Re-evaluated:Patient Re-evaluated prior to induction Oxygen Delivery Method: Circle system utilized Preoxygenation: Pre-oxygenation with 100% oxygen Induction Type: IV induction Ventilation: Mask ventilation without difficulty LMA: LMA inserted LMA Size: 4.0 Number of attempts: 1 Placement Confirmation: positive ETCO2 Tube secured with: Tape Dental Injury: Teeth and Oropharynx as per pre-operative assessment        

## 2018-06-16 NOTE — Interval H&P Note (Signed)
History and Physical Interval Note:  06/16/2018 9:24 AM  Wanda Douglas  has presented today for surgery, with the diagnosis of RIGHT BREAST MASS  The various methods of treatment have been discussed with the patient and family. After consideration of risks, benefits and other options for treatment, the patient has consented to  Procedure(s): RADIOACTIVE SEED GUIDED EXCISIONAL RIGHT BREAST BIOPSY (Right) as a surgical intervention .  The patient's history has been reviewed, patient examined, no change in status, stable for surgery.  I have reviewed the patient's chart and labs.  Questions were answered to the patient's satisfaction.     Rolm Bookbinder

## 2018-06-17 ENCOUNTER — Encounter (HOSPITAL_BASED_OUTPATIENT_CLINIC_OR_DEPARTMENT_OTHER): Payer: Self-pay | Admitting: General Surgery

## 2018-07-02 ENCOUNTER — Telehealth: Payer: Self-pay | Admitting: General Surgery

## 2018-07-02 NOTE — Telephone Encounter (Signed)
Pt called stating she was ~2 wks out from breast surgery with Dr Donne Hazel.  She has been having some drainage from her wound.  She has been seen in the office for this and according to her, it was not felt to be infected.  She states that over the last 24 hrs she has had an increase in drainage (clear, yellow in color) and slight increase in pain.  She denies any increased erythema or pain.  I told her that it did not sound like it was infected but I could not tell for sure without looking at it.  If she was concerned that it was getting worse, I recommended that she go to urgent care or the ED to have it evaluated.  Otherwise, we would be glad to set up a wound check on Mon.

## 2018-07-14 ENCOUNTER — Other Ambulatory Visit: Payer: Self-pay

## 2018-07-14 MED ORDER — CABERGOLINE 0.5 MG PO TABS: 0.2500 mg | ORAL_TABLET | ORAL | 0 refills | Status: DC

## 2018-07-28 ENCOUNTER — Other Ambulatory Visit: Payer: Self-pay | Admitting: Adult Health

## 2018-07-29 ENCOUNTER — Other Ambulatory Visit: Payer: Self-pay

## 2018-07-29 ENCOUNTER — Emergency Department (HOSPITAL_COMMUNITY)
Admission: EM | Admit: 2018-07-29 | Discharge: 2018-07-29 | Disposition: A | Discharge status: Home / Self Care | Payer: BC Managed Care – PPO | Source: Home / Self Care | Attending: Emergency Medicine | Admitting: Emergency Medicine

## 2018-07-29 ENCOUNTER — Encounter (HOSPITAL_COMMUNITY): Payer: Self-pay | Admitting: Emergency Medicine

## 2018-07-29 ENCOUNTER — Emergency Department (HOSPITAL_COMMUNITY)
Admission: EM | Admit: 2018-07-29 | Discharge: 2018-07-29 | Disposition: A | Discharge status: Home / Self Care | Payer: BC Managed Care – PPO | Attending: Emergency Medicine | Admitting: Emergency Medicine

## 2018-07-29 DIAGNOSIS — Z791 Long term (current) use of non-steroidal anti-inflammatories (NSAID): Secondary | ICD-10-CM | POA: Insufficient documentation

## 2018-07-29 DIAGNOSIS — J45909 Unspecified asthma, uncomplicated: Secondary | ICD-10-CM | POA: Insufficient documentation

## 2018-07-29 DIAGNOSIS — Z79899 Other long term (current) drug therapy: Secondary | ICD-10-CM | POA: Diagnosis not present

## 2018-07-29 DIAGNOSIS — Z91010 Allergy to peanuts: Secondary | ICD-10-CM

## 2018-07-29 DIAGNOSIS — L03211 Cellulitis of face: Secondary | ICD-10-CM | POA: Insufficient documentation

## 2018-07-29 DIAGNOSIS — L0201 Cutaneous abscess of face: Secondary | ICD-10-CM | POA: Insufficient documentation

## 2018-07-29 DIAGNOSIS — L0291 Cutaneous abscess, unspecified: Secondary | ICD-10-CM

## 2018-07-29 MED ORDER — IBUPROFEN 800 MG PO TABS
800.0000 mg | ORAL_TABLET | Freq: Once | ORAL | Status: AC
  Administered 2018-07-29: 800 mg via ORAL
  Filled 2018-07-29: qty 1

## 2018-07-29 MED ORDER — DOXYCYCLINE HYCLATE 100 MG PO CAPS: 100.0000 mg | ORAL_CAPSULE | Freq: Two times a day (BID) | ORAL | 0 refills | Status: DC

## 2018-07-29 MED ORDER — IBUPROFEN 800 MG PO TABS: 800.0000 mg | ORAL_TABLET | Freq: Three times a day (TID) | ORAL | 0 refills | Status: DC

## 2018-07-29 MED ORDER — DOXYCYCLINE HYCLATE 100 MG PO TABS
100.0000 mg | ORAL_TABLET | Freq: Once | ORAL | Status: AC
  Administered 2018-07-29: 100 mg via ORAL
  Filled 2018-07-29: qty 1

## 2018-07-29 NOTE — Discharge Instructions (Signed)
Take the prescribed medication as directed.  Warm compresses a few times a day when possible, 10-15 mins each time is good.   Follow-up with your primary care doctor. Return to the ED for new or worsening symptoms.

## 2018-07-29 NOTE — ED Triage Notes (Signed)
Patient stated "I think my right eye got more swollen this morning". Patient was this d/c 2 hours ago.

## 2018-07-29 NOTE — ED Provider Notes (Signed)
[facility] DEPT Provider Note   CSN: [id number] Arrival date & time: 07/29/18  8469    History   Chief Complaint No chief complaint on file.   HPI Wanda Douglas is a 48 y.o. female.     48 year old female returns to the ER for right side facial swelling and cellulitis.  Patient was seen in the ER 2 hours ago and sent home antibiotics, took 1 dose of antibiotics and woke up with what she felt was worsening swelling around her eye.  She denies drainage or pain with movement of her eye.  No other complaints or concerns.     Past Medical History:  Diagnosis Date  . Asthma   . Bloody discharge from left nipple 07/2017  . Irregular menstrual cycle   . Left breast mass 07/2017  . Pituitary adenoma [facility])     Patient Active Problem List   Diagnosis Date Noted  . Vitamin D deficiency 04/20/2018  . Hyperlipidemia 10/18/2017  . Morbid obesity with BMI of 40.0-44.9, adult ([name]) 10/18/2017  . 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 Multiple food allergies 12/19/2013  . HX of seasonal allergies 12/19/2013  . 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 BREAST DUCTAL SYSTEM EXCISION Left 07/26/2017   Procedure: EXCISION DUCTAL SYSTEM BREAST;  Surgeon: Rolm Bookbinder, MD;  Location: [place];  Service: General;  Laterality: Left;  . BREAST EXCISIONAL BIOPSY Left 07/26/2017   benign papilloma  . RADIOACTIVE SEED GUIDED EXCISIONAL BREAST BIOPSY Left 07/26/2017   Procedure: LEFT RADIOACTIVE SEED GUIDED EXCISIONAL BREAST BIOPSY ERAS PATHWAY;  Surgeon: Rolm Bookbinder, MD;  Location: [place];  Service: General;  Laterality: Left;  . RADIOACTIVE SEED GUIDED EXCISIONAL BREAST BIOPSY Right 06/16/2018   Procedure: RADIOACTIVE SEED GUIDED EXCISIONAL RIGHT BREAST BIOPSY;  Surgeon: Rolm Bookbinder, MD;  Location: [place];  Service: General;  Laterality: Right;  . right breast biopsy Right 2019   With axillary lymph node biopsy  . TONSILLECTOMY       OB History   No obstetric history on file.      Home Medications    Prior to Admission medications   Medication Sig Start Date End Date Taking? Authorizing Provider  albuterol (PROVENTIL HFA;VENTOLIN HFA) 108 (90 Base) MCG/ACT inhaler 1 to 2 puffs (5 minutes apart) every 4 hour as needed for Asthma 11/02/17   Liane Comber, NP  cabergoline (DOSTINEX) 0.5 MG tablet Take 0.5 tablets (0.25 mg total) by mouth once a week. 07/14/18   Elayne Snare, MD  calcium carbonate (TUMS EX) 750 MG chewable tablet Chew 2 tablets by mouth daily.    [provider]  cetirizine (ZYRTEC) 10 MG tablet TAKE 1 TABLET BY MOUTH EVERY DAY 07/28/18   Liane Comber, NP  cholecalciferol (VITAMIN D) 1000 UNITS tablet Take 1,000 Units by mouth daily.    [provider]  doxycycline (VIBRAMYCIN) 100 MG capsule Take 1 capsule (100 mg total) by mouth 2 (two) times daily. 07/29/18   Larene Pickett, PA-C  ibuprofen (ADVIL,MOTRIN) 800 MG tablet Take 1 tablet (800 mg total) by mouth 3 (three) times daily. 07/29/18   Larene Pickett, PA-C  meloxicam (MOBIC) 7.5 MG tablet Take 2 tablets (15 mg total) by mouth daily as needed for pain. 03/08/18   Leandrew Koyanagi, MD  Multiple Vitamin (MULTIVITAMIN) tablet Take 1 tablet by mouth daily.  [provider]  traMADol (ULTRAM) 50 MG tablet Take 2 tablets (100 mg total) by mouth every 6 (six) hours as needed. 06/16/18   Rolm Bookbinder, MD    Family History Family History  Problem Relation Age of Onset  . Breast cancer Paternal Grandmother   . Diabetes Paternal Grandmother   . Hypertension Mother   . Spina bifida Mother   . Drug abuse Mother   . Lung cancer Maternal Grandmother        smoker  . Rheum arthritis Maternal Grandmother   . Hypertension Maternal Grandfather   . Diabetes Maternal Grandfather      Social History Social History   Tobacco Use  . Smoking status: Never Smoker  . Smokeless tobacco: Never Used  Substance Use Topics  . Alcohol use: No  . Drug use: No     Allergies   Banana; Peanut-containing drug products; Shellfish allergy; and Chlorhexidine   Review of Systems Review of Systems  Constitutional: Negative for fever.  Musculoskeletal: Negative for neck pain and neck stiffness.  Skin: Negative for wound.  Allergic/Immunologic: Negative for immunocompromised state.  Hematological: Negative for adenopathy.  Psychiatric/Behavioral: Negative for confusion.  All other systems reviewed and are negative.    Physical Exam Updated Vital Signs BP 120/82 (BP Location: Right Arm)   Pulse 70   Temp 98.7 F (37.1 C) (Oral)   Resp 20   Physical Exam Vitals signs and nursing note reviewed.  Constitutional:      General: She is not in acute distress.    Appearance: She is well-developed. She is not diaphoretic.  HENT:     Head: Atraumatic.   Eyes:     Extraocular Movements: Extraocular movements intact.     Pupils: Pupils are equal, round, and reactive to light.  Pulmonary:     Effort: Pulmonary effort is normal.  Skin:    General: Skin is warm and dry.     Findings: Erythema present.  Neurological:     Mental Status: She is alert and oriented to person, place, and time.  Psychiatric:        Behavior: Behavior normal.      ED Treatments / Results  Labs (all labs ordered are listed, but only abnormal results are displayed) Labs Reviewed - No data to display  EKG None  Radiology No results found.  Procedures Procedures (including critical care time)  Medications Ordered in ED Medications - No data to display   Initial Impression / Assessment and Plan / ED Course  I have reviewed the triage vital signs and the nursing notes.  Pertinent labs & imaging results that were available during my care of the patient were reviewed by me and  considered in my medical decision making (see chart for details).  Clinical Course as of Jul 29 618  Fri Jul 29, 2018  0618 47yo female previously seen this morning for right facial cellulitis/early abscess, given doxycycline and referral for outpatient follow up. Patient returns 2 hours later for swelling to her right lower eye lid. Advised patient to apply warm compresses, continue with her doxycyline, follow up as planned.   [LM]    Clinical Course User Index [LM] Tacy Learn, PA-C   Final Clinical Impressions(s) / ED Diagnoses   Final diagnoses:  Facial cellulitis    ED Discharge Orders    None       Roque Lias 07/29/18 0814    Palumbo, April, MD 07/29/18 [phone]

## 2018-07-29 NOTE — ED Triage Notes (Signed)
Pt BIB POV. Pt has swelling and pressure to the right lateral portion of her face by her right eye. Pt stated it started 2 days ago and woke her up tonight when it became more swollen and painful. Appears to be an abscess and is currently not draining.

## 2018-07-29 NOTE — Discharge Instructions (Addendum)
Follow up at previously directed. Apply Noxzema pads to area. Apply warm compresses to face for 20 minutes at a time.

## 2018-07-29 NOTE — ED Provider Notes (Signed)
[facility] DEPT Provider Note   CSN: [id number] Arrival date & time: 07/29/18  0122    History   Chief Complaint Chief Complaint  Patient presents with  . Abscess    HPI Wanda Douglas is a 48 y.o. female.     The history is provided by the patient and medical records.  Abscess     48 y.o. F with hx of asthma, irregular menses, pituitary adenoma, presenting to the ED for abscess to right side of face lateral to right eye.  States this first started Tuesday, seemed like a moderate sized pimple that was tender to palpation.  States since then, area has gotten large and more tender.  No issue opening/closing the eye, difficulty swallowing, dental pain, sore throat, or other URI symptoms.  Patient denies hx of DM, MRSA, or other chronic skin issues.  No fever/chills.  No intervention tried PTA.  Past Medical History:  Diagnosis Date  . Asthma   . Bloody discharge from left nipple 07/2017  . Irregular menstrual cycle   . Left breast mass 07/2017  . Pituitary adenoma [facility])     Patient Active Problem List   Diagnosis Date Noted  . Vitamin D deficiency 04/20/2018  . Hyperlipidemia 10/18/2017  . Morbid obesity with BMI of 40.0-44.9, adult ([place]) 10/18/2017  . 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 Multiple food allergies 12/19/2013  . HX of seasonal allergies 12/19/2013  . 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 BREAST DUCTAL SYSTEM EXCISION Left 07/26/2017   Procedure: EXCISION DUCTAL SYSTEM BREAST;  Surgeon: Rolm Bookbinder, MD;  Location: [place];  Service: General;  Laterality: Left;  . BREAST EXCISIONAL BIOPSY Left 07/26/2017   benign papilloma  . RADIOACTIVE SEED GUIDED EXCISIONAL BREAST BIOPSY Left 07/26/2017   Procedure: LEFT RADIOACTIVE SEED GUIDED EXCISIONAL BREAST BIOPSY ERAS PATHWAY;  Surgeon: Rolm Bookbinder,  MD;  Location: [place];  Service: General;  Laterality: Left;  . RADIOACTIVE SEED GUIDED EXCISIONAL BREAST BIOPSY Right 06/16/2018   Procedure: RADIOACTIVE SEED GUIDED EXCISIONAL RIGHT BREAST BIOPSY;  Surgeon: Rolm Bookbinder, MD;  Location: [place];  Service: General;  Laterality: Right;  . right breast biopsy Right 2019   With axillary lymph node biopsy  . TONSILLECTOMY       OB History   No obstetric history on file.      Home Medications    Prior to Admission medications   Medication Sig Start Date End Date Taking? Authorizing Provider  albuterol (PROVENTIL HFA;VENTOLIN HFA) 108 (90 Base) MCG/ACT inhaler 1 to 2 puffs (5 minutes apart) every 4 hour as needed for Asthma 11/02/17   Liane Comber, NP  cabergoline (DOSTINEX) 0.5 MG tablet Take 0.5 tablets (0.25 mg total) by mouth once a week. 07/14/18   Elayne Snare, MD  calcium carbonate (TUMS EX) 750 MG chewable tablet Chew 2 tablets by mouth daily.    [provider]  cetirizine (ZYRTEC) 10 MG tablet TAKE 1 TABLET BY MOUTH EVERY DAY 07/28/18   Liane Comber, NP  cholecalciferol (VITAMIN D) 1000 UNITS tablet Take 1,000 Units by mouth daily.    [provider]  meloxicam (MOBIC) 7.5 MG tablet Take 2 tablets (15 mg total) by mouth daily as needed for pain. 03/08/18   Leandrew Koyanagi, MD  Multiple Vitamin (MULTIVITAMIN) tablet Take 1 tablet by mouth daily.    [provider]  traMADol (ULTRAM) 50 MG tablet Take 2 tablets (100 mg total) by mouth every 6 (six) hours as needed. 06/16/18   Rolm Bookbinder, MD    Family History Family History  Problem Relation Age of Onset  . Breast cancer Paternal Grandmother   . Diabetes Paternal Grandmother   . Hypertension Mother   . Spina bifida Mother   . Drug abuse Mother   . Lung cancer Maternal Grandmother        smoker  . Rheum arthritis Maternal Grandmother   . Hypertension Maternal Grandfather   . Diabetes Maternal Grandfather      Social History Social History   Tobacco Use  . Smoking status: Never Smoker  . Smokeless tobacco: Never Used  Substance Use Topics  . Alcohol use: No  . Drug use: No     Allergies   Banana; Peanut-containing drug products; Shellfish allergy; and Chlorhexidine   Review of Systems Review of Systems  Skin:       abscess  All other systems reviewed and are negative.    Physical Exam Updated Vital Signs BP 122/90 (BP Location: Right Arm)   Pulse 76   Temp 98.8 F (37.1 C) (Oral)   Resp 16   SpO2 100%   Physical Exam Vitals signs and nursing note reviewed.  Constitutional:      Appearance: She is well-developed.  HENT:     Head: Normocephalic and atraumatic.     Comments: Small, approx 1cm abscess near right lateral canthus of the eye but not directly connecting; area is firm without fluctuance but tender to palpation; there is no drainage; no cellulitic changes or streaking of the face; some mild puffiness beneath right eye but no bruising, lid drooping, etc. (reports some allergy symptoms recently as well) Eyes:     Conjunctiva/sclera: Conjunctivae normal.     Pupils: Pupils are equal, round, and reactive to light.  Neck:     Musculoskeletal: Normal range of motion.  Cardiovascular:     Rate and Rhythm: Normal rate and regular rhythm.     Heart sounds: Normal heart sounds.  Pulmonary:     Effort: Pulmonary effort is normal.     Breath sounds: Normal breath sounds.  Abdominal:     General: Bowel sounds are normal.     Palpations: Abdomen is soft.  Musculoskeletal: Normal range of motion.  Skin:    General: Skin is warm and dry.  Neurological:     Mental Status: She is alert and oriented to person, place, and time.      ED Treatments / Results  Labs (all labs ordered are listed, but only abnormal results are displayed) Labs Reviewed - No data to display  EKG None  Radiology No results found.  Procedures Procedures (including critical care  time)  Medications Ordered in ED Medications  doxycycline (VIBRA-TABS) tablet 100 mg (100 mg Oral Given 07/29/18 0210)  ibuprofen (ADVIL,MOTRIN) tablet 800 mg (800 mg Oral Given 07/29/18 0210)     Initial Impression / Assessment and Plan / ED Course  I have reviewed the triage vital signs and the nursing notes.  Pertinent labs & imaging results that were available during my care of the patient were reviewed by me and considered in my medical decision making (see chart for details).  48 year old female here with small abscess of the right side of face near right eye lateral canthus, but does not fully connect to the eye.  She is afebrile and nontoxic in appearance.  Abscess is  small, firm, but tender to palpation.  No appreciable fluctuance or drainage noted.  No cellulitic changes.  No lid edema or erythema, no drooping of the eyelids.  Some puffiness beneath right eye, however has been having issues with allergies as well.  Given location of abscess on her face, am hesitant to perform I&D for cosmetic reasons.  There is also no fluctuance so do not feel there will be much drainage from this.  Patient areeable and wants to avoid I&D of face if possible.  Will try course of doxycycline along with warm compresses at home.  She understands if worsening, may ultimately require I&D in the future.  She will follow-up with PCP.  Return here for any new/acute changes.     Final Clinical Impressions(s) / ED Diagnoses   Final diagnoses:  Abscess    ED Discharge Orders         Ordered    doxycycline (VIBRAMYCIN) 100 MG capsule  2 times daily     07/29/18 0248    ibuprofen (ADVIL,MOTRIN) 800 MG tablet  3 times daily     07/29/18 0248           Larene Pickett, PA-C 07/29/18 0326    Palumbo, April, MD 07/29/18 [phone]

## 2018-09-03 ENCOUNTER — Emergency Department (HOSPITAL_COMMUNITY): Payer: BC Managed Care – PPO

## 2018-09-03 ENCOUNTER — Encounter (HOSPITAL_COMMUNITY): Payer: Self-pay | Admitting: Emergency Medicine

## 2018-09-03 ENCOUNTER — Emergency Department (HOSPITAL_COMMUNITY)
Admission: EM | Admit: 2018-09-03 | Discharge: 2018-09-03 | Disposition: A | Discharge status: Home / Self Care | Payer: BC Managed Care – PPO | Attending: Emergency Medicine | Admitting: Emergency Medicine

## 2018-09-03 ENCOUNTER — Other Ambulatory Visit: Payer: Self-pay

## 2018-09-03 DIAGNOSIS — Z79899 Other long term (current) drug therapy: Secondary | ICD-10-CM | POA: Diagnosis not present

## 2018-09-03 DIAGNOSIS — R059 Cough, unspecified: Secondary | ICD-10-CM

## 2018-09-03 DIAGNOSIS — Z853 Personal history of malignant neoplasm of breast: Secondary | ICD-10-CM | POA: Diagnosis not present

## 2018-09-03 DIAGNOSIS — Z91010 Allergy to peanuts: Secondary | ICD-10-CM | POA: Insufficient documentation

## 2018-09-03 DIAGNOSIS — R05 Cough: Secondary | ICD-10-CM | POA: Diagnosis present

## 2018-09-03 DIAGNOSIS — J45909 Unspecified asthma, uncomplicated: Secondary | ICD-10-CM | POA: Insufficient documentation

## 2018-09-03 MED ORDER — FLUTICASONE PROPIONATE 50 MCG/ACT NA SUSP: 1.0000 | Freq: Every day | NASAL | 0 refills | Status: AC

## 2018-09-03 NOTE — ED Triage Notes (Signed)
Pt c/o persistent dry cough x 1 week. Pt with hx of asthma and has been using her inhaler more often recently and suffering from allergies. Pt also c/o loose stool x 1 day. Denies fever.

## 2018-09-03 NOTE — ED Provider Notes (Signed)
[facility] DEPT Provider Note   CSN: [id number] Arrival date & time: 09/03/18  1329    History   Chief Complaint Chief Complaint  Patient presents with  . Cough    HPI Jaquelynn Wanamaker is a 48 y.o. female.     48 y.o female with a PMH of Asthma presents to the ED with a chief complaint of cough x 1 week. Patient reports she's had a dry cough with no sputum which is worse in the morning and at night. She also endorses rhinorrhea worse in the morning and at night. Reports she tried her grandmothers recipe which helped with her symptoms last night. Patient has been using her inhaler along with taking cetrizine with improvement of symptoms. She also endorses three episode of loose stool without blood.  Of note, patient had a papillary growth to her right breast which was removed by Dr. Donne Hazel, she is currently on day 5 of 7 for prophylactic infection treatment. Patient states having a follow up appointment on Friday with Dr. Donne Hazel. She denies any fever, chest pain, shortness of breath, abdominal pain or known exposure to covid 19.      Past Medical History:  Diagnosis Date  . Asthma   . Bloody discharge from left nipple 07/2017  . Irregular menstrual cycle   . Left breast mass 07/2017  . Pituitary adenoma [facility])     Patient Active Problem List   Diagnosis Date Noted  . Vitamin D deficiency 04/20/2018  . Hyperlipidemia 10/18/2017  . Morbid obesity with BMI of 40.0-44.9, adult ([place]) 10/18/2017  . 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 Multiple food allergies 12/19/2013  . HX of seasonal allergies 12/19/2013  . 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 BREAST DUCTAL SYSTEM EXCISION Left 07/26/2017   Procedure: EXCISION DUCTAL SYSTEM BREAST;  Surgeon: Rolm Bookbinder, MD;  Location: [place];  Service: General;  Laterality:  Left;  . BREAST EXCISIONAL BIOPSY Left 07/26/2017   benign papilloma  . RADIOACTIVE SEED GUIDED EXCISIONAL BREAST BIOPSY Left 07/26/2017   Procedure: LEFT RADIOACTIVE SEED GUIDED EXCISIONAL BREAST BIOPSY ERAS PATHWAY;  Surgeon: Rolm Bookbinder, MD;  Location: [place];  Service: General;  Laterality: Left;  . RADIOACTIVE SEED GUIDED EXCISIONAL BREAST BIOPSY Right 06/16/2018   Procedure: RADIOACTIVE SEED GUIDED EXCISIONAL RIGHT BREAST BIOPSY;  Surgeon: Rolm Bookbinder, MD;  Location: [place];  Service: General;  Laterality: Right;  . right breast biopsy Right 2019   With axillary lymph node biopsy  . TONSILLECTOMY       OB History   No obstetric history on file.      Home Medications    Prior to Admission medications   Medication Sig Start Date End Date Taking? Authorizing Provider  albuterol (PROVENTIL HFA;VENTOLIN HFA) 108 (90 Base) MCG/ACT inhaler 1 to 2 puffs (5 minutes apart) every 4 hour as needed for Asthma 11/02/17   Liane Comber, NP  cabergoline (DOSTINEX) 0.5 MG tablet Take 0.5 tablets (0.25 mg total) by mouth once a week. 07/14/18   Elayne Snare, MD  calcium carbonate (TUMS EX) 750 MG chewable tablet Chew 2 tablets by mouth daily.    [provider]  cetirizine (ZYRTEC) 10 MG tablet TAKE 1 TABLET BY MOUTH EVERY DAY 07/28/18   Liane Comber, NP  cholecalciferol (VITAMIN D) 1000 UNITS tablet Take 1,000 Units by mouth daily.    [provider]  doxycycline (VIBRAMYCIN) 100 MG capsule Take 1 capsule (100 mg total) by mouth 2 (two) times daily. 07/29/18   Larene Pickett, PA-C  fluticasone (FLONASE) 50 MCG/ACT nasal spray Place 1 spray into both nostrils daily for 3 days. 09/03/18 09/06/18  Janeece Fitting, PA-C  ibuprofen (ADVIL,MOTRIN) 800 MG tablet Take 1 tablet (800 mg total) by mouth 3 (three) times daily. 07/29/18   Larene Pickett, PA-C  meloxicam (MOBIC) 7.5 MG tablet Take 2 tablets (15 mg total) by mouth daily as needed for  pain. 03/08/18   Leandrew Koyanagi, MD  Multiple Vitamin (MULTIVITAMIN) tablet Take 1 tablet by mouth daily.    [provider]  traMADol (ULTRAM) 50 MG tablet Take 2 tablets (100 mg total) by mouth every 6 (six) hours as needed. 06/16/18   Rolm Bookbinder, MD    Family History Family History  Problem Relation Age of Onset  . Breast cancer Paternal Grandmother   . Diabetes Paternal Grandmother   . Hypertension Mother   . Spina bifida Mother   . Drug abuse Mother   . Lung cancer Maternal Grandmother        smoker  . Rheum arthritis Maternal Grandmother   . Hypertension Maternal Grandfather   . Diabetes Maternal Grandfather     Social History Social History   Tobacco Use  . Smoking status: Never Smoker  . Smokeless tobacco: Never Used  Substance Use Topics  . Alcohol use: No  . Drug use: No     Allergies   Banana; Peanut-containing drug products; Shellfish allergy; and Chlorhexidine   Review of Systems Review of Systems  Constitutional: Negative for fever.  Respiratory: Positive for cough. Negative for shortness of breath.   Cardiovascular: Negative for chest pain and leg swelling.     Physical Exam Updated Vital Signs BP 124/80   Pulse 73   Temp 98.4 F (36.9 C) (Oral)   Resp 18   LMP 08/08/2018 (Exact Date)   SpO2 100%   Physical Exam Vitals signs and nursing note reviewed.  Constitutional:      General: She is not in acute distress.    Appearance: She is well-developed.  HENT:     Head: Normocephalic and atraumatic.     Nose:     Comments: No sinus tenderness along maxillary or frontal.     Mouth/Throat:     Pharynx: Oropharynx is clear. Uvula midline. No oropharyngeal exudate.     Comments: No tonsillar abscess, exudates, or swelling.  Eyes:     Pupils: Pupils are equal, round, and reactive to light.  Neck:     Musculoskeletal: Normal range of motion.  Cardiovascular:     Rate and Rhythm: Regular rhythm.     Heart sounds: Normal heart  sounds.  Pulmonary:     Effort: Pulmonary effort is normal. No respiratory distress.     Breath sounds: Normal breath sounds.     Comments: Lungs are cleared to auscultation, no rhonchi, rales or wheezing.  Chest:    Abdominal:     General: Bowel sounds are normal. There is no distension.     Palpations: Abdomen is soft.     Tenderness: There is no abdominal tenderness.  Musculoskeletal:        General: No tenderness or deformity.     Right lower leg: No edema.     Left lower leg: No edema.  Skin:    General: Skin is warm and dry.  Neurological:  Mental Status: She is alert and oriented to person, place, and time.      ED Treatments / Results  Labs (all labs ordered are listed, but only abnormal results are displayed) Labs Reviewed - No data to display  EKG None  Radiology Dg Chest 2 View  Result Date: 09/03/2018 CLINICAL DATA:  Persistent dry cough for 1 week EXAM: CHEST - 2 VIEW COMPARISON:  None. FINDINGS: The heart size and mediastinal contours are within normal limits. Both lungs are clear. The visualized skeletal structures are unremarkable. IMPRESSION: No active cardiopulmonary disease. Electronically Signed   By: Kathreen Devoid   On: 09/03/2018 15:10    Procedures Procedures (including critical care time)  Medications Ordered in ED Medications - No data to display   Initial Impression / Assessment and Plan / ED Course  I have reviewed the triage vital signs and the nursing notes.  Pertinent labs & imaging results that were available during my care of the patient were reviewed by me and considered in my medical decision making (see chart for details).   Patient with a PMH of Asthma, Allergies presents to the ED with a chief complaint of cough x [redacted] week along with loose bowel movements yesterday. Reports taking her medication for allergies with improvement in symptoms. States she tried her grandmothers remedy which helped her with the symptoms as well. According  to patient's records she reported improvement in her bowel movement yesterday. She tried obtaining screening for Covid 19 over the telephone but did not obtain success.  She is currently on amoxicillin therapy for her recent right breast surgery, has completed 5/7 days of the therapy.  Patient shows concern for COVID-19, explained her that we are currently not testing in an outpatient basis.   Chest x-ray showed: No consolidation, pneumothorax, pleural effusion.  Patient has been informed of her results, she is encouraged to use some Flonase to help with her symptoms, suspect she is likely suffering from allergies as she has had no fever, shortness of breath and reports a dry cough worse in the mornings and at night.  Patient understands we currently cannot test therapy as we are not testing an outpatient basis at this time.  Return precautions discussed at length.  Caprice Red Zangara was evaluated in Emergency Department on 09/03/2018 for the symptoms described in the history of present illness. She was evaluated in the context of the global COVID-19 pandemic, which necessitated consideration that the patient might be at risk for infection with the SARS-CoV-2 virus that causes COVID-19. Institutional protocols and algorithms that pertain to the evaluation of patients at risk for COVID-19 are in a state of rapid change based on information released by regulatory bodies including the CDC and federal and state organizations. These policies and algorithms were followed during the patient's care in the ED.   Portions of this note were generated with [employer]. Dictation errors may occur despite best attempts at proofreading.    Final Clinical Impressions(s) / ED Diagnoses   Final diagnoses:  Cough    ED Discharge Orders         Ordered    fluticasone (FLONASE) 50 MCG/ACT nasal spray  Daily     09/03/18 1542           Janeece Fitting, PA-C 09/03/18 [place], MD 09/08/18  1615

## 2018-09-03 NOTE — Discharge Instructions (Signed)
I have prescribed Flonase to help with your symptoms, please use as directed. If you experience any shortness of breath, chest pain or worsening symptoms please return to the ED.

## 2018-09-05 ENCOUNTER — Other Ambulatory Visit: Payer: Self-pay | Admitting: Adult Health

## 2018-09-05 MED ORDER — FLUTICASONE FUROATE-VILANTEROL 200-25 MCG/INH IN AEPB: 1.0000 | INHALATION_SPRAY | Freq: Every day | RESPIRATORY_TRACT | 99 refills | Status: DC

## 2018-09-05 MED ORDER — ALBUTEROL SULFATE HFA 108 (90 BASE) MCG/ACT IN AERS: INHALATION_SPRAY | RESPIRATORY_TRACT | 1 refills | Status: DC

## 2018-09-06 ENCOUNTER — Other Ambulatory Visit: Payer: Self-pay | Admitting: Adult Health

## 2018-09-06 DIAGNOSIS — J45909 Unspecified asthma, uncomplicated: Secondary | ICD-10-CM

## 2018-09-06 MED ORDER — ALBUTEROL SULFATE HFA 108 (90 BASE) MCG/ACT IN AERS: INHALATION_SPRAY | RESPIRATORY_TRACT | 1 refills | Status: DC

## 2018-09-14 ENCOUNTER — Other Ambulatory Visit: Payer: Self-pay | Admitting: Adult Health

## 2018-09-14 DIAGNOSIS — J4521 Mild intermittent asthma with (acute) exacerbation: Secondary | ICD-10-CM

## 2018-09-14 DIAGNOSIS — J45909 Unspecified asthma, uncomplicated: Secondary | ICD-10-CM

## 2018-09-14 MED ORDER — PREDNISONE 20 MG PO TABS: ORAL_TABLET | ORAL | 0 refills | Status: DC

## 2018-09-26 ENCOUNTER — Other Ambulatory Visit: Payer: Self-pay | Admitting: Adult Health

## 2018-09-26 MED ORDER — FEXOFENADINE HCL 180 MG PO TABS: 180.0000 mg | ORAL_TABLET | Freq: Every day | ORAL | 2 refills | Status: DC

## 2018-09-26 MED ORDER — AZELASTINE HCL 0.1 % NA SOLN: 2.0000 | Freq: Two times a day (BID) | NASAL | 2 refills | Status: DC

## 2018-09-26 MED ORDER — FAMOTIDINE 20 MG PO TABS: 20.0000 mg | ORAL_TABLET | Freq: Two times a day (BID) | ORAL | 2 refills | Status: DC

## 2018-10-10 ENCOUNTER — Other Ambulatory Visit: Payer: BC Managed Care – PPO

## 2018-10-14 ENCOUNTER — Ambulatory Visit: Payer: BC Managed Care – PPO | Admitting: Endocrinology

## 2018-10-15 ENCOUNTER — Other Ambulatory Visit: Payer: Self-pay | Admitting: Endocrinology

## 2018-10-19 ENCOUNTER — Ambulatory Visit: Payer: BC Managed Care – PPO | Admitting: Endocrinology

## 2018-10-19 ENCOUNTER — Encounter: Payer: Self-pay | Admitting: Endocrinology

## 2018-10-19 ENCOUNTER — Other Ambulatory Visit: Payer: Self-pay

## 2018-10-19 DIAGNOSIS — D352 Benign neoplasm of pituitary gland: Secondary | ICD-10-CM

## 2018-10-19 NOTE — Progress Notes (Addendum)
Patient ID: Wanda Douglas, female   DOB: 08-17-1970, 48 y.o.   MRN: [id number]             Chief complaint: Follow-up of high prolactin  History of Present Illness  In 1997 she was evaluated for missed menstrual cycles along with breast milk discharge She was told to have a high prolactin and a small pituitary adenoma She was apparently treated with bromocriptine for several years when she was in another [place] and this apparently was told her menstrual cycles She apparently stopped taking her bromocriptine, probably in 2017 because she was having dizziness and nausea from this     She was again advised to try this in 12/18 but could not tolerate it and treatment was stopped  Prior to her initial consultation she was having irregular menstrual cycles coming every 2 to 6 months and also milky breast discharge  RECENT history: With a baseline prolactin of 86 she started back on cabergoline 0.25 mg twice a week The dose was continued unchanged in 10/19 when her prolactin level had gone down to 3.8 but subsequently reduced to half tablet once a week when prolactin was still 4.0  She takes her cabergoline on Thursday No side effects from this  Her menstrual cycles have been regular every 28-30 days with normal flow  She does not complain of any milk discharge from her breasts No recurrence of headaches Prolactin pending  Prolactin levels as follows:  Lab Results  Component Value Date   PROLACTIN 4.0 (L) 06/08/2018   PROLACTIN 3.8 (L) 02/03/2018   PROLACTIN 85.5 (H) 01/04/2018   PROLACTIN 68.4 (H) 10/19/2017    MRI of pituitary gland done in 06/2017 shows 92mm benign adenoma without any other changes   Allergies as of 10/19/2018      Reactions   Banana Hives, Shortness Of Breath   Peanut-containing Drug Products Hives, Shortness Of Breath   All nuts (allergic) hives   Shellfish Allergy Hives, Shortness Of Breath   Chlorhexidine Hives      Medication List       Accurate  as of October 19, 2018 11:17 AM. If you have any questions, ask your nurse or doctor.        STOP taking these medications   doxycycline 100 MG capsule Commonly known as: VIBRAMYCIN Stopped by: Elayne Snare, MD   fexofenadine 180 MG tablet Commonly known as: ALLEGRA Stopped by: Elayne Snare, MD   predniSONE 20 MG tablet Commonly known as: DELTASONE Stopped by: Elayne Snare, MD   traMADol 50 MG tablet Commonly known as: ULTRAM Stopped by: Elayne Snare, MD     TAKE these medications   albuterol 108 (90 Base) MCG/ACT inhaler Commonly known as: VENTOLIN HFA 1 to 2 puffs (5 minutes apart) every 4 hour as needed for Asthma   azelastine 0.1 % nasal spray Commonly known as: ASTELIN Place 2 sprays into both nostrils 2 (two) times daily. Use in each nostril as directed   cabergoline 0.5 MG tablet Commonly known as: DOSTINEX TAKE 1/2 TABLET BY MOUTH ONCE A WEEK.   calcium carbonate 750 MG chewable tablet Commonly known as: TUMS EX Chew 2 tablets by mouth daily.   cetirizine 10 MG tablet Commonly known as: ZYRTEC TAKE 1 TABLET BY MOUTH EVERY DAY   cholecalciferol 1000 units tablet Commonly known as: VITAMIN D Take 1,000 Units by mouth daily.   famotidine 20 MG tablet Commonly known as: PEPCID Take 1 tablet (20 mg total) by mouth 2 (two) times  daily before a meal.   fluticasone 50 MCG/ACT nasal spray Commonly known as: FLONASE Place 1 spray into both nostrils daily for 3 days.   fluticasone furoate-vilanterol 200-25 MCG/INH Aepb Commonly known as: Breo Ellipta Inhale 1 puff into the lungs daily. Rinse mouth thoroughly after use to avoid thrush.   ibuprofen 800 MG tablet Commonly known as: ADVIL Take 1 tablet (800 mg total) by mouth 3 (three) times daily.   meloxicam 7.5 MG tablet Commonly known as: Mobic Take 2 tablets (15 mg total) by mouth daily as needed for pain.   multivitamin tablet Take 1 tablet by mouth daily.       Allergies:  Allergies  Allergen Reactions   . Banana Hives and Shortness Of Breath  . Peanut-Containing Drug Products Hives and Shortness Of Breath    All nuts (allergic) hives   . Shellfish Allergy Hives and Shortness Of Breath  . Chlorhexidine Hives    Past Medical History:  Diagnosis Date  . Asthma   . Bloody discharge from left nipple 07/2017  . Irregular menstrual cycle   . Left breast mass 07/2017  . Pituitary adenoma [facility])     Past Surgical History:  Procedure Laterality Date  . BREAST DUCTAL SYSTEM EXCISION Left 07/26/2017   Procedure: EXCISION DUCTAL SYSTEM BREAST;  Surgeon: Rolm Bookbinder, MD;  Location: [place];  Service: General;  Laterality: Left;  . BREAST EXCISIONAL BIOPSY Left 07/26/2017   benign papilloma  . RADIOACTIVE SEED GUIDED EXCISIONAL BREAST BIOPSY Left 07/26/2017   Procedure: LEFT RADIOACTIVE SEED GUIDED EXCISIONAL BREAST BIOPSY ERAS PATHWAY;  Surgeon: Rolm Bookbinder, MD;  Location: [place];  Service: General;  Laterality: Left;  . RADIOACTIVE SEED GUIDED EXCISIONAL BREAST BIOPSY Right 06/16/2018   Procedure: RADIOACTIVE SEED GUIDED EXCISIONAL RIGHT BREAST BIOPSY;  Surgeon: Rolm Bookbinder, MD;  Location: [place];  Service: General;  Laterality: Right;  . right breast biopsy Right 2019   With axillary lymph node biopsy  . TONSILLECTOMY      Family History  Problem Relation Age of Onset  . Breast cancer Paternal Grandmother   . Diabetes Paternal Grandmother   . Hypertension Mother   . Spina bifida Mother   . Drug abuse Mother   . Lung cancer Maternal Grandmother        smoker  . Rheum arthritis Maternal Grandmother   . Hypertension Maternal Grandfather   . Diabetes Maternal Grandfather     Social History:  reports that she has never smoked. She has never used smokeless tobacco. She reports that she does not drink alcohol or use drugs.   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 Any transcriptional errors that result from this process are unintentional.  Prolactin 2.4.  She will stop cabergoline Follow-up in 2 months with labs

## 2018-10-19 NOTE — Progress Notes (Signed)
Complete Physical  Assessment and Plan:  Diagnoses and all orders for this visit:  Encounter for routine adult health examination without abnormal findings She will follow up with GYN for pelvic/breasts   Intrinsic asthma Well controlled, avoid triggers, continue inhalers  Prolactin secreting Pituitary Adenoma (1997) Followed by Dr. Dwyane Dee  Multiple food allergies Avoid triggers, doing well on OTC agents  Intraductal papilloma S/p excisional biopsy, negative Continue annual mammograms Established with Dr. Donne Hazel to follow up if needed   HX of seasonal allergies Continue OTC allergy pills  Mixed hyperlipidemia Mild elevations currently treated by lifestyle modification only Continue low cholesterol diet and exercise.  Check lipid panel.   Morbid obesity Long discussion about weight loss, diet, and exercise Recommended diet heavy in fruits and veggies and low in animal meats, cheeses, and dairy products, appropriate calorie intake Discussed appropriate weight for height  Follow up at next visit  Orders Placed This Encounter  Procedures  . CBC with Differential/Platelet  . COMPLETE METABOLIC PANEL WITH GFR  . Magnesium  . Lipid panel  . TSH  . Hemoglobin A1c  . VITAMIN D 25 Hydroxy (Vit-D Deficiency, Fractures)  . Urinalysis, Routine w reflex microscopic  . EKG 12-Lead     Discussed med's effects and SE's. Screening labs and tests as requested with regular follow-up as recommended. Over 40 minutes of exam, counseling, chart review, and complex, high level critical decision making was performed this visit.   Future Appointments  Date Time Provider [place]  10/24/2019  2:00 PM Garnet Sierras, NP [facility] None     HPI  48 y.o. AA female, single, works as Microbiologist presents for a complete physical and follow up for has Intrinsic asthma; Multiple food allergies; Seasonal allergies; Prolactin secreting Pituitary Adenoma (1997);  Intraductal papilloma; Hyperlipidemia; Morbid obesity with BMI of 40.0-44.9, adult ([place]); and Vitamin D deficiency on their problem list.    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 Dwyane Dee for ongoing management and now treated by cabogoline.   She also has recurrent intraductal papilloma, most recently in 06/2017 noted 1.3cm intraductal mass at R breast 2'o clock, 1 cm from nipple for which she underwent excisional biopsy and was found to be benign, though with recurrent complications including mastitis and continues to follow with Gen surgeon Dr. Donne Hazel.   She has multiple allergies and asthma, zyrtec, flonase, Breo, albuterol and doing very well.  BMI is Body mass index is 40.34 kg/m., she has been working on diet and exercise (started last week). She has cut out soda, red meat, reducing sugar/carbs. Sleeps well at night.  Wt Readings from Last 3 Encounters:  10/20/18 235 lb (106.6 kg)  10/19/18 234 lb 6.4 oz (106.3 kg)  06/16/18 226 lb 6.6 oz (102.7 kg)   Her blood pressure has been controlled at home, today their BP is BP: 100/68 She does workout. She denies chest pain, shortness of breath, dizziness.   She is not on cholesterol medication and denies myalgias. Her cholesterol is not at goal. The cholesterol last visit was:   Lab Results  Component Value Date   CHOL 179 10/19/2017   HDL 56 10/19/2017   LDLCALC 104 (H) 10/19/2017   TRIG 97 10/19/2017   CHOLHDL 3.2 10/19/2017   Last A1C in the office was:  Lab Results  Component Value Date   HGBA1C 5.4 10/19/2017  Last GFR: Lab Results  Component Value Date   GFRAA 98 10/19/2017   Patient is on Vitamin D supplement, ? 5000 IU daily    Lab Results  Component Value Date   VD25OH 25 (L) 10/19/2017      Current Medications:  Current Outpatient Medications on File Prior to Visit  Medication Sig Dispense Refill  .  albuterol (VENTOLIN HFA) 108 (90 Base) MCG/ACT inhaler 1 to 2 puffs (5 minutes apart) every 4 hour as needed for Asthma 18 g 1  . azelastine (ASTELIN) 0.1 % nasal spray Place 2 sprays into both nostrils 2 (two) times daily. Use in each nostril as directed 30 mL 2  . cabergoline (DOSTINEX) 0.5 MG tablet TAKE 1/2 TABLET BY MOUTH ONCE A WEEK. 7 tablet 1  . calcium carbonate (TUMS EX) 750 MG chewable tablet Chew 2 tablets by mouth daily.    . cetirizine (ZYRTEC) 10 MG tablet TAKE 1 TABLET BY MOUTH EVERY DAY 90 tablet 1  . cholecalciferol (VITAMIN D) 1000 UNITS tablet Take 1,000 Units by mouth daily.    . famotidine (PEPCID) 20 MG tablet Take 1 tablet (20 mg total) by mouth 2 (two) times daily before a meal. 60 tablet 2  . fluticasone (FLONASE) 50 MCG/ACT nasal spray Place 1 spray into both nostrils daily for 3 days. 1 g 0  . fluticasone furoate-vilanterol (BREO ELLIPTA) 200-25 MCG/INH AEPB Inhale 1 puff into the lungs daily. Rinse mouth thoroughly after use to avoid thrush. 60 each 99  . ibuprofen (ADVIL,MOTRIN) 800 MG tablet Take 1 tablet (800 mg total) by mouth 3 (three) times daily. (Patient taking differently: 800 mg as needed. ) 21 tablet 0  . Multiple Vitamin (MULTIVITAMIN) tablet Take 1 tablet by mouth daily.    . meloxicam (MOBIC) 7.5 MG tablet Take 2 tablets (15 mg total) by mouth daily as needed for pain. (Patient not taking: Reported on 10/20/2018) 30 tablet 2   No current facility-administered medications on file prior to visit.    Allergies:  Allergies  Allergen Reactions  . Banana Hives and Shortness Of Breath  . Peanut-Containing Drug Products Hives and Shortness Of Breath    All nuts (allergic) hives   . Shellfish Allergy Hives and Shortness Of Breath  . Chlorhexidine Hives   Medical History:  She has Intrinsic asthma; Multiple food allergies; Seasonal allergies; Prolactin secreting Pituitary Adenoma (1997); Intraductal papilloma; Hyperlipidemia; Morbid obesity with BMI of  40.0-44.9, adult ([place]); and Vitamin D deficiency on their problem list. Health Maintenance:   Immunization History  Administered Date(s) Administered  . Tdap 10/19/2017    Tetanus: 2019 Pneumovax:-  Prevnar 13: - Flu vaccine: n/a  Zostavax: n/a   LMP: No LMP recorded.  Pap: Remote, never sexually active, will follow up with GYN MGM: 06/2017, R side 04/2018, will schedule DEXA: n/a  Colonoscopy: never, family history, colonoscopy at 20 EGD: -  Last Dental Exam: Dr. ?, last 2 years ago, will schedule  Last Eye Exam: 2019, wears glasses, will follow up  Patient Care Team: Unk Pinto, MD as PCP - General (Internal Medicine)  Surgical History:  She has a past surgical history that includes Tonsillectomy; Radioactive seed guided excisional breast biopsy (Left, 07/26/2017); Breast ductal system excision (Left, 07/26/2017); right breast biopsy (Right, 2019); Breast excisional biopsy (Left, 07/26/2017); and Radioactive seed guided excisional breast biopsy (Right, 06/16/2018). Family History:  Herfamily history includes Breast cancer in her paternal grandmother; Diabetes in her maternal grandfather and paternal grandmother; Drug abuse in her  mother; Hypertension in her maternal grandfather and mother; Lung cancer in her maternal grandmother; Myopathy in her father; Rheum arthritis in her maternal grandmother; Spina bifida in her mother. Social History:  She reports that she has never smoked. She has never used smokeless tobacco. She reports that she does not drink alcohol or use drugs.  Review of Systems: Review of Systems  Constitutional: Negative for malaise/fatigue and weight loss.  HENT: Negative for hearing loss and tinnitus.   Eyes: Negative for blurred vision and double vision.  Respiratory: Negative for cough, sputum production, shortness of breath and wheezing.   Cardiovascular: Negative for chest pain, palpitations, orthopnea, claudication, leg swelling and PND.   Gastrointestinal: Negative for abdominal pain, blood in stool, constipation, diarrhea, heartburn, melena, nausea and vomiting.  Genitourinary: Negative.   Musculoskeletal: Negative for falls, joint pain and myalgias.  Skin: Negative for rash.  Neurological: Negative for dizziness, tingling, sensory change, weakness and headaches.  Endo/Heme/Allergies: Negative for polydipsia.  Psychiatric/Behavioral: Negative.  Negative for depression, memory loss, substance abuse and suicidal ideas. The patient is not nervous/anxious and does not have insomnia.   All other systems reviewed and are negative.   Physical Exam: Estimated body mass index is 40.34 kg/m as calculated from the following:   Height as of this encounter: 5\' 4"  (1.626 m).   Weight as of this encounter: 235 lb (106.6 kg). BP 100/68   Pulse 79   Temp (!) 97.5 F (36.4 C)   Ht 5\' 4"  (1.626 m)   Wt 235 lb (106.6 kg)   SpO2 97%   BMI 40.34 kg/m  General Appearance: Well nourished, in no apparent distress.  Eyes: PERRLA, EOMs, conjunctiva no swelling or erythema, normal fundi and vessels.  Sinuses: No Frontal/maxillary tenderness  ENT/Mouth: Ext aud canals clear, normal light reflex with TMs without erythema, bulging. Good dentition. No erythema, swelling, or exudate on post pharynx. Tonsils not swollen or erythematous. Hearing normal.  Neck: Supple, thyroid normal. No bruits  Respiratory: Respiratory effort normal, BS equal bilaterally without rales, rhonchi, wheezing or stridor.  Cardio: RRR without murmurs, rubs or gallops. Brisk peripheral pulses without edema.  Chest: symmetric, with normal excursions and percussion.  Breasts: Defer to GYN, will schedule soon  Abdomen: Soft, nontender, no guarding, rebound, hernias, masses, or organomegaly.  Lymphatics: Non tender without lymphadenopathy.  Genitourinary: Defer to GYN, will schedule soon  Musculoskeletal: Full ROM all peripheral extremities,5/5 strength, and normal gait.   Skin: Warm, dry without rashes, lesions, ecchymosis. Neuro: Cranial nerves intact, reflexes equal bilaterally. Normal muscle tone, no cerebellar symptoms. Sensation intact.  Psych: Awake and oriented X 3, normal affect, Insight and Judgment appropriate.   EKG: WNL no ST changes.  Wanda Douglas 2:39 PM [facility]

## 2018-10-20 ENCOUNTER — Encounter: Payer: Self-pay | Admitting: Adult Health

## 2018-10-20 ENCOUNTER — Ambulatory Visit: Payer: BC Managed Care – PPO | Admitting: Adult Health

## 2018-10-20 ENCOUNTER — Other Ambulatory Visit: Payer: Self-pay

## 2018-10-20 VITALS — BP 100/68 | HR 79 | Temp 97.5°F | Ht 64.0 in | Wt 235.0 lb

## 2018-10-20 DIAGNOSIS — Z131 Encounter for screening for diabetes mellitus: Secondary | ICD-10-CM | POA: Diagnosis not present

## 2018-10-20 DIAGNOSIS — I10 Essential (primary) hypertension: Secondary | ICD-10-CM

## 2018-10-20 DIAGNOSIS — Z1389 Encounter for screening for other disorder: Secondary | ICD-10-CM | POA: Diagnosis not present

## 2018-10-20 DIAGNOSIS — Z136 Encounter for screening for cardiovascular disorders: Secondary | ICD-10-CM | POA: Diagnosis not present

## 2018-10-20 DIAGNOSIS — Z0000 Encounter for general adult medical examination without abnormal findings: Secondary | ICD-10-CM

## 2018-10-20 DIAGNOSIS — Z91018 Allergy to other foods: Secondary | ICD-10-CM

## 2018-10-20 DIAGNOSIS — Z0001 Encounter for general adult medical examination with abnormal findings: Secondary | ICD-10-CM

## 2018-10-20 DIAGNOSIS — E559 Vitamin D deficiency, unspecified: Secondary | ICD-10-CM | POA: Diagnosis not present

## 2018-10-20 DIAGNOSIS — Z1329 Encounter for screening for other suspected endocrine disorder: Secondary | ICD-10-CM

## 2018-10-20 DIAGNOSIS — Z79899 Other long term (current) drug therapy: Secondary | ICD-10-CM | POA: Diagnosis not present

## 2018-10-20 DIAGNOSIS — Z13220 Encounter for screening for lipoid disorders: Secondary | ICD-10-CM | POA: Diagnosis not present

## 2018-10-20 DIAGNOSIS — J302 Other seasonal allergic rhinitis: Secondary | ICD-10-CM

## 2018-10-20 DIAGNOSIS — Z8249 Family history of ischemic heart disease and other diseases of the circulatory system: Secondary | ICD-10-CM | POA: Diagnosis not present

## 2018-10-20 DIAGNOSIS — J45909 Unspecified asthma, uncomplicated: Secondary | ICD-10-CM

## 2018-10-20 DIAGNOSIS — D352 Benign neoplasm of pituitary gland: Secondary | ICD-10-CM

## 2018-10-20 DIAGNOSIS — R7309 Other abnormal glucose: Secondary | ICD-10-CM

## 2018-10-20 DIAGNOSIS — D369 Benign neoplasm, unspecified site: Secondary | ICD-10-CM

## 2018-10-20 DIAGNOSIS — E782 Mixed hyperlipidemia: Secondary | ICD-10-CM

## 2018-10-20 LAB — PROLACTIN: Prolactin: 2.4 ng/mL — ABNORMAL LOW (ref 4.8–23.3)

## 2018-10-20 NOTE — Patient Instructions (Addendum)
  Wanda Douglas , Thank you for taking time to come for your [facility] Visit. I appreciate your ongoing commitment to your health goals. Please review the following plan we discussed and let me know if I can assist you in the future.   These are the goals we discussed: Goals    . Exercise 150 min/wk Moderate Activity    . Weight (lb) < 200 lb (90.7 kg)     Weigh weekly and keep a log for progress monitoring       This is a list of the screening recommended for you and due dates:  Health Maintenance  Topic Date Due  . Pap Smear  03/15/1992  . HIV Screening  10/20/2018*  . Flu Shot  12/03/2018  . Tetanus Vaccine  10/20/2027  *Topic was postponed. The date shown is not the original due date.     Schedule follow up with GYN for PAP smear     Aim to achieve and maintain a health weight   Aim for 7+ servings of fruits and vegetables daily  65-80+ fluid ounces of water or unsweet tea for healthy kidneys  Limit to max 1 drink of alcohol per day; avoid smoking/tobacco  Limit animal fats in diet for cholesterol and heart health - choose grass fed whenever available  Avoid highly processed foods, and foods high in saturated/trans fats  Aim for low stress - take time to unwind and care for your mental health  Aim for 150 min of moderate intensity exercise weekly for heart health, and weights twice weekly for bone health  Aim for 7-9 hours of sleep daily      When it comes to diets, agreement about the perfect plan isn't easy to find, even among the experts. Experts at the [employer] developed an idea known as the Healthy Eating Plate. Just imagine a plate divided into logical, healthy portions.  The emphasis is on diet quality:  Load up on vegetables and fruits - one-half of your plate: Aim for color and variety, and remember that potatoes don't count.  Go for whole grains - one-quarter of your plate: Whole wheat, barley, wheat berries, quinoa, oats,  brown rice, and foods made with them. If you want pasta, go with whole wheat pasta.  Protein power - one-quarter of your plate: Fish, chicken, beans, and nuts are all healthy, versatile protein sources. Limit red meat.  The diet, however, does go beyond the plate, offering a few other suggestions.  Use healthy plant oils, such as olive, canola, soy, corn, sunflower and peanut. Check the labels, and avoid partially hydrogenated oil, which have unhealthy trans fats.  If you're thirsty, drink water. Coffee and tea are good in moderation, but skip sugary drinks and limit milk and dairy products to one or two daily servings.  The type of carbohydrate in the diet is more important than the amount. Some sources of carbohydrates, such as vegetables, fruits, whole grains, and beans-are healthier than others.  Finally, stay active.

## 2018-10-20 NOTE — Addendum Note (Signed)
Addended by: Elayne Snare on: 10/20/2018 09:57 AM   Modules accepted: Orders

## 2018-10-21 ENCOUNTER — Other Ambulatory Visit: Payer: Self-pay | Admitting: Adult Health

## 2018-10-21 DIAGNOSIS — E0590 Thyrotoxicosis, unspecified without thyrotoxic crisis or storm: Secondary | ICD-10-CM

## 2018-10-21 LAB — CBC WITH DIFFERENTIAL/PLATELET
Absolute Monocytes: 504 cells/uL (ref 200–950)
Basophils Absolute: 28 cells/uL (ref 0–200)
Basophils Relative: 0.4 %
Eosinophils Absolute: 200 cells/uL (ref 15–500)
Eosinophils Relative: 2.9 %
HCT: 39.9 % (ref 35.0–45.0)
Hemoglobin: 12.9 g/dL (ref 11.7–15.5)
Lymphs Abs: 2870 cells/uL (ref 850–3900)
MCH: 27.5 pg (ref 27.0–33.0)
MCHC: 32.3 g/dL (ref 32.0–36.0)
MCV: 85.1 fL (ref 80.0–100.0)
MPV: 11 fL (ref 7.5–12.5)
Monocytes Relative: 7.3 %
Neutro Abs: 3298 cells/uL (ref 1500–7800)
Neutrophils Relative %: 47.8 %
Platelets: 286 10*3/uL (ref 140–400)
RBC: 4.69 10*6/uL (ref 3.80–5.10)
RDW: 13.7 % (ref 11.0–15.0)
Total Lymphocyte: 41.6 %
WBC: 6.9 10*3/uL (ref 3.8–10.8)

## 2018-10-21 LAB — COMPLETE METABOLIC PANEL WITH GFR
AG Ratio: 1.2 (calc) (ref 1.0–2.5)
ALT: 12 U/L (ref 6–29)
AST: 14 U/L (ref 10–35)
Albumin: 3.7 g/dL (ref 3.6–5.1)
Alkaline phosphatase (APISO): 77 U/L (ref 31–125)
BUN: 18 mg/dL (ref 7–25)
CO2: 28 mmol/L (ref 20–32)
Calcium: 8.9 mg/dL (ref 8.6–10.2)
Chloride: 105 mmol/L (ref 98–110)
Creat: 0.85 mg/dL (ref 0.50–1.10)
GFR, Est African American: 95 mL/min/{1.73_m2} (ref >=60)
GFR, Est Non African American: 82 mL/min/{1.73_m2} (ref >=60)
Globulin: 3.1 g/dL (calc) (ref 1.9–3.7)
Glucose, Bld: 81 mg/dL (ref 65–99)
Potassium: 4.1 mmol/L (ref 3.5–5.3)
Sodium: 138 mmol/L (ref 135–146)
Total Bilirubin: 0.3 mg/dL (ref 0.2–1.2)
Total Protein: 6.8 g/dL (ref 6.1–8.1)

## 2018-10-21 LAB — URINALYSIS, ROUTINE W REFLEX MICROSCOPIC
Bilirubin Urine: NEGATIVE
Glucose, UA: NEGATIVE
Hgb urine dipstick: NEGATIVE
Ketones, ur: NEGATIVE
Leukocytes,Ua: NEGATIVE
Nitrite: NEGATIVE
Protein, ur: NEGATIVE
Specific Gravity, Urine: 1.022 (ref 1.001–1.03)
pH: 6 (ref 5.0–8.0)

## 2018-10-21 LAB — VITAMIN D 25 HYDROXY (VIT D DEFICIENCY, FRACTURES): Vit D, 25-Hydroxy: 54 ng/mL (ref 30–100)

## 2018-10-21 LAB — LIPID PANEL
Cholesterol: 188 mg/dL (ref <200)
HDL: 60 mg/dL (ref >=50)
LDL Cholesterol (Calc): 114 mg/dL (calc) — ABNORMAL HIGH
Non-HDL Cholesterol (Calc): 128 mg/dL (calc) (ref <130)
Total CHOL/HDL Ratio: 3.1 (calc) (ref <5.0)
Triglycerides: 58 mg/dL (ref <150)

## 2018-10-21 LAB — MAGNESIUM: Magnesium: 1.8 mg/dL (ref 1.5–2.5)

## 2018-10-21 LAB — HEMOGLOBIN A1C
Hgb A1c MFr Bld: 5.4 % of total Hgb (ref <5.7)
Mean Plasma Glucose: 108 (calc)
eAG (mmol/L): 6 (calc)

## 2018-10-21 LAB — TSH: TSH: 0.31 mIU/L — ABNORMAL LOW

## 2018-11-28 ENCOUNTER — Other Ambulatory Visit: Payer: BC Managed Care – PPO

## 2018-11-28 ENCOUNTER — Other Ambulatory Visit: Payer: Self-pay

## 2018-11-28 DIAGNOSIS — E0590 Thyrotoxicosis, unspecified without thyrotoxic crisis or storm: Secondary | ICD-10-CM

## 2018-11-28 DIAGNOSIS — R5383 Other fatigue: Secondary | ICD-10-CM | POA: Diagnosis not present

## 2018-11-29 LAB — TSH: TSH: 0.5 mIU/L

## 2018-12-12 ENCOUNTER — Other Ambulatory Visit: Payer: BC Managed Care – PPO

## 2018-12-14 ENCOUNTER — Ambulatory Visit: Payer: BC Managed Care – PPO | Admitting: Endocrinology

## 2018-12-16 ENCOUNTER — Ambulatory Visit: Payer: BC Managed Care – PPO | Admitting: Endocrinology

## 2018-12-18 ENCOUNTER — Other Ambulatory Visit: Payer: Self-pay | Admitting: Adult Health

## 2019-01-03 ENCOUNTER — Other Ambulatory Visit: Payer: Self-pay | Admitting: Adult Health

## 2019-02-24 IMAGING — MG MM PLC BREAST LOC DEV 1ST LESION INC*R*
8 series · 8 of 8 positions shown · non-contrast
Comparison: Previous exam(s).

CLINICAL DATA: Localization of a biopsy clip prior to surgery

EXAM:
MAMMOGRAPHIC GUIDED RADIOACTIVE SEED LOCALIZATION OF THE RIGHT
BREAST

[R CC (1 of 3)]
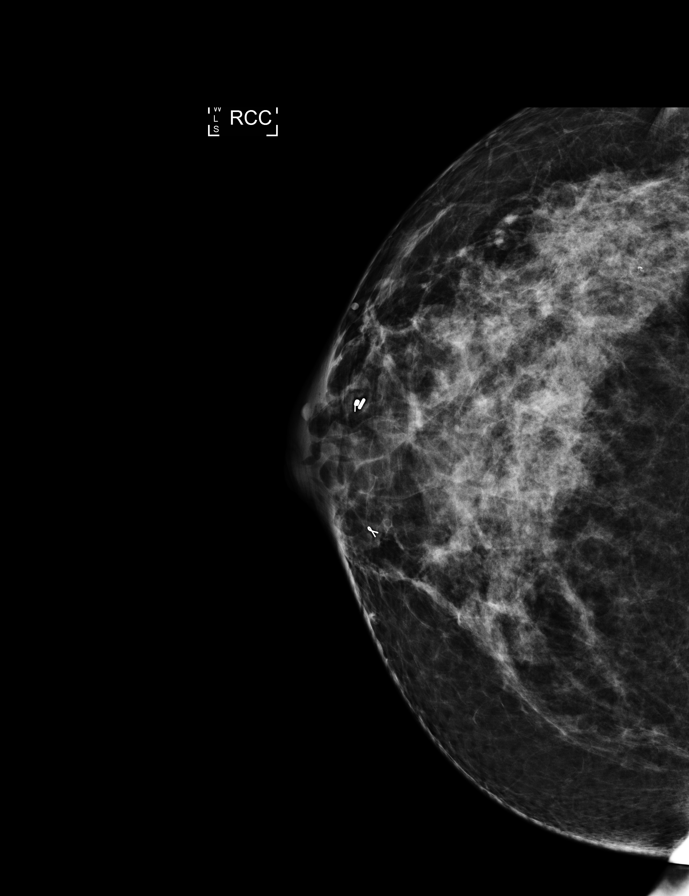

[R CC (2 of 3)]
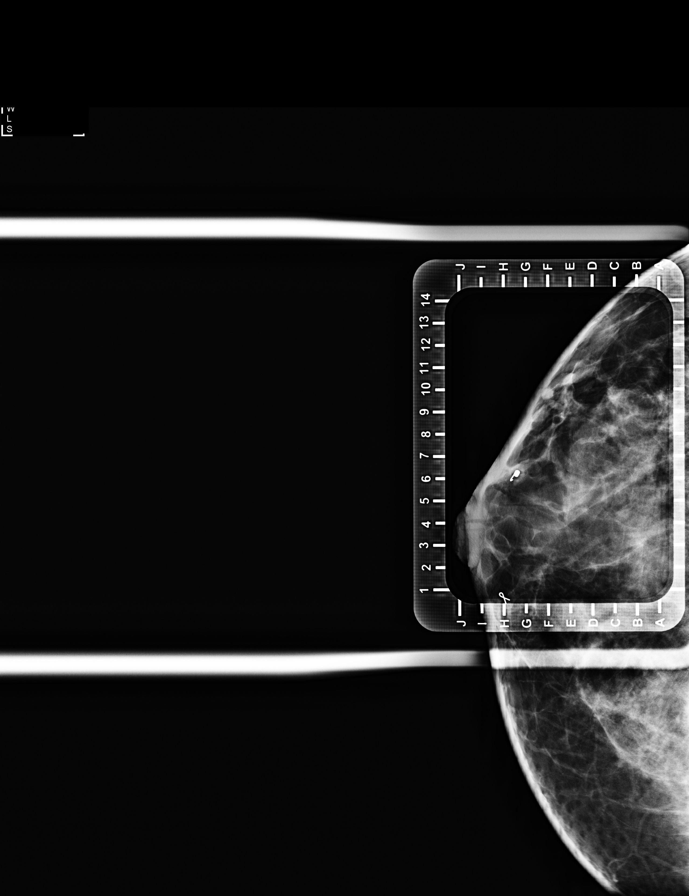

[R ML (1 of 5)]
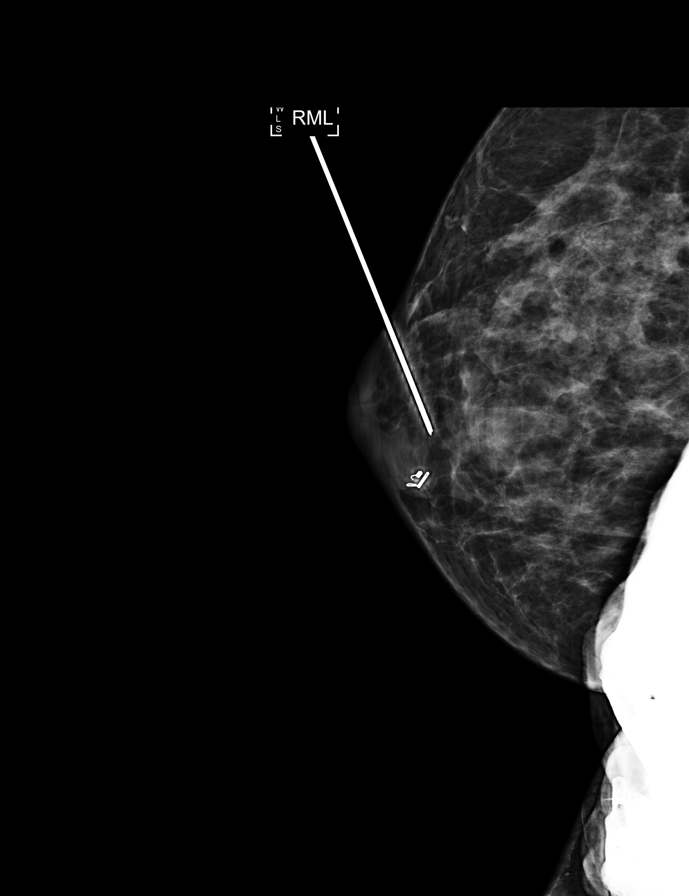

[R ML (2 of 5)]
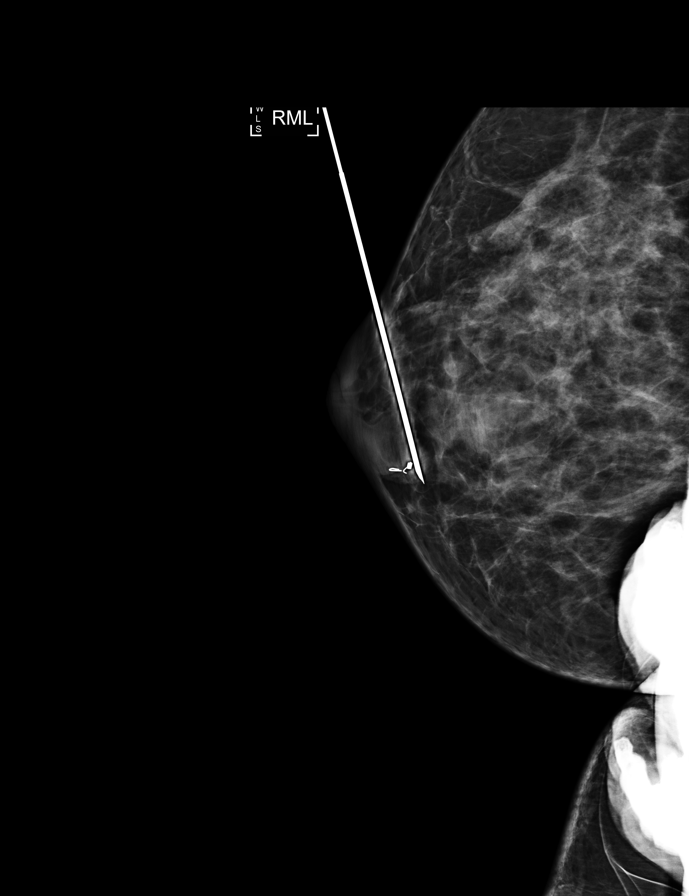

[R CC (3 of 3)]
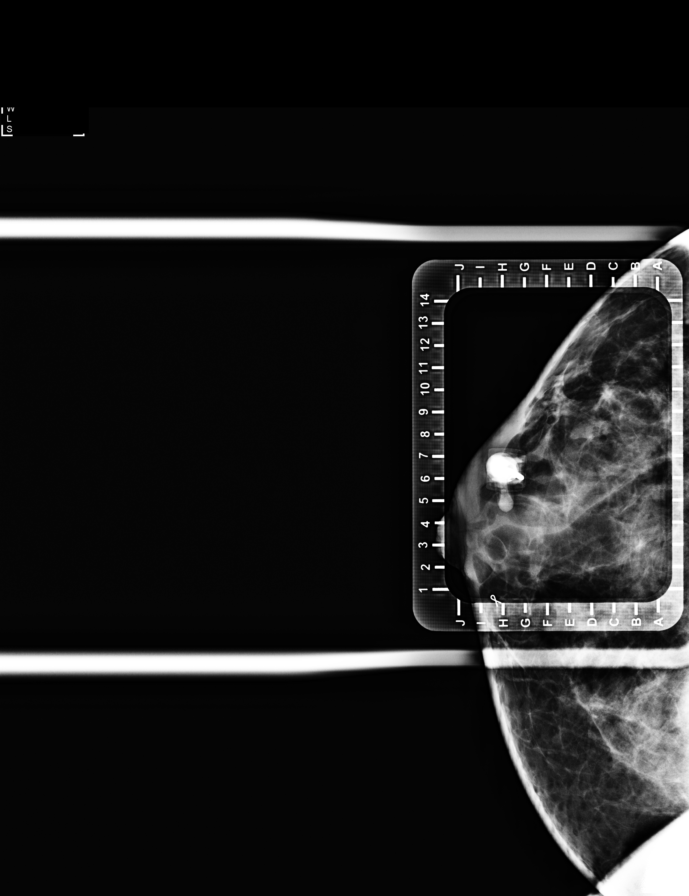

[R ML (3 of 5)]
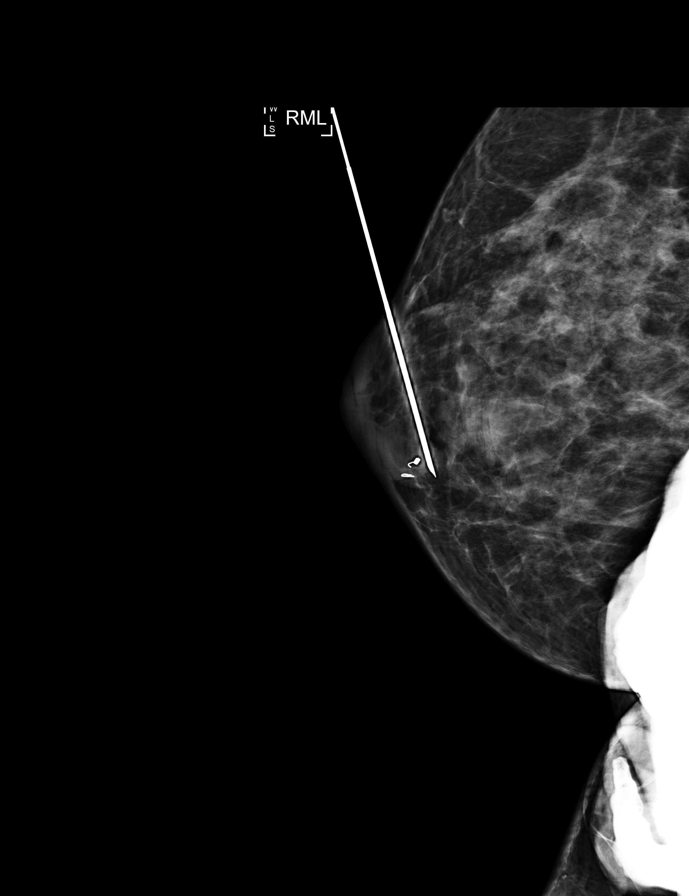

[R ML (4 of 5)]
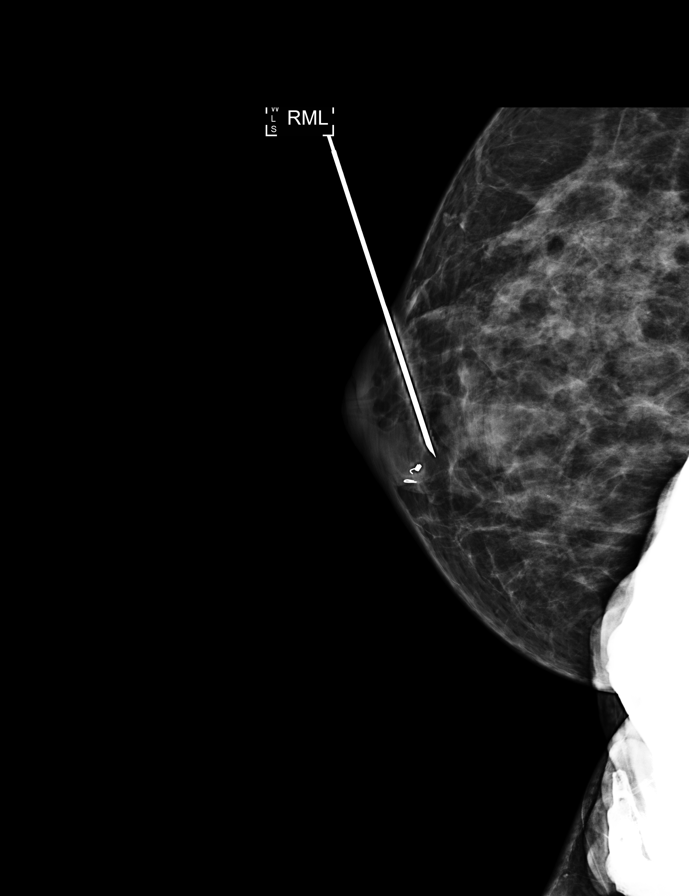

[R ML (5 of 5)]
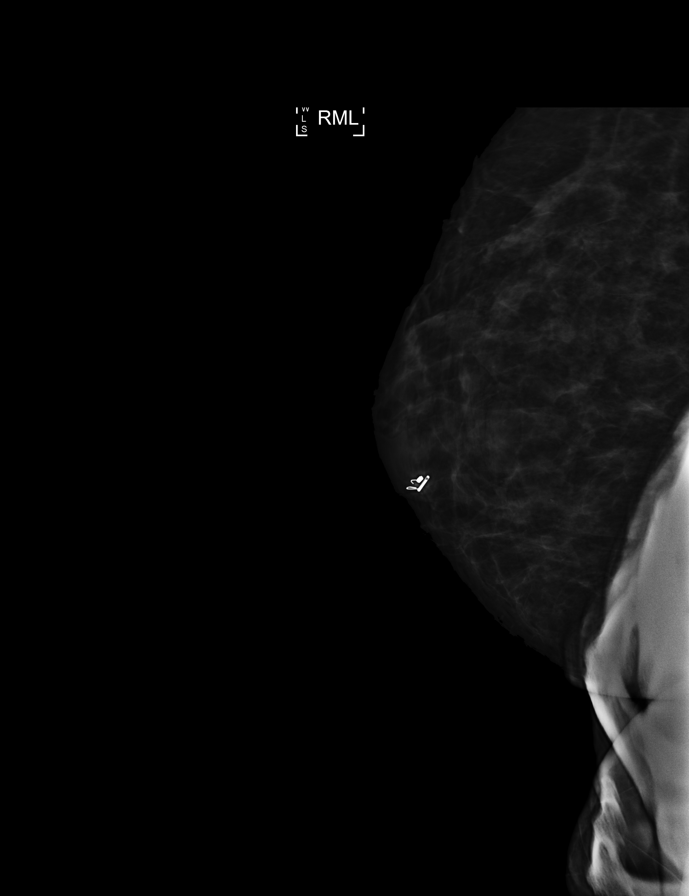

[8 of 8 positions shown; findings below may reference images not displayed]



The usual time-out protocol was performed immediately prior to the
procedure.

Using mammographic guidance, sterile technique, 1% lidocaine and an
W-1SQ radioactive seed, the coil shaped biopsy clip was localized
using a superior approach. The follow-up mammogram images confirm
the seed in the expected location and were marked for the surgeon.

Follow-up survey of the patient confirms presence of the radioactive
seed.

Order number of W-1SQ seed:  [id number].

Total activity:  0.246 millicuries reference Date: May 24, 2018

The patient tolerated the procedure well and was released from the
[REDACTED]. She was given instructions regarding seed removal.
IMPRESSION: Radioactive seed localization right breast. No apparent
complications.

## 2019-04-24 ENCOUNTER — Ambulatory Visit: Payer: BC Managed Care – PPO | Admitting: Adult Health

## 2019-05-22 NOTE — Progress Notes (Signed)
FOLLOW UP  Assessment and Plan:   Intrinsic asthma Well controlled, avoid triggers, continue inhalers PRN  Prolactin secreting Pituitary Adenoma (1997) Now followed by endocrinology Did well with cabergoline and has been d/c'd by neurology Check prolactin levels today after discussion with patient  Intraductal papilloma S/p multiple excisional biopsy, negative Continue annual mammograms Established with Dr. Donne Hazel if needed   Mixed hyperlipidemia Mild elevations currently treated by lifestyle modification only Continue low cholesterol diet and exercise.  Borderline, check lipids  Morbid obesity Long discussion about weight loss, diet, and exercise Commended her on successful ongoing weight loss Recommended diet heavy in fruits and veggies and low in animal meats, cheeses, and dairy products, appropriate calorie intake Continue meal prep, suggested she get a fitbit [place] [address] steps, continue to push water intake Discussed appropriate weight for height, discussed initial goal (<200lb)  Follow up at next visit in 6 months  Vitamin D Def Essentially at goal at recent check; continue to recommend supplementation for goal of 60-100 Defer vitamin D level to CPE   Continue diet and meds as discussed. Further disposition pending results of labs. Discussed med's effects and SE's.   Over 30 minutes of exam, counseling, chart review, and critical decision making was performed.   Future Appointments  Date Time Provider [place]  10/24/2019  2:00 PM Garnet Sierras, NP [facility] None    ----------------------------------------------------------------------------------------------------------------------  HPI 49 y.o. female  presents for 6 month follow up on cholesterol, weight and vitamin D deficiency.   She is high Microbiologist, stressful trying to teach online, starting hybrid.   Recent follow up MRI for pituitary adenoma on 06/2017 showed Stable  3 x 7 mm RIGHT floor of sella, she has since been referred to endocrinology Dr. Dwyane Dee for ongoing management and now treated by cabergoline with good response and was discontinued and reports continues to do well.   Component     Latest Ref Rng & Units 01/04/2018 02/03/2018 06/08/2018 10/19/2018  Prolactin     4.8 - 23.3 ng/mL 85.5 (H) 3.8 (L) 4.0 (L) 2.4 (L)   She also has recurrent intraductal papilloma, recently in 07/2017 and again in 06/2018 she underwent excisional biopsy and was found to be benign continues to follow with Gen surgeon Dr. Donne Hazel and getting annual mammograms.  Does endorse some bilateral fullness and tenderness without nipple discharge, heat, redness.   She has multiple allergies and mild intermittent asthma, zyrtec, flonase, off of breo, just albuterol PRN and continues to do well with this, arely needing except during the fall.    BMI is Body mass index is 42.23 kg/m., she admits has been stress eating; trying to work on diet, had stopped walking but restarted walking in the mall 30 min twice weekly. Has restarted meal prep, no meat, high fruits/veggies, protein shake smoothie, beans, does eat fish a few days a week. Pushing water intake. Sleeping well.  Wt Readings from Last 3 Encounters:  05/24/19 246 lb (111.6 kg)  10/20/18 235 lb (106.6 kg)  10/19/18 234 lb 6.4 oz (106.3 kg)   Today their BP is BP: 122/78  She does workout. She denies chest pain, shortness of breath, dizziness.   She is not on cholesterol medication. Her cholesterol is not at goal. The cholesterol last visit was:   Lab Results  Component Value Date   CHOL 188 10/20/2018   HDL 60 10/20/2018   LDLCALC 114 (H) 10/20/2018   TRIG 58 10/20/2018   CHOLHDL 3.1 10/20/2018  Last A1C in the office was:  Lab Results  Component Value Date   HGBA1C 5.4 10/20/2018   Patient is on vitamin D supplement.   Lab Results  Component Value Date   VD25OH 54 10/20/2018        Current Medications:  Current  Outpatient Medications on File Prior to Visit  Medication Sig  . albuterol (VENTOLIN HFA) 108 (90 Base) MCG/ACT inhaler 1 to 2 puffs (5 minutes apart) every 4 hour as needed for Asthma  . Azelastine HCl 137 MCG/SPRAY SOLN PLACE 2 SPRAYS INTO BOTH NOSTRILS 2 (TWO) TIMES DAILY. USE IN EACH NOSTRIL AS DIRECTED  . cabergoline (DOSTINEX) 0.5 MG tablet TAKE 1/2 TABLET BY MOUTH ONCE A WEEK.  . calcium carbonate (TUMS EX) 750 MG chewable tablet Chew 2 tablets by mouth daily.  . cetirizine (ZYRTEC) 10 MG tablet TAKE 1 TABLET BY MOUTH EVERY DAY  . cholecalciferol (VITAMIN D) 1000 UNITS tablet Take 1,000 Units by mouth daily.  . famotidine (PEPCID) 20 MG tablet TAKE 1 TABLET (20 MG TOTAL) BY MOUTH 2 (TWO) TIMES DAILY BEFORE A MEAL.  . fexofenadine (ALLEGRA) 180 MG tablet TAKE 1 TABLET BY MOUTH EVERY DAY  . fluticasone (FLONASE) 50 MCG/ACT nasal spray Place 1 spray into both nostrils daily for 3 days.  . fluticasone furoate-vilanterol (BREO ELLIPTA) 200-25 MCG/INH AEPB Inhale 1 puff into the lungs daily. Rinse mouth thoroughly after use to avoid thrush.  . ibuprofen (ADVIL,MOTRIN) 800 MG tablet Take 1 tablet (800 mg total) by mouth 3 (three) times daily. (Patient taking differently: 800 mg as needed. )  . meloxicam (MOBIC) 7.5 MG tablet Take 2 tablets (15 mg total) by mouth daily as needed for pain. (Patient not taking: Reported on 10/20/2018)  . Multiple Vitamin (MULTIVITAMIN) tablet Take 1 tablet by mouth daily.   No current facility-administered medications on file prior to visit.     Allergies:  Allergies  Allergen Reactions  . Banana Hives and Shortness Of Breath  . Peanut-Containing Drug Products Hives and Shortness Of Breath    All nuts (allergic) hives   . Shellfish Allergy Hives and Shortness Of Breath  . Chlorhexidine Hives     Medical History:  Past Medical History:  Diagnosis Date  . Asthma   . Bloody discharge from left nipple 07/2017  . Irregular menstrual cycle   . Left breast  mass 07/2017  . Pituitary adenoma ([place])    Family history- Reviewed and unchanged Social history- Reviewed and unchanged   Review of Systems:  Review of Systems  Constitutional: Negative for malaise/fatigue and weight loss.  HENT: Negative for hearing loss and tinnitus.   Eyes: Negative for blurred vision and double vision.  Respiratory: Negative for cough, shortness of breath and wheezing.   Cardiovascular: Negative for chest pain, palpitations, orthopnea, claudication and leg swelling.  Gastrointestinal: Negative for abdominal pain, blood in stool, constipation, diarrhea, heartburn, melena, nausea and vomiting.  Genitourinary: Negative.   Musculoskeletal: Negative for joint pain and myalgias.  Skin: Negative for rash.  Neurological: Negative for dizziness, tingling, sensory change, weakness and headaches.  Endo/Heme/Allergies: Negative for polydipsia.  Psychiatric/Behavioral: Negative.   All other systems reviewed and are negative.   Physical Exam: BP 122/78   Pulse 89   Temp 97.9 F (36.6 C)   Wt 246 lb (111.6 kg)   SpO2 97%   BMI 42.23 kg/m  Wt Readings from Last 3 Encounters:  05/24/19 246 lb (111.6 kg)  10/20/18 235 lb (106.6 kg)  10/19/18 234  lb 6.4 oz (106.3 kg)   General Appearance: Well nourished, in no apparent distress. Eyes: PERRLA, EOMs, conjunctiva no swelling or erythema Sinuses: No Frontal/maxillary tenderness ENT/Mouth: Ext aud canals clear, TMs without erythema, bulging. No erythema, swelling, or exudate on post pharynx.  Tonsils not swollen or erythematous. Hearing normal.  Neck: Supple, thyroid normal.  Respiratory: Respiratory effort normal, BS equal bilaterally without rales, rhonchi, wheezing or stridor.  Cardio: RRR with no MRGs. Brisk peripheral pulses without edema.  Abdomen: Soft, + BS.  Non tender, no guarding, rebound, hernias, masses. Lymphatics: Non tender without lymphadenopathy.  Musculoskeletal: Full ROM, 5/5 strength, Normal  gait Skin: Warm, dry without rashes, lesions, ecchymosis.  Neuro: Cranial nerves intact. No cerebellar symptoms.  Psych: Awake and oriented X 3, normal affect, Insight and Judgment appropriate.   Breasts: symmetric fibrous changes in both upper outer quadrants, surgical scars noted R subareolar area, no palpable lymph nodes, scant clear discharge L nipple. Non-erythematous, not tender.     Izora Ribas, NP 2:52 PM [facility]

## 2019-05-24 ENCOUNTER — Ambulatory Visit: Payer: BLUE CROSS/BLUE SHIELD | Admitting: Adult Health

## 2019-05-24 ENCOUNTER — Encounter: Payer: Self-pay | Admitting: Adult Health

## 2019-05-24 ENCOUNTER — Other Ambulatory Visit: Payer: Self-pay

## 2019-05-24 VITALS — BP 122/78 | HR 89 | Temp 97.9°F | Wt 246.0 lb

## 2019-05-24 DIAGNOSIS — J45909 Unspecified asthma, uncomplicated: Secondary | ICD-10-CM | POA: Diagnosis not present

## 2019-05-24 DIAGNOSIS — E782 Mixed hyperlipidemia: Secondary | ICD-10-CM | POA: Diagnosis not present

## 2019-05-24 DIAGNOSIS — D352 Benign neoplasm of pituitary gland: Secondary | ICD-10-CM

## 2019-05-24 DIAGNOSIS — Z6841 Body Mass Index (BMI) 40.0 and over, adult: Secondary | ICD-10-CM

## 2019-05-24 DIAGNOSIS — D369 Benign neoplasm, unspecified site: Secondary | ICD-10-CM

## 2019-05-24 DIAGNOSIS — E559 Vitamin D deficiency, unspecified: Secondary | ICD-10-CM | POA: Diagnosis not present

## 2019-05-24 DIAGNOSIS — Z79899 Other long term (current) drug therapy: Secondary | ICD-10-CM

## 2019-05-24 MED ORDER — CETIRIZINE HCL 10 MG PO TABS: 10.0000 mg | ORAL_TABLET | Freq: Every day | ORAL | 1 refills | Status: DC

## 2019-05-24 NOTE — Patient Instructions (Addendum)
Goals    . Exercise 150 min/wk Moderate Activity    . LDL CALC < 100    . Weight (lb) < 215 lb (97.5 kg)     Weigh weekly and keep a log for progress monitoring        Look into [employer] app - uses psychology to help you identify barriers and develop long term healthy habits - many patients have done very well with this     Drink 1/2 your body weight in fluid ounces of water daily; drink a tall glass of water 30 min before meals  Don't eat until you're stuffed- listen to your stomach and eat until you are 80% full   Try eating off of a salad plate; wait 10 min after finishing before going back for seconds  Start by eating the vegetables on your plate; aim for 50% of your meals to be fruits or vegetables  Then eat your protein - lean meats (grass fed if possible), fish, beans, nuts in moderation  Eat your carbs/starch last ONLY if you still are hungry. If you can, stop before finishing it all  Avoid sugar and flour - the closer it looks to it's original form in nature, typically the better it is for you  Splurge in moderation - "assign" days when you get to splurge and have the "bad stuff" - I like to follow a 80% - 20% plan- "good" choices 80 % of the time, "bad" choices in moderation 20% of the time  Simple equation is: Calories out > calories in = weight loss - even if you eat the bad stuff, if you limit portions, you will still lose weight        When it comes to diets, agreement about the perfect plan isn't easy to find, even among the experts. Experts at the [employer] developed an idea known as the Healthy Eating Plate. Just imagine a plate divided into logical, healthy portions.  The emphasis is on diet quality:  Load up on vegetables and fruits - one-half of your plate: Aim for color and variety, and remember that potatoes don't count.  Go for whole grains - one-quarter of your plate: Whole wheat, barley, wheat berries, quinoa, oats, brown rice,  and foods made with them. If you want pasta, go with whole wheat pasta.  Protein power - one-quarter of your plate: Fish, chicken, beans, and nuts are all healthy, versatile protein sources. Limit red meat.  The diet, however, does go beyond the plate, offering a few other suggestions.  Use healthy plant oils, such as olive, canola, soy, corn, sunflower and peanut. Check the labels, and avoid partially hydrogenated oil, which have unhealthy trans fats.  If you're thirsty, drink water. Coffee and tea are good in moderation, but skip sugary drinks and limit milk and dairy products to one or two daily servings.  The type of carbohydrate in the diet is more important than the amount. Some sources of carbohydrates, such as vegetables, fruits, whole grains, and beans--are healthier than others.  Finally, stay active.

## 2019-05-25 LAB — TSH: TSH: 0.77 mIU/L

## 2019-05-25 LAB — COMPLETE METABOLIC PANEL WITH GFR
AG Ratio: 1.3 (calc) (ref 1.0–2.5)
ALT: 15 U/L (ref 6–29)
AST: 15 U/L (ref 10–35)
Albumin: 3.7 g/dL (ref 3.6–5.1)
Alkaline phosphatase (APISO): 73 U/L (ref 31–125)
BUN: 11 mg/dL (ref 7–25)
CO2: 29 mmol/L (ref 20–32)
Calcium: 9 mg/dL (ref 8.6–10.2)
Chloride: 105 mmol/L (ref 98–110)
Creat: 0.83 mg/dL (ref 0.50–1.10)
GFR, Est African American: 97 mL/min/{1.73_m2} (ref >=60)
GFR, Est Non African American: 83 mL/min/{1.73_m2} (ref >=60)
Globulin: 2.9 g/dL (calc) (ref 1.9–3.7)
Glucose, Bld: 87 mg/dL (ref 65–99)
Potassium: 4 mmol/L (ref 3.5–5.3)
Sodium: 139 mmol/L (ref 135–146)
Total Bilirubin: 0.2 mg/dL (ref 0.2–1.2)
Total Protein: 6.6 g/dL (ref 6.1–8.1)

## 2019-05-25 LAB — CBC WITH DIFFERENTIAL/PLATELET
Absolute Monocytes: 774 cells/uL (ref 200–950)
Basophils Absolute: 49 cells/uL (ref 0–200)
Basophils Relative: 0.5 %
Eosinophils Absolute: 186 cells/uL (ref 15–500)
Eosinophils Relative: 1.9 %
HCT: 38 % (ref 35.0–45.0)
Hemoglobin: 12.3 g/dL (ref 11.7–15.5)
Lymphs Abs: 3802 cells/uL (ref 850–3900)
MCH: 27 pg (ref 27.0–33.0)
MCHC: 32.4 g/dL (ref 32.0–36.0)
MCV: 83.5 fL (ref 80.0–100.0)
MPV: 11.2 fL (ref 7.5–12.5)
Monocytes Relative: 7.9 %
Neutro Abs: 4988 cells/uL (ref 1500–7800)
Neutrophils Relative %: 50.9 %
Platelets: 244 10*3/uL (ref 140–400)
RBC: 4.55 10*6/uL (ref 3.80–5.10)
RDW: 13.2 % (ref 11.0–15.0)
Total Lymphocyte: 38.8 %
WBC: 9.8 10*3/uL (ref 3.8–10.8)

## 2019-05-25 LAB — LIPID PANEL
Cholesterol: 191 mg/dL (ref <200)
HDL: 56 mg/dL (ref >=50)
LDL Cholesterol (Calc): 109 mg/dL (calc) — ABNORMAL HIGH
Non-HDL Cholesterol (Calc): 135 mg/dL (calc) — ABNORMAL HIGH (ref <130)
Total CHOL/HDL Ratio: 3.4 (calc) (ref <5.0)
Triglycerides: 138 mg/dL (ref <150)

## 2019-05-25 LAB — MAGNESIUM: Magnesium: 1.8 mg/dL (ref 1.5–2.5)

## 2019-05-25 LAB — PROLACTIN: Prolactin: 62.6 ng/mL — ABNORMAL HIGH

## 2019-08-01 ENCOUNTER — Other Ambulatory Visit: Payer: Self-pay | Admitting: General Surgery

## 2019-08-01 DIAGNOSIS — Z1231 Encounter for screening mammogram for malignant neoplasm of breast: Secondary | ICD-10-CM

## 2019-08-03 ENCOUNTER — Ambulatory Visit: Payer: Self-pay | Attending: Internal Medicine

## 2019-08-03 DIAGNOSIS — Z23 Encounter for immunization: Secondary | ICD-10-CM

## 2019-08-03 NOTE — Progress Notes (Signed)
   Covid-19 Vaccination Clinic  Name:  Wanda Douglas    MRN: [id number] DOB: 11-10-1970  08/03/2019  Wanda Douglas was observed post Covid-19 immunization for 15 minutes without incident. She was provided with Vaccine Information Sheet and instruction to access the V-Safe system.   Wanda Douglas was instructed to call 911 with any severe reactions post vaccine: Marland Kitchen Difficulty breathing  . Swelling of face and throat  . A fast heartbeat  . A bad rash all over body  . Dizziness and weakness   Immunizations Administered    Name Date Dose VIS Date Route   Moderna COVID-19 Vaccine 08/03/2019 10:06 AM 0.5 mL 04/04/2019 Intramuscular   Manufacturer: Moderna   Lot: [id number]   [place][id number]

## 2019-09-05 ENCOUNTER — Ambulatory Visit: Payer: Self-pay | Attending: Internal Medicine

## 2019-09-05 DIAGNOSIS — Z23 Encounter for immunization: Secondary | ICD-10-CM

## 2019-09-05 NOTE — Progress Notes (Signed)
   Covid-19 Vaccination Clinic  Name:  Wanda Douglas    MRN: [id number] DOB: 1971-01-19  09/05/2019  [name] was observed post Covid-19 immunization for 30 minutes based on pre-vaccination screening without incident. She was provided with Vaccine Information Sheet and instruction to access the V-Safe system.   [name] was instructed to call 911 with any severe reactions post vaccine: Marland Kitchen Difficulty breathing  . Swelling of face and throat  . A fast heartbeat  . A bad rash all over body  . Dizziness and weakness   Immunizations Administered    Name Date Dose VIS Date Route   Moderna COVID-19 Vaccine 09/05/2019  8:54 AM 0.5 mL 04/2019 Intramuscular   Manufacturer: Moderna   Lot: [id number]   [place][id number]

## 2019-09-15 ENCOUNTER — Other Ambulatory Visit: Payer: Self-pay | Admitting: General Surgery

## 2019-09-15 DIAGNOSIS — N644 Mastodynia: Secondary | ICD-10-CM

## 2019-10-24 ENCOUNTER — Encounter: Payer: BC Managed Care – PPO | Admitting: Adult Health Nurse Practitioner

## 2019-11-01 NOTE — Progress Notes (Deleted)
Complete Physical  Assessment and Plan:  Diagnoses and all orders for this visit:  Encounter for routine adult health examination without abnormal findings She will follow up with GYN for pelvic/breasts   Intrinsic asthma Well controlled, avoid triggers, continue inhalers  Prolactin secreting Pituitary Adenoma (1997) Followed by Dr. Dwyane Dee ***  Multiple food allergies Avoid triggers, doing well on OTC agents  Intraductal papilloma S/p excisional biopsy, negative Continue annual mammograms Established with Dr. Donne Hazel to follow up if needed   HX of seasonal allergies Continue OTC allergy pills  Mixed hyperlipidemia Mild elevations currently treated by lifestyle modification only Continue low cholesterol diet and exercise.  Check lipid panel.   Morbid obesity Long discussion about weight loss, diet, and exercise Recommended diet heavy in fruits and veggies and low in animal meats, cheeses, and dairy products, appropriate calorie intake Discussed appropriate weight for height  Follow up at next visit  No orders of the defined types were placed in this encounter.    Discussed med's effects and SE's. Screening labs and tests as requested with regular follow-up as recommended. Over 40 minutes of exam, counseling, chart review, and complex, high level critical decision making was performed this visit.   Future Appointments  Date Time Provider [place]  11/02/2019  3:00 PM Liane Comber, NP [facility] None  11/05/2020  3:00 PM Liane Comber, NP [facility] None     HPI  49 y.o. AA female, single, works as Microbiologist presents for a complete physical and follow up for has Intrinsic asthma; Multiple food allergies; Seasonal allergies; Prolactin secreting Pituitary Adenoma (1997); Intraductal papilloma; Hyperlipidemia; Morbid obesity with BMI of 40.0-44.9, adult ([place]); and Vitamin D deficiency on their problem list.    Recent follow up MRI for  pituitary adenoma on 06/2017 showed Stable 3 x 7 mm RIGHT floor of sella, she has since been referred to endocrinology Dr. Dwyane Dee for ongoing management, improved on course of carbogoline and was d/c'd ***  Component     Latest Ref Rng & Units 01/04/2018 02/03/2018 06/08/2018 10/19/2018  Prolactin     ng/mL 85.5 (H) 3.8 (L) 4.0 (L) 2.4 (L)   Component     Latest Ref Rng & Units 05/24/2019  Prolactin     ng/mL 62.6 (H)   She also has recurrent intraductal papilloma, recently in 07/2017 and again in 06/2018 she underwent excisional biopsy and was found to be benign continues to follow with Gen surgeon Dr. Donne Hazel and getting annual mammograms.  Does endorse some bilateral fullness and tenderness without nipple discharge, heat, redness.   She has multiple allergies and mild intermittent asthma, zyrtec, flonase, off of breo, just albuterol PRN and continues to do well with this, arely needing except during the fall.    BMI is There is no height or weight on file to calculate BMI., she has been working on diet and exercise (started last week). She has cut out soda, red meat, reducing sugar/carbs. Sleeps well at night.  Wt Readings from Last 3 Encounters:  05/24/19 246 lb (111.6 kg)  10/20/18 235 lb (106.6 kg)  10/19/18 234 lb 6.4 oz (106.3 kg)   Her blood pressure has been controlled at home, today their BP is   She does workout. She denies chest pain, shortness of breath, dizziness.   She is not on cholesterol medication and denies myalgias. Her cholesterol is not at goal. The cholesterol last visit was:   Lab Results  Component Value Date   CHOL 191 05/24/2019  HDL 56 05/24/2019   LDLCALC 109 (H) 05/24/2019   TRIG 138 05/24/2019   CHOLHDL 3.4 05/24/2019   Last A1C in the office was:  Lab Results  Component Value Date   HGBA1C 5.4 10/20/2018   Last GFR: Lab Results  Component Value Date   GFRAA 97 05/24/2019   Patient is on Vitamin D supplement, ? 5000 IU daily    Lab Results   Component Value Date   VD25OH 57 10/20/2018       Current Medications:  Current Outpatient Medications on File Prior to Visit  Medication Sig Dispense Refill  . albuterol (VENTOLIN HFA) 108 (90 Base) MCG/ACT inhaler 1 to 2 puffs (5 minutes apart) every 4 hour as needed for Asthma 18 g 1  . Azelastine HCl 137 MCG/SPRAY SOLN PLACE 2 SPRAYS INTO BOTH NOSTRILS 2 (TWO) TIMES DAILY. USE IN EACH NOSTRIL AS DIRECTED 30 mL 1  . calcium carbonate (TUMS EX) 750 MG chewable tablet Chew 2 tablets by mouth daily.    . cetirizine (ZYRTEC) 10 MG tablet Take 1 tablet (10 mg total) by mouth daily. 90 tablet 1  . cholecalciferol (VITAMIN D) 1000 UNITS tablet Take 1,000 Units by mouth daily.    . famotidine (PEPCID) 20 MG tablet TAKE 1 TABLET (20 MG TOTAL) BY MOUTH 2 (TWO) TIMES DAILY BEFORE A MEAL. 180 tablet 0  . fluticasone (FLONASE) 50 MCG/ACT nasal spray Place 1 spray into both nostrils daily for 3 days. 1 g 0  . ibuprofen (ADVIL,MOTRIN) 800 MG tablet Take 1 tablet (800 mg total) by mouth 3 (three) times daily. (Patient taking differently: 800 mg as needed. ) 21 tablet 0  . Multiple Vitamin (MULTIVITAMIN) tablet Take 1 tablet by mouth daily.     No current facility-administered medications on file prior to visit.   Allergies:  Allergies  Allergen Reactions  . Banana Hives and Shortness Of Breath  . Peanut-Containing Drug Products Hives and Shortness Of Breath    All nuts (allergic) hives   . Shellfish Allergy Hives and Shortness Of Breath  . Chlorhexidine Hives   Medical History:  She has Intrinsic asthma; Multiple food allergies; Seasonal allergies; Prolactin secreting Pituitary Adenoma (1997); Intraductal papilloma; Hyperlipidemia; Morbid obesity with BMI of 40.0-44.9, adult ([place]); and Vitamin D deficiency on their problem list. Health Maintenance:   Immunization History  Administered Date(s) Administered  . Moderna SARS-COVID-2 Vaccination 08/03/2019, 09/05/2019  . Tdap 10/19/2017     Tetanus: 2019 Pneumovax:-  Prevnar 13: - Flu vaccine: n/a  Zostavax: n/a  Covid 19: 2/2, 2021, moderna  LMP: No LMP recorded.  Pap: Remote, never sexually active, will follow up with GYN *** MGM: 06/2017, R side 04/2018, overdue *** DEXA: n/a  Colonoscopy: never, family history, colonoscopy at 44 EGD: -  Last Dental Exam: Dr. ?, last 2 years ago, will schedule  Last Eye Exam: 2019, wears glasses, will follow up  Patient Care Team: Unk Pinto, MD as PCP - General (Internal Medicine)  Surgical History:  She has a past surgical history that includes Tonsillectomy; Radioactive seed guided excisional breast biopsy (Left, 07/26/2017); Breast ductal system excision (Left, 07/26/2017); right breast biopsy (Right, 2019); Breast excisional biopsy (Left, 07/26/2017); and Radioactive seed guided excisional breast biopsy (Right, 06/16/2018). Family History:  Herfamily history includes Breast cancer in her paternal grandmother; Diabetes in her maternal grandfather and paternal grandmother; Drug abuse in her mother; Hypertension in her maternal grandfather and mother; Lung cancer in her maternal grandmother; Myopathy in her father; Rheum arthritis  in her maternal grandmother; Spina bifida in her mother. Social History:  She reports that she has never smoked. She has never used smokeless tobacco. She reports that she does not drink alcohol and does not use drugs.  Review of Systems: Review of Systems  Constitutional: Negative for malaise/fatigue and weight loss.  HENT: Negative for hearing loss and tinnitus.   Eyes: Negative for blurred vision and double vision.  Respiratory: Negative for cough, sputum production, shortness of breath and wheezing.   Cardiovascular: Negative for chest pain, palpitations, orthopnea, claudication, leg swelling and PND.  Gastrointestinal: Negative for abdominal pain, blood in stool, constipation, diarrhea, heartburn, melena, nausea and vomiting.   Genitourinary: Negative.   Musculoskeletal: Negative for falls, joint pain and myalgias.  Skin: Negative for rash.  Neurological: Negative for dizziness, tingling, sensory change, weakness and headaches.  Endo/Heme/Allergies: Negative for polydipsia.  Psychiatric/Behavioral: Negative.  Negative for depression, memory loss, substance abuse and suicidal ideas. The patient is not nervous/anxious and does not have insomnia.   All other systems reviewed and are negative.   Physical Exam: Estimated body mass index is 42.23 kg/m as calculated from the following:   Height as of 10/20/18: 5\' 4"  (1.626 m).   Weight as of 05/24/19: 246 lb (111.6 kg). There were no vitals taken for this visit. General Appearance: Well nourished, in no apparent distress.  Eyes: PERRLA, EOMs, conjunctiva no swelling or erythema Sinuses: No Frontal/maxillary tenderness  ENT/Mouth: Ext aud canals clear, normal light reflex with TMs without erythema, bulging. Good dentition. No erythema, swelling, or exudate on post pharynx. Tonsils not swollen or erythematous. Hearing normal.  Neck: Supple, thyroid normal. No bruits  Respiratory: Respiratory effort normal, BS equal bilaterally without rales, rhonchi, wheezing or stridor.  Cardio: RRR without murmurs, rubs or gallops. Brisk peripheral pulses without edema.  Chest: symmetric, with normal excursions and percussion.  Breasts: Defer to GYN, will schedule soon  Abdomen: Soft, nontender, no guarding, rebound, hernias, masses, or organomegaly.  Lymphatics: Non tender without lymphadenopathy.  Genitourinary: Defer to GYN, will schedule soon  Musculoskeletal: Full ROM all peripheral extremities,5/5 strength, and normal gait.  Skin: Warm, dry without rashes, lesions, ecchymosis. Neuro: Cranial nerves intact, reflexes equal bilaterally. Normal muscle tone, no cerebellar symptoms. Sensation intact.  Psych: Awake and oriented X 3, normal affect, Insight and Judgment appropriate.    EKG: WNL no ST changes. ***  Wanda Douglas 1:25 PM [facility]

## 2019-11-02 ENCOUNTER — Encounter: Payer: BLUE CROSS/BLUE SHIELD | Admitting: Adult Health

## 2020-01-22 ENCOUNTER — Other Ambulatory Visit: Payer: Self-pay | Admitting: General Surgery

## 2020-01-22 DIAGNOSIS — Z1231 Encounter for screening mammogram for malignant neoplasm of breast: Secondary | ICD-10-CM

## 2020-01-26 ENCOUNTER — Other Ambulatory Visit: Payer: Self-pay | Admitting: General Surgery

## 2020-01-26 DIAGNOSIS — N644 Mastodynia: Secondary | ICD-10-CM

## 2020-01-26 DIAGNOSIS — N6452 Nipple discharge: Secondary | ICD-10-CM

## 2020-01-29 ENCOUNTER — Other Ambulatory Visit: Payer: Self-pay | Admitting: General Surgery

## 2020-01-29 ENCOUNTER — Ambulatory Visit
Admission: RE | Admit: 2020-01-29 | Discharge: 2020-01-29 | Disposition: A | Discharge status: Home / Self Care | Payer: BLUE CROSS/BLUE SHIELD | Source: Ambulatory Visit | Attending: General Surgery | Admitting: General Surgery

## 2020-01-29 ENCOUNTER — Other Ambulatory Visit: Payer: Self-pay

## 2020-01-29 DIAGNOSIS — N6452 Nipple discharge: Secondary | ICD-10-CM

## 2020-01-29 DIAGNOSIS — N644 Mastodynia: Secondary | ICD-10-CM

## 2020-01-29 DIAGNOSIS — N6320 Unspecified lump in the left breast, unspecified quadrant: Secondary | ICD-10-CM

## 2020-02-02 ENCOUNTER — Encounter: Payer: Self-pay | Admitting: Adult Health

## 2020-02-02 NOTE — Progress Notes (Signed)
Complete Physical  Assessment and Plan:  Diagnoses and all orders for this visit:  Encounter for routine adult health examination without abnormal findings She will follow up with GYN for pelvic/breasts   Intrinsic asthma Well controlled, avoid triggers, continue inhalers  Prolactin secreting Pituitary Adenoma (1997) Followed by Dr. Dwyane Dee, responded well to cabogoline -  Check protactin, will refer back if elevated   Multiple food allergies Avoid triggers, doing well on OTC agents  Intraductal papilloma S/p excisional biopsy that was negative Had repeat biopsy of new on L today -  Continue annual mammograms Established with Dr. Donne Hazel to follow up   HX of seasonal allergies Continue OTC allergy pills  Mixed hyperlipidemia Mild elevations currently treated by lifestyle modification only Continue low cholesterol diet and exercise.  Check lipid panel.   Morbid obesity Long discussion about weight loss, diet, and exercise Recommended diet heavy in fruits and veggies and low in animal meats, cheeses, and dairy products, appropriate calorie intake Discussed appropriate weight for height  Will start the patient on phentermine- hand out given and AE's discussed, will do close follow up. Return in 4 weeks, obtain EKG at that time Start monitoring food intake via log or app to review next visit    Orders Placed This Encounter  Procedures  . CBC with Differential/Platelet  . COMPLETE METABOLIC PANEL WITH GFR  . Lipid panel  . TSH  . Hemoglobin A1c  . Insulin, random  . VITAMIN D 25 Hydroxy (Vit-D Deficiency, Fractures)  . Urinalysis, Routine w reflex microscopic  . Prolactin    Discussed med's effects and SE's. Screening labs and tests as requested with regular follow-up as recommended. Over 40 minutes of exam, counseling, chart review, and complex, high level critical decision making was performed this visit.   Future Appointments  Date Time Provider [place]  03/06/2020  4:15 PM Liane Comber, NP [facility] None  08/06/2020  4:15 PM Liane Comber, NP [facility] None  02/04/2021  3:00 PM Liane Comber, NP [facility] None     HPI  49 y.o. AA female, presents for a complete physical and follow up for has Intrinsic asthma; Multiple food allergies; Seasonal allergies; Prolactin secreting Pituitary Adenoma (1997); Intraductal papilloma; Hyperlipidemia; Morbid obesity with BMI of 40.0-44.9, adult ([place]); and Vitamin D deficiency on their problem list.    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 Did do some counseling which was helpful.   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 Dwyane Dee for ongoing management and now treated by cabogoline. Prolactin in Jan 2021 was back up to 62.6 and advised to follow up with him but forgot.   She also has recurrent intraductal papilloma, recently in 07/2017 on L and again in 06/2018 on R she underwent excisional biopsy and was found to be benign continues to follow with Gen surgeon Dr. Donne Hazel and getting annual mammograms, had recent bil diagnostic 01/29/2020 and [place] which found two indeterminate intraductal masses in the subareolar left breast, due to history question of papillomatosis, recommended ultrasound-guided biopsy of left breast, had today. Has been advised further evaluation with MRI should be considered given the multiplicity of findings, prior to additional.  She has multiple allergies and mild intermittent asthma,  zyrtec, flonase, off of breo, just albuterol PRN and continues to do well with this, arely needing except during the fall.    BMI is Body mass index is 44.08 kg/m., she has been working on diet and  exercise,  Some weight gain with family members were ill, mom passed away from cancer in 11/03/22, was overeating. Has been eating at home more and doing better, increasing water, fruit/veggies, reducing carbs/sweets, keeping out of the house She walks a lot, on her feet all day and walking at work, wants to start tracking steps Wt Readings from Last 3 Encounters:  02/05/20 256 lb 12.8 oz (116.5 kg)  05/24/19 246 lb (111.6 kg)  10/20/18 235 lb (106.6 kg)   Her blood pressure has been controlled at home, today their BP is BP: 110/76 She does workout. She denies chest pain, shortness of breath, dizziness.   She is not on cholesterol medication and denies myalgias. Her cholesterol is not at goal. The cholesterol last visit was:   Lab Results  Component Value Date   CHOL 191 05/24/2019   HDL 56 05/24/2019   LDLCALC 109 (H) 05/24/2019   TRIG 138 05/24/2019   CHOLHDL 3.4 05/24/2019   Last A1C in the office was:  Lab Results  Component Value Date   HGBA1C 5.4 10/20/2018   Last GFR: Lab Results  Component Value Date   GFRAA 97 05/24/2019   Patient is on Vitamin D supplement, ? 5000 IU daily    Lab Results  Component Value Date   VD25OH 41 10/20/2018        Current Medications:  Current Outpatient Medications on File Prior to Visit  Medication Sig Dispense Refill  . albuterol (VENTOLIN HFA) 108 (90 Base) MCG/ACT inhaler 1 to 2 puffs (5 minutes apart) every 4 hour as needed for Asthma 18 g 1  . Azelastine HCl 137 MCG/SPRAY SOLN PLACE 2 SPRAYS INTO BOTH NOSTRILS 2 (TWO) TIMES DAILY. USE IN EACH NOSTRIL AS DIRECTED 30 mL 1  . calcium carbonate (TUMS EX) 750 MG chewable tablet Chew 2 tablets by mouth daily.    . cetirizine (ZYRTEC) 10 MG tablet Take 1 tablet (10 mg total) by mouth daily. 90 tablet 1  . cholecalciferol (VITAMIN D) 1000 UNITS tablet Take 1,000 Units by mouth daily.    . fluticasone (FLONASE) 50 MCG/ACT nasal spray Place 1 spray into both nostrils daily for 3 days. 1 g 0   . ibuprofen (ADVIL,MOTRIN) 800 MG tablet Take 1 tablet (800 mg total) by mouth 3 (three) times daily. (Patient taking differently: 800 mg as needed. ) 21 tablet 0  . Multiple Vitamin (MULTIVITAMIN) tablet Take 1 tablet by mouth daily.     No current facility-administered medications on file prior to visit.   Allergies:  Allergies  Allergen Reactions  . Banana Hives and Shortness Of Breath  . Peanut-Containing Drug Products Hives and Shortness Of Breath    All nuts (allergic) hives   . Shellfish Allergy Hives and Shortness Of Breath  . Chlorhexidine Hives   Medical History:  She has Intrinsic asthma; Multiple food allergies; Seasonal allergies; Prolactin secreting Pituitary Adenoma (1997); Intraductal papilloma; Hyperlipidemia; Morbid obesity with BMI of 40.0-44.9, adult ([place]); and Vitamin D deficiency on their problem list. Health Maintenance:   Immunization History  Administered Date(s) Administered  . Moderna SARS-COVID-2 Vaccination 08/03/2019, 09/05/2019  . Tdap 10/19/2017    Tetanus: 2019 Pneumovax:-  Prevnar 13: - Flu vaccine: declines  Zostavax: n/a  Covid 19: 2/2, 2021, moderna  LMP: No LMP recorded.  Pap: Remote, never sexually active, will follow up with GYN, has number to schedule MGM: 01/29/2020 bil diagnostic, had biopsy today  DEXA: n/a  Colonoscopy: never, due age 64 EGD: -  Last Dental Exam: Dr. ?, last 3 years ago, will schedule  Last Eye Exam: 2021, wears glasses  Patient Care Team: Unk Pinto, MD as PCP - General (Internal Medicine)  Surgical History:  She has a past surgical history that includes Tonsillectomy; Radioactive seed guided excisional breast biopsy (Left, 07/26/2017); Breast ductal system excision (Left, 07/26/2017); right breast biopsy (Right, 2019); and Radioactive seed guided excisional breast biopsy (Right, 06/16/2018). Family History:  Herfamily history includes Bladder Cancer in her mother and paternal grandmother; Breast cancer  in her paternal grandmother; Diabetes in her maternal grandfather and paternal grandmother; Drug abuse in her mother; Hypertension in her maternal grandfather and mother; Lung cancer in her maternal grandmother; Myopathy in her father; Rheum arthritis in her maternal grandmother; Spina bifida in her mother. Social History:  She reports that she has never smoked. She has never used smokeless tobacco. She reports that she does not drink alcohol and does not use drugs.  Review of Systems: Review of Systems  Constitutional: Negative for malaise/fatigue and weight loss.  HENT: Negative for hearing loss and tinnitus.   Eyes: Negative for blurred vision and double vision.  Respiratory: Negative for cough, sputum production, shortness of breath and wheezing.   Cardiovascular: Negative for chest pain, palpitations, orthopnea, claudication, leg swelling and PND.  Gastrointestinal: Negative for abdominal pain, blood in stool, constipation, diarrhea, heartburn, melena, nausea and vomiting.  Genitourinary: Negative.   Musculoskeletal: Negative for falls, joint pain and myalgias.  Skin: Negative for rash.  Neurological: Negative for dizziness, tingling, sensory change, weakness and headaches.  Endo/Heme/Allergies: Negative for polydipsia.  Psychiatric/Behavioral: Negative.  Negative for depression, memory loss, substance abuse and suicidal ideas. The patient is not nervous/anxious and does not have insomnia.   All other systems reviewed and are negative.   Physical Exam: Estimated body mass index is 44.08 kg/m as calculated from the following:   Height as of this encounter: 5\' 4"  (1.626 m).   Weight as of this encounter: 256 lb 12.8 oz (116.5 kg). BP 110/76   Pulse 82   Temp (!) 97.3 F (36.3 C)   Ht 5\' 4"  (1.626 m)   Wt 256 lb 12.8 oz (116.5 kg)   SpO2 97%   BMI 44.08 kg/m  General Appearance: Well nourished, in no apparent distress.  Eyes: PERRLA, EOMs, conjunctiva no swelling or  erythema Sinuses: No Frontal/maxillary tenderness  ENT/Mouth: Ext aud canals clear, normal light reflex with TMs without erythema, bulging. Good dentition. No erythema, swelling, or exudate on post pharynx. Tonsils not swollen or erythematous. Hearing normal.  Neck: Supple, thyroid normal. No bruits  Respiratory: Respiratory effort normal, BS equal bilaterally without rales, rhonchi, wheezing or stridor.  Cardio: RRR without murmurs, rubs or gallops. Brisk peripheral pulses without edema.  Chest: symmetric, with normal excursions and percussion.  Breasts: Defer, just had extensively at breast center; biopsy today Abdomen: Soft, nontender, no guarding, rebound, hernias, masses, or organomegaly.  Lymphatics: Non tender without lymphadenopathy.  Genitourinary: Defer to GYN, will schedule soon  Musculoskeletal: Full ROM all peripheral extremities,5/5 strength, and normal gait.  Skin: Warm, dry without rashes, lesions, ecchymosis. Neuro: Cranial nerves intact, reflexes equal bilaterally. Normal muscle tone, no cerebellar symptoms. Sensation intact.  Psych: Awake and oriented X 3, normal affect, Insight and Judgment appropriate.  EKG: WNL in baseline reviewed, defer today  Izora Ribas 4:13 PM [facility]

## 2020-02-05 ENCOUNTER — Encounter: Payer: Self-pay | Admitting: Adult Health

## 2020-02-05 ENCOUNTER — Ambulatory Visit
Admission: RE | Admit: 2020-02-05 | Discharge: 2020-02-05 | Disposition: A | Discharge status: Home / Self Care | Payer: BLUE CROSS/BLUE SHIELD | Source: Ambulatory Visit | Attending: General Surgery | Admitting: General Surgery

## 2020-02-05 ENCOUNTER — Other Ambulatory Visit: Payer: Self-pay

## 2020-02-05 ENCOUNTER — Ambulatory Visit: Payer: BLUE CROSS/BLUE SHIELD | Admitting: Adult Health

## 2020-02-05 VITALS — BP 110/76 | HR 82 | Temp 97.3°F | Ht 64.0 in | Wt 256.8 lb

## 2020-02-05 DIAGNOSIS — Z131 Encounter for screening for diabetes mellitus: Secondary | ICD-10-CM

## 2020-02-05 DIAGNOSIS — Z1329 Encounter for screening for other suspected endocrine disorder: Secondary | ICD-10-CM

## 2020-02-05 DIAGNOSIS — Z79899 Other long term (current) drug therapy: Secondary | ICD-10-CM

## 2020-02-05 DIAGNOSIS — E559 Vitamin D deficiency, unspecified: Secondary | ICD-10-CM | POA: Diagnosis not present

## 2020-02-05 DIAGNOSIS — Z13220 Encounter for screening for lipoid disorders: Secondary | ICD-10-CM | POA: Diagnosis not present

## 2020-02-05 DIAGNOSIS — Z136 Encounter for screening for cardiovascular disorders: Secondary | ICD-10-CM

## 2020-02-05 DIAGNOSIS — N6320 Unspecified lump in the left breast, unspecified quadrant: Secondary | ICD-10-CM

## 2020-02-05 DIAGNOSIS — N6452 Nipple discharge: Secondary | ICD-10-CM

## 2020-02-05 DIAGNOSIS — Z1389 Encounter for screening for other disorder: Secondary | ICD-10-CM

## 2020-02-05 DIAGNOSIS — J302 Other seasonal allergic rhinitis: Secondary | ICD-10-CM

## 2020-02-05 DIAGNOSIS — D369 Benign neoplasm, unspecified site: Secondary | ICD-10-CM

## 2020-02-05 DIAGNOSIS — Z0000 Encounter for general adult medical examination without abnormal findings: Secondary | ICD-10-CM

## 2020-02-05 DIAGNOSIS — J45909 Unspecified asthma, uncomplicated: Secondary | ICD-10-CM

## 2020-02-05 DIAGNOSIS — Z0001 Encounter for general adult medical examination with abnormal findings: Secondary | ICD-10-CM

## 2020-02-05 DIAGNOSIS — E782 Mixed hyperlipidemia: Secondary | ICD-10-CM

## 2020-02-05 DIAGNOSIS — D352 Benign neoplasm of pituitary gland: Secondary | ICD-10-CM

## 2020-02-05 MED ORDER — PHENTERMINE HCL 37.5 MG PO TABS: ORAL_TABLET | ORAL | 0 refills | Status: DC

## 2020-02-05 NOTE — Patient Instructions (Addendum)
Wanda Douglas , Thank you for taking time to come for your Annual Wellness Visit. I appreciate your ongoing commitment to your health goals. Please review the following plan we discussed and let me know if I can assist you in the future.   These are the goals we discussed: Goals    . Exercise 150 min/wk Moderate Activity    . LDL CALC < 100    . Weight (lb) < 215 lb (97.5 kg)     Weigh weekly and keep a log for progress monitoring       This is a list of the screening recommended for you and due dates:  Health Maintenance  Topic Date Due  .  Hepatitis C: One time screening is recommended by Center for Disease Control  (CDC) for  adults born from [birth year] through 1965.   Never done  . HIV Screening  Never done  . Pap Smear  Never done  . Flu Shot  Never done  . Tetanus Vaccine  10/20/2027  . COVID-19 Vaccine  Completed     Schedule dental appointment this afternoon     Recommend getting an app to track food/exercise/water - choose one that helps track calories and macros (fat/protein/carbohydrate)  Also measure food for portions - can review at follow up together  Try 1/2-1 tab phentermine with breakfast for appetite - start with low dose and increase only if needed. Good to take breaks off of this medication every few months for a long weekend      High-Fiber Diet Fiber, also called dietary fiber, is a type of carbohydrate that is found in fruits, vegetables, whole grains, and beans. A high-fiber diet can have many health benefits. Your health care provider may recommend a high-fiber diet to help:  Prevent constipation. Fiber can make your bowel movements more regular.  Lower your cholesterol.  Relieve the following conditions: ? Swelling of veins in the anus (hemorrhoids). ? Swelling and irritation (inflammation) of specific areas of the digestive tract (uncomplicated diverticulosis). ? A problem of the large intestine (colon) that sometimes causes pain and diarrhea  (irritable bowel syndrome, IBS).  Prevent overeating as part of a weight-loss plan.  Prevent heart disease, type 2 diabetes, and certain cancers. What is my plan? The recommended daily fiber intake in grams (g) includes:  38 g for men age 58 or younger.  30 g for men over age 62.  76 g for women age 9 or younger.  21 g for women over age 25. You can get the recommended daily intake of dietary fiber by:  Eating a variety of fruits, vegetables, grains, and beans.  Taking a fiber supplement, if it is not possible to get enough fiber through your diet. What do I need to know about a high-fiber diet?  It is better to get fiber through food sources rather than from fiber supplements. There is not a lot of research about how effective supplements are.  Always check the fiber content on the nutrition facts label of any prepackaged food. Look for foods that contain 5 g of fiber or more per serving.  Talk with a diet and nutrition specialist (dietitian) if you have questions about specific foods that are recommended or not recommended for your medical condition, especially if those foods are not listed below.  Gradually increase how much fiber you consume. If you increase your intake of dietary fiber too quickly, you may have bloating, cramping, or gas.  Drink plenty of water. Water helps  you to digest fiber. What are tips for following this plan?  Eat a wide variety of high-fiber foods.  Make sure that half of the grains that you eat each day are whole grains.  Eat breads and cereals that are made with whole-grain flour instead of refined flour or white flour.  Eat brown rice, bulgur wheat, or millet instead of white rice.  Start the day with a breakfast that is high in fiber, such as a cereal that contains 5 g of fiber or more per serving.  Use beans in place of meat in soups, salads, and pasta dishes.  Eat high-fiber snacks, such as berries, raw vegetables, nuts, and  popcorn.  Choose whole fruits and vegetables instead of processed forms like juice or sauce. What foods can I eat?  Fruits Berries. Pears. Apples. Oranges. Avocado. Prunes and raisins. Dried figs. Vegetables Sweet potatoes. Spinach. Kale. Artichokes. Cabbage. Broccoli. Cauliflower. Green peas. Carrots. Squash. Grains Whole-grain breads. Multigrain cereal. Oats and oatmeal. Brown rice. Barley. Bulgur wheat. [place]. Quinoa. Bran muffins. Popcorn. Rye wafer crackers. Meats and other proteins Navy, kidney, and pinto beans. Soybeans. Split peas. Lentils. Nuts and seeds. Dairy Fiber-fortified yogurt. Beverages Fiber-fortified soy milk. Fiber-fortified orange juice. Other foods Fiber bars. The items listed above may not be a complete list of recommended foods and beverages. Contact a dietitian for more options. What foods are not recommended? Fruits Fruit juice. Cooked, strained fruit. Vegetables Fried potatoes. Canned vegetables. Well-cooked vegetables. Grains White bread. Pasta made with refined flour. White rice. Meats and other proteins Fatty cuts of meat. Fried chicken or fried fish. Dairy Milk. Yogurt. Cream cheese. Sour cream. Fats and oils Butters. Beverages Soft drinks. Other foods Cakes and pastries. The items listed above may not be a complete list of foods and beverages to avoid. Contact a dietitian for more information. Summary  Fiber is a type of carbohydrate. It is found in fruits, vegetables, whole grains, and beans.  There are many health benefits of eating a high-fiber diet, such as preventing constipation, lowering blood cholesterol, helping with weight loss, and reducing your risk of heart disease, diabetes, and certain cancers.  Gradually increase your intake of fiber. Increasing too fast can result in cramping, bloating, and gas. Drink plenty of water while you increase your fiber.  The best sources of fiber include whole fruits and vegetables, whole  grains, nuts, seeds, and beans. This information is not intended to replace advice given to you by your health care provider. Make sure you discuss any questions you have with your health care provider. Document Revised: 02/22/2017 Document Reviewed: 02/22/2017 Elsevier Patient Education  [place].     Phentermine tablets or capsules What is this medicine? PHENTERMINE (FEN ter meen) decreases your appetite. It is used with a reduced calorie diet and exercise to help you lose weight. This medicine may be used for other purposes; ask your health care provider or pharmacist if you have questions. COMMON BRAND NAME(S): Adipex-P, Atti-Plex P, Atti-Plex P Spansule, Fastin, Lomaira, Pro-Fast, Tara-8 What should I tell my health care provider before I take this medicine? They need to know if you have any of these conditions:  agitation or nervousness  diabetes  glaucoma  heart disease  high blood pressure  history of drug abuse or addiction  history of stroke  kidney disease  lung disease called Primary Pulmonary Hypertension (PPH)  taken an MAOI like Carbex, Eldepryl, Marplan, Nardil, or Parnate in last 14 days  taking stimulant medicines for attention  disorders, weight loss, or to stay awake  thyroid disease  an unusual or allergic reaction to phentermine, other medicines, foods, dyes, or preservatives  pregnant or trying to get pregnant  breast-feeding How should I use this medicine? Take this medicine by mouth with a glass of water. Follow the directions on the prescription label. Take your medicine at regular intervals. Do not take it more often than directed. Do not stop taking except on your doctor's advice. Talk to your pediatrician regarding the use of this medicine in children. While this drug may be prescribed for children 17 years or older for selected conditions, precautions do apply. Overdosage: If you think you have taken too much of this medicine  contact a poison control center or emergency room at once. NOTE: This medicine is only for you. Do not share this medicine with others. What if I miss a dose? If you miss a dose, take it as soon as you can. If it is almost time for your next dose, take only that dose. Do not take double or extra doses. What may interact with this medicine? Do not take this medicine with any of the following medications:  MAOIs like Carbex, Eldepryl, Marplan, Nardil, and Parnate This medicine may also interact with the following medications:  alcohol  certain medicines for depression, anxiety, or psychotic disorders  certain medicines for high blood pressure  linezolid  medicines for colds or breathing difficulties like pseudoephedrine or phenylephrine  medicines for diabetes  sibutramine  stimulant medicines for attention disorders, weight loss, or to stay awake This list may not describe all possible interactions. Give your health care provider a list of all the medicines, herbs, non-prescription drugs, or dietary supplements you use. Also tell them if you smoke, drink alcohol, or use illegal drugs. Some items may interact with your medicine. What should I watch for while using this medicine? Visit your doctor or health care provider for regular checks on your progress. Do not stop taking except on your health care provider's advice. You may develop a severe reaction. Your health care provider will tell you how much medicine to take. Do not take this medicine close to bedtime. It may prevent you from sleeping. You may get drowsy or dizzy. Do not drive, use machinery, or do anything that needs mental alertness until you know how this medicine affects you. Do not stand or sit up quickly, especially if you are an older patient. This reduces the risk of dizzy or fainting spells. Alcohol may increase dizziness and drowsiness. Avoid alcoholic drinks. This medicine may affect blood sugar levels. Ask your  healthcare provider if changes in diet or medicines are needed if you have diabetes. Women should inform their health care provider if they wish to become pregnant or think they might be pregnant. Losing weight while pregnant is not advised and may cause harm to the unborn child. Talk to your health care provider for more information. What side effects may I notice from receiving this medicine? Side effects that you should report to your doctor or health care professional as soon as possible:  allergic reactions like skin rash, itching or hives, swelling of the face, lips, or tongue  breathing problems  changes in emotions or moods  changes in vision  chest pain or chest tightness  fast, irregular heartbeat  feeling faint or lightheaded  increased blood pressure  irritable  restlessness  tremors  seizures  signs and symptoms of a stroke like changes in vision; confusion; trouble  speaking or understanding; severe headaches; sudden numbness or weakness of the face, arm or leg; trouble walking; dizziness; loss of balance or coordination  unusually weak or tired Side effects that usually do not require medical attention (report to your doctor or health care professional if they continue or are bothersome):  changes in taste  constipation or diarrhea  dizziness  dry mouth  headache  trouble sleeping  upset stomach This list may not describe all possible side effects. Call your doctor for medical advice about side effects. You may report side effects to FDA at 1-800-FDA-1088. Where should I keep my medicine? Keep out of the reach of children. This medicine can be abused. Keep your medicine in a safe place to protect it from theft. Do not share this medicine with anyone. Selling or giving away this medicine is dangerous and against the law. This medicine may cause harm and death if it is taken by other adults, children, or pets. Return medicine that has not been used to an  official disposal site. Contact the DEA at (615)196-1081 or your city/county government to find a site. If you cannot return the medicine, mix any unused medicine with a substance like cat litter or coffee grounds. Then throw the medicine away in a sealed container like a sealed bag or coffee can with a lid. Do not use the medicine after the expiration date. Store at room temperature between 20 and 25 degrees C (68 and 77 degrees F). Keep container tightly closed. NOTE: This sheet is a summary. It may not cover all possible information. If you have questions about this medicine, talk to your doctor, pharmacist, or health care provider.  2020 Elsevier/Gold Standard (2019-02-24 12:54:20)

## 2020-02-06 LAB — LIPID PANEL
Cholesterol: 178 mg/dL (ref <200)
HDL: 57 mg/dL (ref >=50)
LDL Cholesterol (Calc): 100 mg/dL (calc) — ABNORMAL HIGH
Non-HDL Cholesterol (Calc): 121 mg/dL (calc) (ref <130)
Total CHOL/HDL Ratio: 3.1 (calc) (ref <5.0)
Triglycerides: 116 mg/dL (ref <150)

## 2020-02-06 LAB — CBC WITH DIFFERENTIAL/PLATELET
Absolute Monocytes: 680 cells/uL (ref 200–950)
Basophils Absolute: 43 cells/uL (ref 0–200)
Basophils Relative: 0.5 %
Eosinophils Absolute: 425 cells/uL (ref 15–500)
Eosinophils Relative: 5 %
HCT: 40.3 % (ref 35.0–45.0)
Hemoglobin: 12.4 g/dL (ref 11.7–15.5)
Lymphs Abs: 3273 cells/uL (ref 850–3900)
MCH: 25.8 pg — ABNORMAL LOW (ref 27.0–33.0)
MCHC: 30.8 g/dL — ABNORMAL LOW (ref 32.0–36.0)
MCV: 83.8 fL (ref 80.0–100.0)
MPV: 11.2 fL (ref 7.5–12.5)
Monocytes Relative: 8 %
Neutro Abs: 4080 cells/uL (ref 1500–7800)
Neutrophils Relative %: 48 %
Platelets: 225 10*3/uL (ref 140–400)
RBC: 4.81 10*6/uL (ref 3.80–5.10)
RDW: 13.8 % (ref 11.0–15.0)
Total Lymphocyte: 38.5 %
WBC: 8.5 10*3/uL (ref 3.8–10.8)

## 2020-02-06 LAB — URINALYSIS, ROUTINE W REFLEX MICROSCOPIC
Bacteria, UA: NONE SEEN /HPF
Bilirubin Urine: NEGATIVE
Glucose, UA: NEGATIVE
Hgb urine dipstick: NEGATIVE
Hyaline Cast: NONE SEEN /LPF
Ketones, ur: NEGATIVE
Nitrite: NEGATIVE
Protein, ur: NEGATIVE
RBC / HPF: NONE SEEN /HPF (ref 0–2)
Specific Gravity, Urine: 1.025 (ref 1.001–1.03)
pH: 5.5 (ref 5.0–8.0)

## 2020-02-06 LAB — COMPLETE METABOLIC PANEL WITH GFR
AG Ratio: 1.2 (calc) (ref 1.0–2.5)
ALT: 13 U/L (ref 6–29)
AST: 15 U/L (ref 10–35)
Albumin: 3.7 g/dL (ref 3.6–5.1)
Alkaline phosphatase (APISO): 77 U/L (ref 31–125)
BUN: 19 mg/dL (ref 7–25)
CO2: 26 mmol/L (ref 20–32)
Calcium: 9.1 mg/dL (ref 8.6–10.2)
Chloride: 107 mmol/L (ref 98–110)
Creat: 0.96 mg/dL (ref 0.50–1.10)
GFR, Est African American: 81 mL/min/{1.73_m2} (ref >=60)
GFR, Est Non African American: 70 mL/min/{1.73_m2} (ref >=60)
Globulin: 3 g/dL (calc) (ref 1.9–3.7)
Glucose, Bld: 103 mg/dL — ABNORMAL HIGH (ref 65–99)
Potassium: 4 mmol/L (ref 3.5–5.3)
Sodium: 139 mmol/L (ref 135–146)
Total Bilirubin: 0.3 mg/dL (ref 0.2–1.2)
Total Protein: 6.7 g/dL (ref 6.1–8.1)

## 2020-02-06 LAB — HEMOGLOBIN A1C
Hgb A1c MFr Bld: 5.6 % of total Hgb (ref <5.7)
Mean Plasma Glucose: 114 (calc)
eAG (mmol/L): 6.3 (calc)

## 2020-02-06 LAB — TSH: TSH: 0.42 mIU/L

## 2020-02-06 LAB — VITAMIN D 25 HYDROXY (VIT D DEFICIENCY, FRACTURES): Vit D, 25-Hydroxy: 21 ng/mL — ABNORMAL LOW (ref 30–100)

## 2020-02-06 LAB — PROLACTIN: Prolactin: 93 ng/mL — ABNORMAL HIGH

## 2020-02-06 LAB — INSULIN, RANDOM: Insulin: 97.7 u[IU]/mL — ABNORMAL HIGH

## 2020-02-19 ENCOUNTER — Encounter: Payer: Self-pay | Admitting: Endocrinology

## 2020-02-19 ENCOUNTER — Ambulatory Visit (INDEPENDENT_AMBULATORY_CARE_PROVIDER_SITE_OTHER): Payer: BLUE CROSS/BLUE SHIELD | Admitting: Endocrinology

## 2020-02-19 ENCOUNTER — Other Ambulatory Visit: Payer: Self-pay

## 2020-02-19 VITALS — BP 116/88 | HR 89 | Ht 64.0 in | Wt 251.4 lb

## 2020-02-19 DIAGNOSIS — D352 Benign neoplasm of pituitary gland: Secondary | ICD-10-CM

## 2020-02-19 MED ORDER — CABERGOLINE 0.5 MG PO TABS
0.2500 mg | ORAL_TABLET | ORAL | 0 refills | Status: DC
Start: 2020-02-19 — End: 2020-05-07

## 2020-02-19 NOTE — Progress Notes (Signed)
Patient ID: Wanda Douglas, female   DOB: January 24, 1971, 49 y.o.   MRN: [id number]             Chief complaint: Follow-up of high prolactin  History of Present Illness  In 1997 she was evaluated for missed menstrual cycles along with breast milk discharge She was told to have a high prolactin and a small pituitary adenoma, likely initial level was over 100 She was apparently treated with bromocriptine for several years when [place] was in [place] and this apparently was told her menstrual cycles She apparently stopped taking her bromocriptine, probably in 2017 because she was having dizziness and nausea from this     She was again advised to try this in 12/18 but could not tolerate it and treatment was stopped  Prior to her initial consultation she was having irregular menstrual cycles coming every 2 to 6 months and also milky breast discharge  RECENT history: In 2019 with a prolactin of 86 she started back on cabergoline 0.25 mg twice a week The dose was continued unchanged in 10/19 when her prolactin level had gone down to 3.8 but subsequently reduced to half tablet once a week and in 10/2026 was stopped with her prolactin being only 2.4  However she did not follow-up as scheduled Prolactin level was high in 1/21 but she did not come back for her appointment because of family issues  She has missed the last 2 menstrual cycles or they have been relatively lighter Also starting to have some with discharge from her breast recently  No significant headaches Prolactin is now 54  Prolactin levels as follows:  Lab Results  Component Value Date   PROLACTIN 93.0 (H) 02/05/2020   PROLACTIN 62.6 (H) 05/24/2019   PROLACTIN 2.4 (L) 10/19/2018   PROLACTIN 4.0 (L) 06/08/2018    MRI of pituitary gland done in 06/2017 shows 7 mm benign adenoma without any other changes   Allergies as of 02/19/2020      Reactions   Banana Hives, Shortness Of Breath   Peanut-containing Drug  Products Hives, Shortness Of Breath   All nuts (allergic) hives   Shellfish Allergy Hives, Shortness Of Breath   Chlorhexidine Hives      Medication List       Accurate as of February 19, 2020  4:20 PM. If you have any questions, ask your nurse or doctor.        albuterol 108 (90 Base) MCG/ACT inhaler Commonly known as: VENTOLIN HFA 1 to 2 puffs (5 minutes apart) every 4 hour as needed for Asthma   Azelastine HCl 137 MCG/SPRAY Soln PLACE 2 SPRAYS INTO BOTH NOSTRILS 2 (TWO) TIMES DAILY. USE IN EACH NOSTRIL AS DIRECTED   calcium carbonate 750 MG chewable tablet Commonly known as: TUMS EX Chew 2 tablets by mouth daily.   cetirizine 10 MG tablet Commonly known as: ZYRTEC Take 1 tablet (10 mg total) by mouth daily.   cholecalciferol 1000 units tablet Commonly known as: VITAMIN D Take 1,000 Units by mouth daily.   fluticasone 50 MCG/ACT nasal spray Commonly known as: FLONASE Place 1 spray into both nostrils daily for 3 days.   ibuprofen 800 MG tablet Commonly known as: ADVIL Take 1 tablet (800 mg total) by mouth 3 (three) times daily. What changed:   how to take this  when to take this  reasons to take this   multivitamin tablet Take 1 tablet by mouth daily.   phentermine 37.5 MG tablet Commonly known as:  ADIPEX-P Take 1/2 to 1 tablet every morning for dieting & weightloss       Allergies:  Allergies  Allergen Reactions   Banana Hives and Shortness Of Breath   Peanut-Containing Drug Products Hives and Shortness Of Breath    All nuts (allergic) hives    Shellfish Allergy Hives and Shortness Of Breath   Chlorhexidine Hives    Past Medical History:  Diagnosis Date   Asthma    Bloody discharge from left nipple 07/2017   Irregular menstrual cycle    Left breast mass 07/2017   Pituitary adenoma [facility])     Past Surgical History:  Procedure Laterality Date   BREAST DUCTAL SYSTEM EXCISION Left 07/26/2017   Procedure: EXCISION DUCTAL SYSTEM  BREAST;  Surgeon: Rolm Bookbinder, MD;  Location: [place];  Service: General;  Laterality: Left;   RADIOACTIVE SEED GUIDED EXCISIONAL BREAST BIOPSY Left 07/26/2017   Procedure: LEFT RADIOACTIVE SEED GUIDED EXCISIONAL BREAST BIOPSY ERAS PATHWAY;  Surgeon: Rolm Bookbinder, MD;  Location: [place];  Service: General;  Laterality: Left;   RADIOACTIVE SEED GUIDED EXCISIONAL BREAST BIOPSY Right 06/16/2018   Procedure: RADIOACTIVE SEED GUIDED EXCISIONAL RIGHT BREAST BIOPSY;  Surgeon: Rolm Bookbinder, MD;  Location: [place];  Service: General;  Laterality: Right;   right breast biopsy Right 2019   With axillary lymph node biopsy   TONSILLECTOMY      Family History  Problem Relation Age of Onset   Breast cancer Paternal Grandmother    Diabetes Paternal Grandmother    Bladder Cancer Paternal Grandmother    Hypertension Mother    Spina bifida Mother    Drug abuse Mother    Bladder Cancer Mother        smoker   Myopathy Father        Inclusion myolitis   Lung cancer Maternal Grandmother        smoker   Rheum arthritis Maternal Grandmother    Hypertension Maternal Grandfather    Diabetes Maternal Grandfather     Social History:  reports that she has never smoked. She has never used smokeless tobacco. She reports that she does not drink alcohol and does not use drugs.   Review of Systems  No history of thyroid disease:  Lab Results  Component Value Date   TSH 0.42 02/05/2020   TSH 0.77 05/24/2019   TSH 0.50 11/28/2018   FREET4 0.88 01/04/2018    She has been started on phentermine by her PCP for weight loss, blood pressure is relatively higher now  Wt Readings from Last 3 Encounters:  02/19/20 251 lb 6.4 oz (114 kg)  02/05/20 256 lb 12.8 oz (116.5 kg)  05/24/19 246 lb (111.6 kg)     BP Readings from Last 3 Encounters:  02/19/20 116/88  02/05/20 110/76  05/24/19 122/78    EXAM:  BP 116/88    Pulse  89    Ht 5\' 4"  (1.626 m)    Wt 251 lb 6.4 oz (114 kg)    SpO2 97%    BMI 43.15 kg/m   Physical Exam   ASSESSMENT:     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  She has been irregularly treated with cabergoline  Although previously her prolactin had been low normal or low even with 0.25 mg weekly now without the treatment her prolactin has been progressively higher starting 6 months after stopping this  She is also now symptomatic with amenorrhea and galactorrhea  PLAN:     She will need to go back to taking cabergoline and likely continue until she achieves menopause For now she can start back on 0.25 mg cabergoline twice a week as a baseline starting dose and follow-up in 2 months  Also discussed that she will need to review her treatment of phentermine with PCP since her pulse and blood pressure appears to be relatively higher  Elayne Snare 02/19/20   Note: This office note was prepared with Dragon voice recognition system technology. Any transcriptional errors that result from this process are unintentional.

## 2020-03-05 DIAGNOSIS — E8881 Metabolic syndrome: Secondary | ICD-10-CM | POA: Insufficient documentation

## 2020-03-05 NOTE — Progress Notes (Signed)
Assessment and Plan:  Wanda Douglas was seen today for follow-up.  Diagnoses and all orders for this visit:  Morbid obesity with BMI of 40.0-44.9, adult ([place]) Excellent progress with phentermine  Doing 1/2 tab only; might try every other day if appetite is overly suppressed - needs average minimum 1200 kcal Long discussion about weight loss, diet, and exercise Discussed final goal weight and current weight loss goal (<215 lb) Patient will work on continuing to push high fiber low processed, increase water intake and increase daily steps gradually Patient on phentermine with benefit and no SE, taking drug breaks; continue close follow up. Return in 4 months or sooner if needed. Will monitor PDMP routinely for appropriate use.  -     EKG 12-Lead  Need for influenza vaccine Quadrivalent flu vaccine administered without complication today    Further disposition pending results of labs. Discussed med's effects and SE's.   Over 30 minutes of exam, counseling, chart review, and critical decision making was performed.   Future Appointments  Date Time Provider [place]  04/15/2020  3:30 PM [facility] None  04/18/2020  3:15 PM Elayne Snare, MD [facility] None  08/06/2020  4:15 PM Liane Comber, NP [facility] None  02/04/2021  3:00 PM Liane Comber, NP [facility] None    ------------------------------------------------------------------------------------------------------------------   HPI 49 y.o.female presents for 1 month follow up on morbid obesity (BMI 40 with insulin resistance and hyperlipidemia per labs on 02/05/2020) started on phentermine.   she is newly prescribed phentermine for weight loss, doing 1/2 tab daily.  While on the medication they have lost 7 lbs since last visit. They deny palpitations, anxiety, trouble sleeping, elevated BP.   BMI is Body mass index is 42.74 kg/m., she is working on diet, has been meal planning, cooking on the weekend. Has  increased water intake to 48 fluid ounces, all soda is out of the house. Trying to do high fiber -  Wt Readings from Last 3 Encounters:  03/06/20 249 lb (112.9 kg)  02/19/20 251 lb 6.4 oz (114 kg)  02/05/20 256 lb 12.8 oz (116.5 kg)   Typical breakfast: fresh fruit, apples, orange, pomegranite, etc Typical lunch: Salad, stuffed bell pepper, etc  Typical dinner: May or may not eat, will do protein with veggie, will do brown Exercise: on feet all day, tracking steps, up to 3000-4000 steps by phone, planning on getting step counter Water intake: up to 48 fluid ounces  Today their BP is BP: 110/72  She does not workout. She denies chest pain, shortness of breath, dizziness.    Past Medical History:  Diagnosis Date   Asthma    Bloody discharge from left nipple 07/2017   Irregular menstrual cycle    Left breast mass 07/2017   Pituitary adenoma ([facility])      Allergies  Allergen Reactions   Banana Hives and Shortness Of Breath   Peanut-Containing Drug Products Hives and Shortness Of Breath    All nuts (allergic) hives    Shellfish Allergy Hives and Shortness Of Breath   Chlorhexidine Hives    Current Outpatient Medications on File Prior to Visit  Medication Sig   albuterol (VENTOLIN HFA) 108 (90 Base) MCG/ACT inhaler 1 to 2 puffs (5 minutes apart) every 4 hour as needed for Asthma   Azelastine HCl 137 MCG/SPRAY SOLN PLACE 2 SPRAYS INTO BOTH NOSTRILS 2 (TWO) TIMES DAILY. USE IN EACH NOSTRIL AS DIRECTED   cabergoline (DOSTINEX) 0.5 MG tablet Take 0.5 tablets (0.25 mg total) by mouth  2 (two) times a week.   calcium carbonate (TUMS EX) 750 MG chewable tablet Chew 2 tablets by mouth daily.   cetirizine (ZYRTEC) 10 MG tablet Take 1 tablet (10 mg total) by mouth daily.   cholecalciferol (VITAMIN D) 1000 UNITS tablet Take 1,000 Units by mouth daily.   ibuprofen (ADVIL,MOTRIN) 800 MG tablet Take 1 tablet (800 mg total) by mouth 3 (three) times daily. (Patient taking  differently: 800 mg as needed. )   Multiple Vitamin (MULTIVITAMIN) tablet Take 1 tablet by mouth daily.   phentermine (ADIPEX-P) 37.5 MG tablet Take 1/2 to 1 tablet every morning for dieting & weightloss   fluticasone (FLONASE) 50 MCG/ACT nasal spray Place 1 spray into both nostrils daily for 3 days.   No current facility-administered medications on file prior to visit.    ROS: all negative except above.   Physical Exam:  BP 110/72    Pulse 80    Temp (!) 96.4 F (35.8 C)    Wt 249 lb (112.9 kg)    SpO2 98%    BMI 42.74 kg/m   General Appearance: Well nourished, in no apparent distress. Eyes: PERRLA, EOMs, conjunctiva no swelling or erythema Sinuses: No Frontal/maxillary tenderness ENT/Mouth: Ext aud canals clear, TMs without erythema, bulging. No erythema, swelling, or exudate on post pharynx.  Tonsils not swollen or erythematous. Hearing normal.  Neck: Supple, thyroid normal.  Respiratory: Respiratory effort normal, BS equal bilaterally without rales, rhonchi, wheezing or stridor.  Cardio: RRR with no MRGs. Brisk peripheral pulses without edema.  Abdomen: Soft, + BS.  Non tender, no guarding, rebound, hernias, masses. Lymphatics: Non tender without lymphadenopathy.  Musculoskeletal: Full ROM, 5/5 strength, normal gait.  Skin: Warm, dry without rashes, lesions, ecchymosis.  Neuro: Cranial nerves intact. Normal muscle tone, no cerebellar symptoms. Sensation intact.  Psych: Awake and oriented X 3, normal affect, Insight and Judgment appropriate.     Izora Ribas, NP 4:27 PM [facility]

## 2020-03-06 ENCOUNTER — Other Ambulatory Visit: Payer: Self-pay

## 2020-03-06 ENCOUNTER — Ambulatory Visit: Payer: BLUE CROSS/BLUE SHIELD | Admitting: Adult Health

## 2020-03-06 ENCOUNTER — Encounter: Payer: Self-pay | Admitting: Adult Health

## 2020-03-06 VITALS — BP 110/72 | HR 80 | Temp 96.4°F | Wt 249.0 lb

## 2020-03-06 DIAGNOSIS — E782 Mixed hyperlipidemia: Secondary | ICD-10-CM | POA: Diagnosis not present

## 2020-03-06 DIAGNOSIS — E8881 Metabolic syndrome: Secondary | ICD-10-CM | POA: Diagnosis not present

## 2020-03-06 DIAGNOSIS — Z79899 Other long term (current) drug therapy: Secondary | ICD-10-CM

## 2020-03-06 DIAGNOSIS — Z23 Encounter for immunization: Secondary | ICD-10-CM

## 2020-03-06 DIAGNOSIS — Z6841 Body Mass Index (BMI) 40.0 and over, adult: Secondary | ICD-10-CM

## 2020-03-06 DIAGNOSIS — Z136 Encounter for screening for cardiovascular disorders: Secondary | ICD-10-CM

## 2020-03-06 NOTE — Addendum Note (Signed)
Addended by: Chancy Hurter on: 03/06/2020 05:00 PM   Modules accepted: Orders

## 2020-03-06 NOTE — Patient Instructions (Addendum)
Goals    . Exercise 150 min/wk Moderate Activity    . LDL CALC < 100    . Weight (lb) < 215 lb (97.5 kg)     Weigh weekly and keep a log for progress monitoring      Phentermine tablets or capsules What is this medicine? PHENTERMINE (FEN ter meen) decreases your appetite. It is used with a reduced calorie diet and exercise to help you lose weight. This medicine may be used for other purposes; ask your health care provider or pharmacist if you have questions. COMMON BRAND NAME(S): Adipex-P, Atti-Plex P, Atti-Plex P Spansule, Fastin, Lomaira, Pro-Fast, Tara-8 What should I tell my health care provider before I take this medicine? They need to know if you have any of these conditions:  agitation or nervousness  diabetes  glaucoma  heart disease  high blood pressure  history of drug abuse or addiction  history of stroke  kidney disease  lung disease called Primary Pulmonary Hypertension (PPH)  taken an MAOI like Carbex, Eldepryl, Marplan, Nardil, or Parnate in last 14 days  taking stimulant medicines for attention disorders, weight loss, or to stay awake  thyroid disease  an unusual or allergic reaction to phentermine, other medicines, foods, dyes, or preservatives  pregnant or trying to get pregnant  breast-feeding How should I use this medicine? Take this medicine by mouth with a glass of water. Follow the directions on the prescription label. Take your medicine at regular intervals. Do not take it more often than directed. Do not stop taking except on your doctor's advice. Talk to your pediatrician regarding the use of this medicine in children. While this drug may be prescribed for children 17 years or older for selected conditions, precautions do apply. Overdosage: If you think you have taken too much of this medicine contact a poison control center or emergency room at once. NOTE: This medicine is only for you. Do not share this medicine with others. What if I miss  a dose? If you miss a dose, take it as soon as you can. If it is almost time for your next dose, take only that dose. Do not take double or extra doses. What may interact with this medicine? Do not take this medicine with any of the following medications:  MAOIs like Carbex, Eldepryl, Marplan, Nardil, and Parnate This medicine may also interact with the following medications:  alcohol  certain medicines for depression, anxiety, or psychotic disorders  certain medicines for high blood pressure  linezolid  medicines for colds or breathing difficulties like pseudoephedrine or phenylephrine  medicines for diabetes  sibutramine  stimulant medicines for attention disorders, weight loss, or to stay awake This list may not describe all possible interactions. Give your health care provider a list of all the medicines, herbs, non-prescription drugs, or dietary supplements you use. Also tell them if you smoke, drink alcohol, or use illegal drugs. Some items may interact with your medicine. What should I watch for while using this medicine? Visit your doctor or health care provider for regular checks on your progress. Do not stop taking except on your health care provider's advice. You may develop a severe reaction. Your health care provider will tell you how much medicine to take. Do not take this medicine close to bedtime. It may prevent you from sleeping. You may get drowsy or dizzy. Do not drive, use machinery, or do anything that needs mental alertness until you know how this medicine affects you. Do not stand or  sit up quickly, especially if you are an older patient. This reduces the risk of dizzy or fainting spells. Alcohol may increase dizziness and drowsiness. Avoid alcoholic drinks. This medicine may affect blood sugar levels. Ask your healthcare provider if changes in diet or medicines are needed if you have diabetes. Women should inform their health care provider if they wish to become  pregnant or think they might be pregnant. Losing weight while pregnant is not advised and may cause harm to the unborn child. Talk to your health care provider for more information. What side effects may I notice from receiving this medicine? Side effects that you should report to your doctor or health care professional as soon as possible:  allergic reactions like skin rash, itching or hives, swelling of the face, lips, or tongue  breathing problems  changes in emotions or moods  changes in vision  chest pain or chest tightness  fast, irregular heartbeat  feeling faint or lightheaded  increased blood pressure  irritable  restlessness  tremors  seizures  signs and symptoms of a stroke like changes in vision; confusion; trouble speaking or understanding; severe headaches; sudden numbness or weakness of the face, arm or leg; trouble walking; dizziness; loss of balance or coordination  unusually weak or tired Side effects that usually do not require medical attention (report to your doctor or health care professional if they continue or are bothersome):  changes in taste  constipation or diarrhea  dizziness  dry mouth  headache  trouble sleeping  upset stomach This list may not describe all possible side effects. Call your doctor for medical advice about side effects. You may report side effects to FDA at 1-800-FDA-1088. Where should I keep my medicine? Keep out of the reach of children. This medicine can be abused. Keep your medicine in a safe place to protect it from theft. Do not share this medicine with anyone. Selling or giving away this medicine is dangerous and against the law. This medicine may cause harm and death if it is taken by other adults, children, or pets. Return medicine that has not been used to an official disposal site. Contact the DEA at (956)540-3634 or your city/county government to find a site. If you cannot return the medicine, mix any unused  medicine with a substance like cat litter or coffee grounds. Then throw the medicine away in a sealed container like a sealed bag or coffee can with a lid. Do not use the medicine after the expiration date. Store at room temperature between 20 and 25 degrees C (68 and 77 degrees F). Keep container tightly closed. NOTE: This sheet is a summary. It may not cover all possible information. If you have questions about this medicine, talk to your doctor, pharmacist, or health care provider.  2020 Elsevier/Gold Standard (2019-02-24 12:54:20)

## 2020-03-07 ENCOUNTER — Other Ambulatory Visit: Payer: Self-pay | Admitting: General Surgery

## 2020-03-07 DIAGNOSIS — D249 Benign neoplasm of unspecified breast: Secondary | ICD-10-CM

## 2020-03-27 ENCOUNTER — Other Ambulatory Visit: Payer: Self-pay

## 2020-03-27 ENCOUNTER — Ambulatory Visit
Admission: RE | Admit: 2020-03-27 | Discharge: 2020-03-27 | Disposition: A | Discharge status: Home / Self Care | Payer: BLUE CROSS/BLUE SHIELD | Source: Ambulatory Visit | Attending: General Surgery | Admitting: General Surgery

## 2020-03-27 DIAGNOSIS — D249 Benign neoplasm of unspecified breast: Secondary | ICD-10-CM

## 2020-03-27 MED ORDER — GADOBUTROL 1 MMOL/ML IV SOLN
10.0000 mL | Freq: Once | INTRAVENOUS | Status: AC | PRN
  Administered 2020-03-27: 10 mL via INTRAVENOUS

## 2020-04-05 ENCOUNTER — Other Ambulatory Visit: Payer: Self-pay | Admitting: General Surgery

## 2020-04-05 DIAGNOSIS — R9389 Abnormal findings on diagnostic imaging of other specified body structures: Secondary | ICD-10-CM

## 2020-04-15 ENCOUNTER — Other Ambulatory Visit: Payer: BLUE CROSS/BLUE SHIELD

## 2020-04-16 ENCOUNTER — Ambulatory Visit
Admission: RE | Admit: 2020-04-16 | Discharge: 2020-04-16 | Disposition: A | Discharge status: Home / Self Care | Payer: BLUE CROSS/BLUE SHIELD | Source: Ambulatory Visit | Attending: General Surgery | Admitting: General Surgery

## 2020-04-16 ENCOUNTER — Other Ambulatory Visit: Payer: Self-pay

## 2020-04-16 ENCOUNTER — Other Ambulatory Visit (HOSPITAL_COMMUNITY): Payer: Self-pay | Admitting: Diagnostic Radiology

## 2020-04-16 DIAGNOSIS — R9389 Abnormal findings on diagnostic imaging of other specified body structures: Secondary | ICD-10-CM

## 2020-04-16 MED ORDER — GADOBUTROL 1 MMOL/ML IV SOLN
10.0000 mL | Freq: Once | INTRAVENOUS | Status: AC | PRN
  Administered 2020-04-16: 10 mL via INTRAVENOUS

## 2020-04-18 ENCOUNTER — Ambulatory Visit: Payer: BLUE CROSS/BLUE SHIELD | Admitting: Endocrinology

## 2020-04-29 DIAGNOSIS — Z8616 Personal history of COVID-19: Secondary | ICD-10-CM

## 2020-04-29 HISTORY — DX: Personal history of COVID-19: Z86.16

## 2020-05-06 ENCOUNTER — Encounter: Payer: Self-pay | Admitting: Adult Health

## 2020-05-06 DIAGNOSIS — U071 COVID-19: Secondary | ICD-10-CM

## 2020-05-06 HISTORY — DX: COVID-19: U07.1

## 2020-05-07 ENCOUNTER — Other Ambulatory Visit: Payer: Self-pay | Admitting: Endocrinology

## 2020-05-09 ENCOUNTER — Other Ambulatory Visit: Payer: Self-pay | Admitting: Adult Health

## 2020-05-09 DIAGNOSIS — J45909 Unspecified asthma, uncomplicated: Secondary | ICD-10-CM

## 2020-05-09 MED ORDER — ALBUTEROL SULFATE HFA 108 (90 BASE) MCG/ACT IN AERS: INHALATION_SPRAY | RESPIRATORY_TRACT | 1 refills | Status: DC

## 2020-05-09 MED ORDER — PROMETHAZINE-DM 6.25-15 MG/5ML PO SYRP: 5.0000 mL | ORAL_SOLUTION | Freq: Four times a day (QID) | ORAL | 1 refills | Status: DC | PRN

## 2020-05-09 MED ORDER — PREDNISONE 20 MG PO TABS: ORAL_TABLET | ORAL | 0 refills | Status: DC

## 2020-05-10 ENCOUNTER — Other Ambulatory Visit: Payer: Self-pay | Admitting: General Surgery

## 2020-05-16 ENCOUNTER — Encounter (HOSPITAL_BASED_OUTPATIENT_CLINIC_OR_DEPARTMENT_OTHER): Payer: Self-pay | Admitting: General Surgery

## 2020-05-16 ENCOUNTER — Other Ambulatory Visit: Payer: Self-pay

## 2020-05-20 DIAGNOSIS — J4541 Moderate persistent asthma with (acute) exacerbation: Secondary | ICD-10-CM

## 2020-05-21 ENCOUNTER — Ambulatory Visit (HOSPITAL_COMMUNITY)
Admission: RE | Admit: 2020-05-21 | Discharge: 2020-05-21 | Disposition: A | Discharge status: Home / Self Care | Payer: BLUE CROSS/BLUE SHIELD | Source: Ambulatory Visit | Attending: Adult Health Nurse Practitioner | Admitting: Adult Health Nurse Practitioner

## 2020-05-21 ENCOUNTER — Other Ambulatory Visit: Payer: Self-pay | Admitting: Adult Health Nurse Practitioner

## 2020-05-21 ENCOUNTER — Other Ambulatory Visit: Payer: Self-pay

## 2020-05-21 ENCOUNTER — Other Ambulatory Visit (HOSPITAL_COMMUNITY): Payer: BLUE CROSS/BLUE SHIELD

## 2020-05-21 DIAGNOSIS — R062 Wheezing: Secondary | ICD-10-CM | POA: Insufficient documentation

## 2020-05-21 DIAGNOSIS — J45909 Unspecified asthma, uncomplicated: Secondary | ICD-10-CM

## 2020-05-21 DIAGNOSIS — R0602 Shortness of breath: Secondary | ICD-10-CM

## 2020-05-21 MED ORDER — ALBUTEROL SULFATE HFA 108 (90 BASE) MCG/ACT IN AERS: INHALATION_SPRAY | RESPIRATORY_TRACT | 1 refills | Status: DC

## 2020-05-21 MED ORDER — BUDESONIDE-FORMOTEROL FUMARATE 80-4.5 MCG/ACT IN AERO: 2.0000 | INHALATION_SPRAY | Freq: Two times a day (BID) | RESPIRATORY_TRACT | 12 refills | Status: DC

## 2020-05-22 ENCOUNTER — Ambulatory Visit: Payer: BLUE CROSS/BLUE SHIELD | Admitting: Adult Health Nurse Practitioner

## 2020-05-22 ENCOUNTER — Encounter: Payer: Self-pay | Admitting: Adult Health Nurse Practitioner

## 2020-05-22 DIAGNOSIS — R0602 Shortness of breath: Secondary | ICD-10-CM

## 2020-05-22 DIAGNOSIS — J45909 Unspecified asthma, uncomplicated: Secondary | ICD-10-CM

## 2020-05-22 DIAGNOSIS — R059 Cough, unspecified: Secondary | ICD-10-CM | POA: Diagnosis not present

## 2020-05-22 NOTE — Progress Notes (Incomplete)
Virtual Visit via Telephone Note  I connected with Wanda Douglas on 05/22/20 at 12:00 PM EST by telephone and verified that I am speaking with the correct person using two identifiers.   I discussed the limitations, risks, security and privacy concerns of performing an evaluation and management service by telephone and the availability of in person appointments. I also discussed with the patient that there may be a patient responsible charge related to this service. The patient expressed understanding and agreed to proceed.   Assessment and Plan:    Follow Up Instructions:    I discussed the assessment and treatment plan with the patient. The patient was provided an opportunity to ask questions and all were answered. The patient agreed with the plan and demonstrated an understanding of the instructions.   The patient was advised to call back or seek an in-person evaluation if the symptoms worsen or if the condition fails to improve as anticipated.  I provided *** minutes of non-face-to-face time during this encounter including counseling, chart review, and critical decision making was preformed.   Future Appointments  Date Time Provider [place]  08/06/2020  4:15 PM Liane Comber, NP [facility] None  02/04/2021  3:00 PM Liane Comber, NP [facility] None      History of Present Illness:  50 y.o. female presents for follow up on breathing.  She has had increase in her asthma flare. She has albuterol inhaler every 3 hours to help with her breathing.  Reports this helped but she was still struggling with her breathing.   She has had some mild coughing today that is non-productive.  She is having some mild congestion at this point.    She reports that she is having surgery tomorrow for bilateral breast mass.    05/04/20 -   Medical History:  Past Medical History:  Diagnosis Date  . Asthma   . Bloody discharge from left nipple 07/2017  . History of COVID-19  04/29/2020  . Irregular menstrual cycle   . Left breast mass 07/2017  . Pituitary adenoma ([place])     Current Medications:  Current Outpatient Medications on File Prior to Visit  Medication Sig  . albuterol (VENTOLIN HFA) 108 (90 Base) MCG/ACT inhaler 1 to 2 puffs (5 minutes apart) every 4 hour as needed for Asthma  . Azelastine HCl 137 MCG/SPRAY SOLN PLACE 2 SPRAYS INTO BOTH NOSTRILS 2 (TWO) TIMES DAILY. USE IN EACH NOSTRIL AS DIRECTED  . budesonide-formoterol (SYMBICORT) 80-4.5 MCG/ACT inhaler Inhale 2 puffs into the lungs in the morning and at bedtime. Rinse mouth after use.  . cabergoline (DOSTINEX) 0.5 MG tablet TAKE 1/2 OF A TABLET (0.25MG  TOTAL) BY MOUTH 2 TIMES A WEEK.  . calcium carbonate (TUMS EX) 750 MG chewable tablet Chew 2 tablets by mouth daily.  . cetirizine (ZYRTEC) 10 MG tablet Take 1 tablet (10 mg total) by mouth daily.  . cholecalciferol (VITAMIN D) 1000 UNITS tablet Take 1,000 Units by mouth daily.  Marland Kitchen ibuprofen (ADVIL,MOTRIN) 800 MG tablet Take 1 tablet (800 mg total) by mouth 3 (three) times daily. (Patient taking differently: 800 mg as needed.)  . Multiple Vitamin (MULTIVITAMIN) tablet Take 1 tablet by mouth daily.  . phentermine (ADIPEX-P) 37.5 MG tablet Take 1/2 to 1 tablet every morning for dieting & weightloss  . fluticasone (FLONASE) 50 MCG/ACT nasal spray Place 1 spray into both nostrils daily for 3 days.   No current facility-administered medications on file prior to visit.    Allergies:  Allergies  Allergen Reactions  . Banana Hives and Shortness Of Breath  . Peanut-Containing Drug Products Hives and Shortness Of Breath    All nuts (allergic) hives   . Shellfish Allergy Hives and Shortness Of Breath  . Chlorhexidine Hives      Review of Systems:  ROS   Observations/Objective: General : Well sounding patient in no apparent distress HEENT: no hoarseness, no cough for duration of visit Lungs: speaks in complete sentences, no audible wheezing, no  apparent distress Neurological: alert, oriented x 3 Psychiatric: pleasant, judgement appropriate      Garnet Sierras, NP [facility] 05/22/2020  12:11 PM

## 2020-05-22 NOTE — Progress Notes (Signed)
Have not received documentation from patient for positive covid test result. Called patient who said she will email to me. Email sent to her for her to respond to.

## 2020-05-23 ENCOUNTER — Ambulatory Visit (HOSPITAL_BASED_OUTPATIENT_CLINIC_OR_DEPARTMENT_OTHER)
Admission: RE | Admit: 2020-05-23 | Discharge: 2020-05-23 | Disposition: A | Discharge status: Home / Self Care | Payer: BLUE CROSS/BLUE SHIELD | Attending: General Surgery | Admitting: General Surgery

## 2020-05-23 ENCOUNTER — Encounter: Payer: Self-pay | Admitting: Adult Health Nurse Practitioner

## 2020-05-23 ENCOUNTER — Ambulatory Visit (HOSPITAL_BASED_OUTPATIENT_CLINIC_OR_DEPARTMENT_OTHER): Payer: BLUE CROSS/BLUE SHIELD | Admitting: Certified Registered Nurse Anesthetist

## 2020-05-23 ENCOUNTER — Encounter (HOSPITAL_BASED_OUTPATIENT_CLINIC_OR_DEPARTMENT_OTHER)
Admission: RE | Disposition: A | Discharge status: Home / Self Care | Payer: Self-pay | Source: Home / Self Care | Attending: General Surgery

## 2020-05-23 ENCOUNTER — Other Ambulatory Visit: Payer: Self-pay

## 2020-05-23 ENCOUNTER — Encounter (HOSPITAL_BASED_OUTPATIENT_CLINIC_OR_DEPARTMENT_OTHER): Payer: Self-pay | Admitting: General Surgery

## 2020-05-23 DIAGNOSIS — Z803 Family history of malignant neoplasm of breast: Secondary | ICD-10-CM | POA: Diagnosis not present

## 2020-05-23 DIAGNOSIS — N6041 Mammary duct ectasia of right breast: Secondary | ICD-10-CM | POA: Diagnosis not present

## 2020-05-23 DIAGNOSIS — Z801 Family history of malignant neoplasm of trachea, bronchus and lung: Secondary | ICD-10-CM | POA: Diagnosis not present

## 2020-05-23 DIAGNOSIS — Z91013 Allergy to seafood: Secondary | ICD-10-CM | POA: Insufficient documentation

## 2020-05-23 DIAGNOSIS — Z833 Family history of diabetes mellitus: Secondary | ICD-10-CM | POA: Diagnosis not present

## 2020-05-23 DIAGNOSIS — Z79899 Other long term (current) drug therapy: Secondary | ICD-10-CM | POA: Insufficient documentation

## 2020-05-23 DIAGNOSIS — N6042 Mammary duct ectasia of left breast: Secondary | ICD-10-CM | POA: Insufficient documentation

## 2020-05-23 DIAGNOSIS — Z853 Personal history of malignant neoplasm of breast: Secondary | ICD-10-CM | POA: Diagnosis not present

## 2020-05-23 DIAGNOSIS — Z8249 Family history of ischemic heart disease and other diseases of the circulatory system: Secondary | ICD-10-CM | POA: Insufficient documentation

## 2020-05-23 DIAGNOSIS — Z888 Allergy status to other drugs, medicaments and biological substances status: Secondary | ICD-10-CM | POA: Diagnosis not present

## 2020-05-23 DIAGNOSIS — N6452 Nipple discharge: Secondary | ICD-10-CM | POA: Diagnosis present

## 2020-05-23 DIAGNOSIS — Z791 Long term (current) use of non-steroidal anti-inflammatories (NSAID): Secondary | ICD-10-CM | POA: Insufficient documentation

## 2020-05-23 DIAGNOSIS — Z91010 Allergy to peanuts: Secondary | ICD-10-CM | POA: Insufficient documentation

## 2020-05-23 DIAGNOSIS — Z91018 Allergy to other foods: Secondary | ICD-10-CM | POA: Diagnosis not present

## 2020-05-23 DIAGNOSIS — Z8269 Family history of other diseases of the musculoskeletal system and connective tissue: Secondary | ICD-10-CM | POA: Insufficient documentation

## 2020-05-23 DIAGNOSIS — Z8616 Personal history of COVID-19: Secondary | ICD-10-CM | POA: Insufficient documentation

## 2020-05-23 DIAGNOSIS — D242 Benign neoplasm of left breast: Secondary | ICD-10-CM | POA: Insufficient documentation

## 2020-05-23 DIAGNOSIS — Z8052 Family history of malignant neoplasm of bladder: Secondary | ICD-10-CM | POA: Diagnosis not present

## 2020-05-23 DIAGNOSIS — Z8261 Family history of arthritis: Secondary | ICD-10-CM | POA: Insufficient documentation

## 2020-05-23 DIAGNOSIS — D241 Benign neoplasm of right breast: Secondary | ICD-10-CM | POA: Diagnosis not present

## 2020-05-23 DIAGNOSIS — Z7951 Long term (current) use of inhaled steroids: Secondary | ICD-10-CM | POA: Diagnosis not present

## 2020-05-23 HISTORY — PX: BREAST DUCTAL SYSTEM EXCISION: SHX5242

## 2020-05-23 LAB — POCT PREGNANCY, URINE: Preg Test, Ur: NEGATIVE

## 2020-05-23 SURGERY — EXCISION DUCTAL SYSTEM BREAST
Anesthesia: General | Site: Breast | Laterality: Bilateral

## 2020-05-23 MED ORDER — LIDOCAINE HCL (CARDIAC) PF 100 MG/5ML IV SOSY
PREFILLED_SYRINGE | INTRAVENOUS | Status: DC | PRN
  Administered 2020-05-23: 60 mg via INTRAVENOUS

## 2020-05-23 MED ORDER — DEXAMETHASONE SODIUM PHOSPHATE 10 MG/ML IJ SOLN
INTRAMUSCULAR | Status: AC
  Filled 2020-05-23: qty 1

## 2020-05-23 MED ORDER — CEFAZOLIN SODIUM-DEXTROSE 2-3 GM-%(50ML) IV SOLR
INTRAVENOUS | Status: DC | PRN
  Administered 2020-05-23: 2 g via INTRAVENOUS

## 2020-05-23 MED ORDER — ONDANSETRON HCL 4 MG/2ML IJ SOLN
INTRAMUSCULAR | Status: DC | PRN
  Administered 2020-05-23: 4 mg via INTRAVENOUS

## 2020-05-23 MED ORDER — LACTATED RINGERS IV SOLN: INTRAVENOUS | Status: DC | PRN

## 2020-05-23 MED ORDER — LACTATED RINGERS IV SOLN: INTRAVENOUS | Status: DC

## 2020-05-23 MED ORDER — BUPIVACAINE HCL (PF) 0.25 % IJ SOLN
INTRAMUSCULAR | Status: DC | PRN
  Administered 2020-05-23: 18 mL

## 2020-05-23 MED ORDER — FENTANYL CITRATE (PF) 100 MCG/2ML IJ SOLN
INTRAMUSCULAR | Status: DC | PRN
  Administered 2020-05-23 (×3): 50 ug via INTRAVENOUS
  Administered 2020-05-23 (×2): 25 ug via INTRAVENOUS

## 2020-05-23 MED ORDER — PHENYLEPHRINE HCL (PRESSORS) 10 MG/ML IV SOLN
INTRAVENOUS | Status: DC | PRN
  Administered 2020-05-23: 80 ug via INTRAVENOUS
  Administered 2020-05-23: 200 ug via INTRAVENOUS

## 2020-05-23 MED ORDER — 0.9 % SODIUM CHLORIDE (POUR BTL) OPTIME
TOPICAL | Status: DC | PRN
  Administered 2020-05-23: 50 mL

## 2020-05-23 MED ORDER — AMISULPRIDE (ANTIEMETIC) 5 MG/2ML IV SOLN: 10.0000 mg | Freq: Once | INTRAVENOUS | Status: DC | PRN

## 2020-05-23 MED ORDER — CEFAZOLIN SODIUM-DEXTROSE 2-4 GM/100ML-% IV SOLN
INTRAVENOUS | Status: AC
  Filled 2020-05-23: qty 100

## 2020-05-23 MED ORDER — ONDANSETRON HCL 4 MG/2ML IJ SOLN
INTRAMUSCULAR | Status: AC
  Filled 2020-05-23: qty 2

## 2020-05-23 MED ORDER — PROPOFOL 10 MG/ML IV BOLUS
INTRAVENOUS | Status: DC | PRN
  Administered 2020-05-23: 200 mg via INTRAVENOUS

## 2020-05-23 MED ORDER — FENTANYL CITRATE (PF) 100 MCG/2ML IJ SOLN: 25.0000 ug | INTRAMUSCULAR | Status: DC | PRN

## 2020-05-23 MED ORDER — TRAMADOL HCL 50 MG PO TABS: 50.0000 mg | ORAL_TABLET | Freq: Four times a day (QID) | ORAL | 0 refills | Status: DC | PRN

## 2020-05-23 MED ORDER — CEFAZOLIN SODIUM-DEXTROSE 2-4 GM/100ML-% IV SOLN: 2.0000 g | INTRAVENOUS | Status: DC

## 2020-05-23 MED ORDER — LIDOCAINE 2% (20 MG/ML) 5 ML SYRINGE
INTRAMUSCULAR | Status: AC
  Filled 2020-05-23: qty 5

## 2020-05-23 MED ORDER — DEXAMETHASONE SODIUM PHOSPHATE 10 MG/ML IJ SOLN
INTRAMUSCULAR | Status: DC | PRN
  Administered 2020-05-23: 5 mg via INTRAVENOUS

## 2020-05-23 MED ORDER — PROPOFOL 10 MG/ML IV BOLUS
INTRAVENOUS | Status: AC
  Filled 2020-05-23: qty 40

## 2020-05-23 MED ORDER — FENTANYL CITRATE (PF) 100 MCG/2ML IJ SOLN
INTRAMUSCULAR | Status: AC
  Filled 2020-05-23: qty 2

## 2020-05-23 SURGICAL SUPPLY — 49 items
ADH SKN CLS APL DERMABOND .7 (GAUZE/BANDAGES/DRESSINGS)
APL PRP STRL LF DISP 70% ISPRP (MISCELLANEOUS) ×1
BINDER BREAST LRG (GAUZE/BANDAGES/DRESSINGS) IMPLANT
BINDER BREAST MEDIUM (GAUZE/BANDAGES/DRESSINGS) IMPLANT
BINDER BREAST XLRG (GAUZE/BANDAGES/DRESSINGS) IMPLANT
BINDER BREAST XXLRG (GAUZE/BANDAGES/DRESSINGS) ×1 IMPLANT
BLADE SURG 15 STRL LF DISP TIS (BLADE) ×1 IMPLANT
BLADE SURG 15 STRL SS (BLADE) ×2
CANISTER SUCT 1200ML W/VALVE (MISCELLANEOUS) IMPLANT
CHLORAPREP W/TINT 26 (MISCELLANEOUS) ×2 IMPLANT
CLIP VESOCCLUDE SM WIDE 6/CT (CLIP) IMPLANT
COVER BACK TABLE 60X90IN (DRAPES) ×2 IMPLANT
COVER MAYO STAND STRL (DRAPES) ×2 IMPLANT
COVER WAND RF STERILE (DRAPES) IMPLANT
DECANTER SPIKE VIAL GLASS SM (MISCELLANEOUS) IMPLANT
DERMABOND ADVANCED (GAUZE/BANDAGES/DRESSINGS)
DERMABOND ADVANCED .7 DNX12 (GAUZE/BANDAGES/DRESSINGS) IMPLANT
DRAPE LAPAROSCOPIC ABDOMINAL (DRAPES) ×2 IMPLANT
DRAPE UTILITY XL STRL (DRAPES) ×2 IMPLANT
DRSG TEGADERM 4X4.75 (GAUZE/BANDAGES/DRESSINGS) ×2 IMPLANT
ELECT COATED BLADE 2.86 ST (ELECTRODE) ×2 IMPLANT
ELECT REM PT RETURN 9FT ADLT (ELECTROSURGICAL) ×2
ELECTRODE REM PT RTRN 9FT ADLT (ELECTROSURGICAL) ×1 IMPLANT
GAUZE SPONGE 4X4 12PLY STRL LF (GAUZE/BANDAGES/DRESSINGS) ×2 IMPLANT
GLOVE SURG ENC MOIS LTX SZ7 (GLOVE) ×2 IMPLANT
GLOVE SURG UNDER POLY LF SZ7.5 (GLOVE) ×2 IMPLANT
GOWN STRL REUS W/ TWL LRG LVL3 (GOWN DISPOSABLE) ×3 IMPLANT
GOWN STRL REUS W/TWL LRG LVL3 (GOWN DISPOSABLE) ×6
NDL HYPO 25X1 1.5 SAFETY (NEEDLE) ×1 IMPLANT
NEEDLE HYPO 25X1 1.5 SAFETY (NEEDLE) ×2 IMPLANT
NS IRRIG 1000ML POUR BTL (IV SOLUTION) IMPLANT
PACK BASIN DAY SURGERY FS (CUSTOM PROCEDURE TRAY) ×2 IMPLANT
PENCIL SMOKE EVACUATOR (MISCELLANEOUS) ×2 IMPLANT
RETRACTOR ONETRAX LX 90X20 (MISCELLANEOUS) IMPLANT
SLEEVE SCD COMPRESS KNEE MED (MISCELLANEOUS) ×2 IMPLANT
SPONGE LAP 4X18 RFD (DISPOSABLE) ×2 IMPLANT
STRIP CLOSURE SKIN 1/2X4 (GAUZE/BANDAGES/DRESSINGS) IMPLANT
SUT MNCRL AB 4-0 PS2 18 (SUTURE) IMPLANT
SUT MON AB 5-0 PS2 18 (SUTURE) ×2 IMPLANT
SUT SILK 2 0 SH (SUTURE) ×2 IMPLANT
SUT VIC AB 2-0 SH 27 (SUTURE) ×8
SUT VIC AB 2-0 SH 27XBRD (SUTURE) ×1 IMPLANT
SUT VIC AB 3-0 SH 27 (SUTURE) ×4
SUT VIC AB 3-0 SH 27X BRD (SUTURE) ×1 IMPLANT
SUT VIC AB 5-0 PS2 18 (SUTURE) IMPLANT
SYR CONTROL 10ML LL (SYRINGE) ×2 IMPLANT
TOWEL GREEN STERILE FF (TOWEL DISPOSABLE) ×2 IMPLANT
TUBE CONNECTING 20X1/4 (TUBING) IMPLANT
YANKAUER SUCT BULB TIP NO VENT (SUCTIONS) IMPLANT

## 2020-05-23 NOTE — Transfer of Care (Signed)
Immediate Anesthesia Transfer of Care Note  Patient: Wanda Douglas  Procedure(s) Performed: BIL BREAST CENTRAL DUCT EXCISION (Bilateral Breast)  Patient Location: PACU  Anesthesia Type:General  Level of Consciousness: awake, alert  and oriented  Airway & Oxygen Therapy: Patient Spontanous Breathing and Patient connected to face mask oxygen  Post-op Assessment: Report given to RN and Post -op Vital signs reviewed and stable  Post vital signs: Reviewed and stable  Last Vitals:  Vitals Value Taken Time  BP    Temp    Pulse 88 05/23/20 1143  Resp    SpO2 99 % 05/23/20 1143  Vitals shown include unvalidated device data.  Last Pain:  Vitals:   05/23/20 1014  TempSrc: Oral  PainSc: 0-No pain         Complications: No complications documented.

## 2020-05-23 NOTE — H&P (Signed)
Wanda Douglas is an 50 y.o. female.   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 also had left breast papilloma excision in 2019. she had some bloody left nipple dc. underwent mm/us that shows two indeterminate masses in subareolar left breast. biopsy shows fragments suggestive of possible papilloma and benign tissue discordant. she also has had mri that shows no abnormal nodes, indeterminate nme about 2.1 cm involving right UIQ, no residual enhancement of the left breast. 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 this is concordant. she returns today and is continuing to have bloody left nipple dc after it had stopped before. she now also has right bloody nipple dc again and we dsicussed excising this also.      Past Medical History:  Diagnosis Date  . Asthma   . Bloody discharge from left nipple 07/2017  . History of COVID-19 04/29/2020  . Irregular menstrual cycle   . Left breast mass 07/2017  . Pituitary adenoma [facility])     Past Surgical History:  Procedure Laterality Date  . BREAST DUCTAL SYSTEM EXCISION Left 07/26/2017   Procedure: EXCISION DUCTAL SYSTEM BREAST;  Surgeon: Rolm Bookbinder, MD;  Location: [place];  Service: General;  Laterality: Left;  . RADIOACTIVE SEED GUIDED EXCISIONAL BREAST BIOPSY Left 07/26/2017   Procedure: LEFT RADIOACTIVE SEED GUIDED EXCISIONAL BREAST BIOPSY ERAS PATHWAY;  Surgeon: Rolm Bookbinder, MD;  Location: [place];  Service: General;  Laterality: Left;  . RADIOACTIVE SEED GUIDED EXCISIONAL BREAST BIOPSY Right 06/16/2018   Procedure: RADIOACTIVE SEED GUIDED EXCISIONAL RIGHT BREAST BIOPSY;  Surgeon: Rolm Bookbinder, MD;  Location: [place];  Service: General;  Laterality: Right;  . right breast biopsy Right 2019   With axillary lymph node biopsy  . TONSILLECTOMY      Family History  Problem  Relation Age of Onset  . Breast cancer Paternal Grandmother   . Diabetes Paternal Grandmother   . Bladder Cancer Paternal Grandmother   . Hypertension Mother   . Spina bifida Mother   . Drug abuse Mother   . Bladder Cancer Mother        smoker  . Myopathy Father        Inclusion myolitis  . Lung cancer Maternal Grandmother        smoker  . Rheum arthritis Maternal Grandmother   . Hypertension Maternal Grandfather   . Diabetes Maternal Grandfather    Social History:  reports that she has never smoked. She has never used smokeless tobacco. She reports that she does not drink alcohol and does not use drugs.  Allergies:  Allergies  Allergen Reactions  . Banana Hives and Shortness Of Breath  . Peanut-Containing Drug Products Hives and Shortness Of Breath    All nuts (allergic) hives   . Shellfish Allergy Hives and Shortness Of Breath  . Chlorhexidine Hives    Medications Prior to Admission  Medication Sig Dispense Refill  . albuterol (VENTOLIN HFA) 108 (90 Base) MCG/ACT inhaler 1 to 2 puffs (5 minutes apart) every 4 hour as needed for Asthma 18 g 1  . Azelastine HCl 137 MCG/SPRAY SOLN PLACE 2 SPRAYS INTO BOTH NOSTRILS 2 (TWO) TIMES DAILY. USE IN EACH NOSTRIL AS DIRECTED 30 mL 1  . budesonide-formoterol (SYMBICORT) 80-4.5 MCG/ACT inhaler Inhale 2 puffs into the lungs in the morning and at bedtime. Rinse mouth after use. 1 each 12  .  cabergoline (DOSTINEX) 0.5 MG tablet TAKE 1/2 OF A TABLET (0.25MG  TOTAL) BY MOUTH 2 TIMES A WEEK. 10 tablet 0  . calcium carbonate (TUMS EX) 750 MG chewable tablet Chew 2 tablets by mouth daily.    . cetirizine (ZYRTEC) 10 MG tablet Take 1 tablet (10 mg total) by mouth daily. 90 tablet 1  . cholecalciferol (VITAMIN D) 1000 UNITS tablet Take 1,000 Units by mouth daily.    Marland Kitchen ibuprofen (ADVIL,MOTRIN) 800 MG tablet Take 1 tablet (800 mg total) by mouth 3 (three) times daily. (Patient taking differently: 800 mg as needed.) 21 tablet 0  . Multiple Vitamin  (MULTIVITAMIN) tablet Take 1 tablet by mouth daily.    . phentermine (ADIPEX-P) 37.5 MG tablet Take 1/2 to 1 tablet every morning for dieting & weightloss 30 tablet 0  . fluticasone (FLONASE) 50 MCG/ACT nasal spray Place 1 spray into both nostrils daily for 3 days. 1 g 0    Results for orders placed or performed during the hospital encounter of 05/23/20 (from the past 48 hour(s))  Pregnancy, urine POC     Status: None   Collection Time: 05/23/20  9:58 AM  Result Value Ref Range   Preg Test, Ur NEGATIVE NEGATIVE    Comment:        THE SENSITIVITY OF THIS METHODOLOGY IS >24 mIU/mL    DG Chest 2 View  Result Date: 05/21/2020 CLINICAL DATA:  Asthma has been acting up recently. Also recent COVID+, wheezing, SOB, coughing. Hx of breast ca in 2019. EXAM: CHEST - 2 VIEW COMPARISON:  09/03/2018 FINDINGS: No significant airway thickening is identified. The lungs appear clear. Cardiac and mediastinal margins appear normal. No blunting of the costophrenic angles. No significant bony abnormality observed. IMPRESSION: 1. No significant abnormality identified. Electronically Signed   By: Van Clines M.D.   On: 05/21/2020 15:47    Review of Systems  All other systems reviewed and are negative.   Blood pressure 120/72, pulse 94, temperature 98.7 F (37.1 C), temperature source Oral, resp. rate 20, height 5\' 4"  (1.626 m), weight 112.1 kg, last menstrual period 04/28/2020, SpO2 98 %. Physical Exam  General Mental Status-Alert. Orientation-Oriented X3. Breast Nipples-No Discharge. Breast Lump-No Palpable Breast Mass.  Lymphatic Head & Neck  General Head & Neck Lymphatics: Bilateral - Description - Normal. Axillary  General Axillary Region: Bilateral - Description - Normal. Note: no [place] adenopathy  Assessment/Plan BLOODY DISCHARGE FROM left and right NIPPLEs Right and Left central duct excision with continued bloody discharge and the discordant biopsy with papilloma I think a  central duct excision is indicated. discussed surgery, risks and recovery will proceed.  Rolm Bookbinder, MD 05/23/2020, 10:22 AM

## 2020-05-23 NOTE — Progress Notes (Signed)
Virtual Visit via Telephone Note  I connected with Wanda Douglas on 05/23/20 at 12:00 PM EST by telephone and verified that I am speaking with the correct person using two identifiers.   I discussed the limitations, risks, security and privacy concerns of performing an evaluation and management service by telephone and the availability of in person appointments. I also discussed with the patient that there may be a patient responsible charge related to this service. The patient expressed understanding and agreed to proceed.   Assessment and Plan:  Wanda Douglas was seen today for discuss chest xray.  Diagnoses and all orders for this visit:  Cough Continue MucinexDM  Shortness of breath Continue Albuterol Q4 PRN Chest X-ray - unremarkable  Intrinsic asthma Rx Symbicort, BID, rinse mouth after use May need to continue maintenance inhaler since lung compromise with COVID19. Discussed concern of surgery related to difficulty breathing. Consider PFT's?    Follow Up Instructions:    I discussed the assessment and treatment plan with the patient. The patient was provided an opportunity to ask questions and all were answered. The patient agreed with the plan and demonstrated an understanding of the instructions.   The patient was advised to call back or seek an in-person evaluation if the symptoms worsen or if the condition fails to improve as anticipated.  I provided 30 minutes of non-face-to-face time during this encounter including counseling, chart review, and critical decision making was preformed.   Future Appointments  Date Time Provider [place]  08/06/2020  4:15 PM Liane Comber, NP [facility] None  02/04/2021  3:00 PM Liane Comber, NP [facility] None      History of Present Illness:  50 y.o. female presents for follow up on breathing.  She has had increase in her asthma flare. She has albuterol inhaler every 3 hours to help with her breathing.   Reports this helped but she was still struggling with her breathing.   She has had some mild coughing today that is non-productive.  She is having some mild congestion at this point.    She reports that she is having surgery tomorrow for bilateral breast mass.    05/04/20 - She tested pos for COVID 19, she received supportive treatment recommendations through our office.  Contacted six days later with worsening cough, prednisone taper and refill for albuterol.   Medical History:  Past Medical History:  Diagnosis Date  . Asthma   . Bloody discharge from left nipple 07/2017  . History of COVID-19 04/29/2020  . Irregular menstrual cycle   . Left breast mass 07/2017  . Pituitary adenoma ([place])     Current Medications:  Current Outpatient Medications on File Prior to Visit  Medication Sig  . albuterol (VENTOLIN HFA) 108 (90 Base) MCG/ACT inhaler 1 to 2 puffs (5 minutes apart) every 4 hour as needed for Asthma  . Azelastine HCl 137 MCG/SPRAY SOLN PLACE 2 SPRAYS INTO BOTH NOSTRILS 2 (TWO) TIMES DAILY. USE IN EACH NOSTRIL AS DIRECTED  . budesonide-formoterol (SYMBICORT) 80-4.5 MCG/ACT inhaler Inhale 2 puffs into the lungs in the morning and at bedtime. Rinse mouth after use.  . cabergoline (DOSTINEX) 0.5 MG tablet TAKE 1/2 OF A TABLET (0.25MG  TOTAL) BY MOUTH 2 TIMES A WEEK.  . calcium carbonate (TUMS EX) 750 MG chewable tablet Chew 2 tablets by mouth daily.  . cetirizine (ZYRTEC) 10 MG tablet Take 1 tablet (10 mg total) by mouth daily.  . cholecalciferol (VITAMIN D) 1000 UNITS tablet Take 1,000 Units by mouth  daily.  . ibuprofen (ADVIL,MOTRIN) 800 MG tablet Take 1 tablet (800 mg total) by mouth 3 (three) times daily. (Patient taking differently: 800 mg as needed.)  . Multiple Vitamin (MULTIVITAMIN) tablet Take 1 tablet by mouth daily.  . phentermine (ADIPEX-P) 37.5 MG tablet Take 1/2 to 1 tablet every morning for dieting & weightloss  . fluticasone (FLONASE) 50 MCG/ACT nasal spray Place 1 spray  into both nostrils daily for 3 days.  . traMADol (ULTRAM) 50 MG tablet Take 1 tablet (50 mg total) by mouth every 6 (six) hours as needed.   Current Facility-Administered Medications on File Prior to Visit  Medication  . amisulpride (BARHEMSYS) injection 10 mg  . ceFAZolin (ANCEF) IVPB 2g/100 mL premix  . fentaNYL (SUBLIMAZE) injection 25-50 mcg  . lactated ringers infusion    Allergies:  Allergies  Allergen Reactions  . Banana Hives and Shortness Of Breath  . Peanut-Containing Drug Products Hives and Shortness Of Breath    All nuts (allergic) hives   . Shellfish Allergy Hives and Shortness Of Breath  . Chlorhexidine Hives      Review of Systems:  Review of Systems  Constitutional: Negative for chills, diaphoresis, fever, malaise/fatigue and weight loss.  HENT: Negative for congestion, ear pain, hearing loss, sinus pain, sore throat and tinnitus.   Eyes: Negative for blurred vision and double vision.  Respiratory: Positive for cough, sputum production and shortness of breath. Negative for wheezing.   Cardiovascular: Negative for chest pain, palpitations, orthopnea, claudication, leg swelling and PND.  Gastrointestinal: Negative for heartburn, nausea and vomiting.  Skin: Negative for itching and rash.  Neurological: Negative for dizziness and headaches.      Observations/Objective: General : Well sounding patient in no apparent distress HEENT: no hoarseness, no cough for duration of visit Lungs: speaks in complete sentences, no audible wheezing, no apparent distress Neurological: alert, oriented x 3 Psychiatric: pleasant, judgement appropriate      Garnet Sierras, NP [facility] 05/22/2020  1:54 PM

## 2020-05-23 NOTE — Anesthesia Postprocedure Evaluation (Signed)
Anesthesia Post Note  Patient: Wanda Douglas  Procedure(s) Performed: BIL BREAST CENTRAL DUCT EXCISION (Bilateral Breast)     Patient location during evaluation: PACU Anesthesia Type: General Level of consciousness: awake and alert Pain management: pain level controlled Vital Signs Assessment: post-procedure vital signs reviewed and stable Respiratory status: spontaneous breathing, nonlabored ventilation, respiratory function stable and patient connected to nasal cannula oxygen Cardiovascular status: blood pressure returned to baseline and stable Postop Assessment: no apparent nausea or vomiting Anesthetic complications: no   No complications documented.  Last Vitals:  Vitals:   05/23/20 1210 05/23/20 1224  BP: 116/80 121/83  Pulse: 89 81  Resp: 20 19  Temp:  36.6 C  SpO2: 99% 98%    Last Pain:  Vitals:   05/23/20 1224  TempSrc:   PainSc: 0-No pain                 Tiajuana Amass

## 2020-05-23 NOTE — Anesthesia Procedure Notes (Signed)
Procedure Name: LMA Insertion Performed by: Verita Lamb, CRNA Pre-anesthesia Checklist: Patient identified, Emergency Drugs available, Suction available and Patient being monitored Patient Re-evaluated:Patient Re-evaluated prior to induction Oxygen Delivery Method: Circle system utilized Preoxygenation: Pre-oxygenation with 100% oxygen Induction Type: IV induction Ventilation: Mask ventilation without difficulty LMA: LMA inserted LMA Size: 4.0 Number of attempts: 1 Airway Equipment and Method: Bite block Placement Confirmation: positive ETCO2,  CO2 detector and breath sounds checked- equal and bilateral Tube secured with: Tape Dental Injury: Teeth and Oropharynx as per pre-operative assessment

## 2020-05-23 NOTE — Op Note (Signed)
Preoperative diagnosis: Bilateral bloody nipple discharge Postoperative diagnosis: Same as above Procedure: Bilateral central duct excisions Surgeon: Dr. Serita Grammes Anesthesia: General Estimated blood loss: Minimal Complications: None Drains: None Specimens: Right and left central duct excisions marked with paint Sponge and needle count was correct at completion Disposition to recovery stable  Indications: 64 yof s/p excision of central ducts on right on 06/16/18 for a papilloma. She has history of galactorrhea. also had left breast papilloma excision in 2019. she had some bloody left nipple dc. underwent mm/us that shows two indeterminate masses in subareolar left breast. biopsy shows fragments suggestive of possible papilloma and benign tissue discordant. she also has had mri that shows no abnormal nodes, indeterminate nme about 2.1 cm involving right UIQ, no residual enhancement of the left breast. 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 this is concordant. she returns today and is continuing to have bloody left nipple dc after it had stopped before. she now also has right bloody nipple dc again and we discussed excising this also.    Procedure: After informed consent was obtained the patient was taken the operating room. She was then placed under general anesthesia.  She was given antibiotics.  SCDs were in place.  Her breasts were prepped with duraprep which she tolerated fine and draped Surgical timeout was then performed.  I first did the left side. I identified the discharging ductal system and this was bloody. I cannulated it with a lacrimal duct probe. I then infiltrated marcaine throughout and used her prior periareolar incision.  I removed the central ductal system ensuring that the offending system with the lacrimal duct probe was removed also.   I closed the tissue with 2-0 Vicryl.  The skin was closed with 3-0 Vicryl and 5-0 Monocryl.    I then did the  right side I identified the discharging ductal system and this was brown. I cannulated it with a lacrimal duct probe. I then infiltrated marcaine throughout and used her prior inferior periareolar incision.  I removed the central ductal system ensuring that the offending system with the lacrimal duct probe was removed also.   I closed the tissue with 2-0 Vicryl.  The skin was closed with 3-0 Vicryl and 5-0 Monocryl.  Glue and Steri-Strips were applied.  She tolerated this well was extubated and transferred to recovery stable.

## 2020-05-23 NOTE — Anesthesia Preprocedure Evaluation (Signed)
Anesthesia Evaluation  Patient identified by MRN, date of birth, ID band Patient awake    Reviewed: Allergy & Precautions, NPO status , Patient's Chart, lab work & pertinent test results  Airway Mallampati: II  TM Distance: >3 FB Neck ROM: Full    Dental  (+) Dental Advisory Given   Pulmonary asthma ,    breath sounds clear to auscultation       Cardiovascular negative cardio ROS   Rhythm:Regular Rate:Normal     Neuro/Psych negative neurological ROS     GI/Hepatic negative GI ROS, Neg liver ROS,   Endo/Other  Morbid obesity  Renal/GU negative Renal ROS     Musculoskeletal   Abdominal   Peds  Hematology negative hematology ROS (+)   Anesthesia Other Findings   Reproductive/Obstetrics                             Anesthesia Physical Anesthesia Plan  ASA: III  Anesthesia Plan: General   Post-op Pain Management:    Induction: Intravenous  PONV Risk Score and Plan: 3 and Midazolam, Dexamethasone, Ondansetron and Treatment may vary due to age or medical condition  Airway Management Planned: LMA  Additional Equipment:   Intra-op Plan:   Post-operative Plan: Extubation in OR  Informed Consent: I have reviewed the patients History and Physical, chart, labs and discussed the procedure including the risks, benefits and alternatives for the proposed anesthesia with the patient or authorized representative who has indicated his/her understanding and acceptance.     Dental advisory given  Plan Discussed with: CRNA  Anesthesia Plan Comments:         Anesthesia Quick Evaluation

## 2020-05-23 NOTE — Interval H&P Note (Signed)
History and Physical Interval Note:  05/23/2020 10:24 AM  Wanda Douglas  has presented today for surgery, with the diagnosis of BLOODY NIPPLE DISCHARGE.  The various methods of treatment have been discussed with the patient and family. After consideration of risks, benefits and other options for treatment, the patient has consented to  Procedure(s): BIL BREAST CENTRAL DUCT EXCISION (Bilateral) as a surgical intervention.  The patient's history has been reviewed, patient examined, no change in status, stable for surgery.  I have reviewed the patient's chart and labs.  Questions were answered to the patient's satisfaction.     Rolm Bookbinder

## 2020-05-23 NOTE — Discharge Instructions (Signed)
[facility] Office Phone Number [phone]  BREAST BIOPSY/ PARTIAL MASTECTOMY: POST OP INSTRUCTIONS Take 400 mg of ibuprofen every 8 hours or 650 mg tylenol every 6 hours for next 72 hours then as needed. Use ice several times daily also. Always review your discharge instruction sheet given to you by the facility where your surgery was performed.  IF YOU HAVE DISABILITY OR FAMILY LEAVE FORMS, YOU MUST BRING THEM TO THE OFFICE FOR PROCESSING.  DO NOT GIVE THEM TO YOUR DOCTOR.  1. A prescription for pain medication may be given to you upon discharge.  Take your pain medication as prescribed, if needed.  If narcotic pain medicine is not needed, then you may take acetaminophen (Tylenol), naprosyn (Alleve) or ibuprofen (Advil) as needed. 2. Take your usually prescribed medications unless otherwise directed 3. If you need a refill on your pain medication, please contact your pharmacy.  They will contact our office to request authorization.  Prescriptions will not be filled after 5pm or on week-ends. 4. You should eat very light the first 24 hours after surgery, such as soup, crackers, pudding, etc.  Resume your normal diet the day after surgery. 5. Most patients will experience some swelling and bruising in the breast.  Ice packs and a good support bra will help.  Wear the breast binder provided or a sports bra for 72 hours day and night.  After that wear a sports bra during the day until you return to the office. Swelling and bruising can take several days to resolve.  6. It is common to experience some constipation if taking pain medication after surgery.  Increasing fluid intake and taking a stool softener will usually help or prevent this problem from occurring.  A mild laxative (Milk of Magnesia or Miralax) should be taken according to package directions if there are no bowel movements after 48 hours. 7. Unless discharge instructions indicate otherwise, you may remove your bandages 48  hours after surgery and you may shower at that time.  You may have steri-strips (small skin tapes) in place directly over the incision.  These strips should be left on the skin for 7-10 days and will come off on their own.  If your surgeon used skin glue on the incision, you may shower in 24 hours.  The glue will flake off over the next 2-3 weeks.  Any sutures or staples will be removed at the office during your follow-up visit. 8. ACTIVITIES:  You may resume regular daily activities (gradually increasing) beginning the next day.  Wearing a good support bra or sports bra minimizes pain and swelling.  You may have sexual intercourse when it is comfortable. a. You may drive when you no longer are taking prescription pain medication, you can comfortably wear a seatbelt, and you can safely maneuver your car and apply brakes. b. RETURN TO WORK:  ______________________________________________________________________________________ 9. You should see your doctor in the office for a follow-up appointment approximately two weeks after your surgery.  Your doctor's nurse will typically make your follow-up appointment when she calls you with your pathology report.  Expect your pathology report 3-4 business days after your surgery.  You may call to check if you do not hear from us after three days. 10. OTHER INSTRUCTIONS: _______________________________________________________________________________________________ _____________________________________________________________________________________________________________________________________ _____________________________________________________________________________________________________________________________________ _____________________________________________________________________________________________________________________________________  WHEN TO CALL DR WAKEFIELD: 1. Fever over 101.0 2. Nausea and/or vomiting. 3. Extreme swelling or  bruising. 4. Continued bleeding from incision. 5. Increased pain, redness, or drainage from the incision.  The clinic   staff is available to answer your questions during regular business hours.  Please don't hesitate to call and ask to speak to one of the nurses for clinical concerns.  If you have a medical emergency, go to the nearest emergency room or call 911.  A surgeon from [facility] is always on call at the hospital.  For further questions, please visit [email or web address]  Post Anesthesia Home Care Instructions  Activity: Get plenty of rest for the remainder of the day. A responsible individual must stay with you for 24 hours following the procedure.  For the next 24 hours, DO NOT: -Drive a car -Paediatric nurse -Drink alcoholic beverages -Take any medication unless instructed by your physician -Make any legal decisions or sign important papers.  Meals: Start with liquid foods such as gelatin or soup. Progress to regular foods as tolerated. Avoid greasy, spicy, heavy foods. If nausea and/or vomiting occur, drink only clear liquids until the nausea and/or vomiting subsides. Call your physician if vomiting continues.  Special Instructions/Symptoms: Your throat may feel dry or sore from the anesthesia or the breathing tube placed in your throat during surgery. If this causes discomfort, gargle with warm salt water. The discomfort should disappear within 24 hours.

## 2020-05-24 ENCOUNTER — Encounter (HOSPITAL_BASED_OUTPATIENT_CLINIC_OR_DEPARTMENT_OTHER): Payer: Self-pay | Admitting: General Surgery

## 2020-05-27 LAB — SURGICAL PATHOLOGY

## 2020-07-19 ENCOUNTER — Encounter: Payer: Self-pay | Admitting: Orthopedic Surgery

## 2020-07-19 ENCOUNTER — Ambulatory Visit (INDEPENDENT_AMBULATORY_CARE_PROVIDER_SITE_OTHER): Payer: BC Managed Care – PPO | Admitting: Orthopedic Surgery

## 2020-07-19 ENCOUNTER — Other Ambulatory Visit: Payer: Self-pay

## 2020-07-19 DIAGNOSIS — S8265XA Nondisplaced fracture of lateral malleolus of left fibula, initial encounter for closed fracture: Secondary | ICD-10-CM | POA: Diagnosis not present

## 2020-07-19 MED ORDER — ASPIRIN EC 81 MG PO TBEC: DELAYED_RELEASE_TABLET | ORAL | 11 refills | Status: DC

## 2020-07-19 NOTE — Progress Notes (Signed)
Office Visit Note   Patient: Wanda Douglas           Date of Birth: Dec 18, 1970           MRN: [id number] Visit Date: 07/19/2020 Requested by: Garnet Sierras, NP [address] [place],  [place] [address] PCP: Unk Pinto, MD  Subjective: Chief Complaint  Patient presents with  . Left Ankle - Pain    HPI: Patient presents for evaluation of left ankle pain.  Date of injury 07/18/2020.  She slipped while she was leaving a meeting in [place].  She is an Tourist information centre manager.  Sprained ankle in 2009 but no prior surgery.  No personal or family history of DVT or pulmonary embolism.  She does state that her father at the end of his life may or may not have had some issue with phlebitis.  She reports primarily lateral sided pain but not medial sided pain.  She wants to keep working as much as possible              ROS: All systems reviewed are negative as they relate to the chief complaint within the history of present illness.  Patient denies  fevers or chills.   Assessment & Plan: Visit Diagnoses:  1. Closed nondisplaced fracture of lateral malleolus of left fibula, initial encounter     Plan: Impression is relatively nondisplaced left lateral malleolus fracture.  Mortise is symmetric.  No medial sided tenderness.  In general I think if this fracture heals in the position it then she will do well.  However it may shift and move or shortening.  If that is the case then she will need to undergo operative fixation.  Plan is to have her keep it elevated in the fracture boot nonweightbearing for another week.  Follow-up in 1 week for repeat AP lateral mortise radiographs to assess for displacement.  If it is shifting or moving then we will need to consider operative intervention.  Crutches changed.  Knee scooter prescribed.  Aspirin 81 mg twice a day for DVT prophylaxis.  Continue with Mobic and Tylenol for pain.  Follow-Up Instructions: Return in about 1 week (around 07/26/2020).    Orders:  No orders of the defined types were placed in this encounter.  No orders of the defined types were placed in this encounter.     Procedures: No procedures performed   Clinical Data: No additional findings.  Objective: Vital Signs: There were no vitals taken for this visit.  Physical Exam:   Constitutional: Patient appears well-developed HEENT:  Head: Normocephalic Eyes:EOM are normal Neck: Normal range of motion Cardiovascular: Normal rate Pulmonary/chest: Effort normal Neurologic: Patient is alert Skin: Skin is warm Psychiatric: Patient has normal mood and affect    Ortho Exam: Ortho exam demonstrates mild lateral swelling with no fracture blisters.  Pedal pulses intact.  Sensation intact on the dorsal plantar aspect of the left foot.  No calf tenderness with negative Homans.  No medial sided tenderness at the medial malleolus.  Specialty Comments:  No specialty comments available.  Imaging: No results found.   PMFS History: Patient Active Problem List   Diagnosis Date Noted  . COVID-19 (04/30/2020) 05/06/2020  . Insulin resistance 03/05/2020  . Vitamin D deficiency 04/20/2018  . Hyperlipidemia 10/18/2017  . Morbid obesity with BMI of 40.0-44.9, adult ([facility]) 10/18/2017  . 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 Multiple food allergies 12/19/2013  . Seasonal allergies 12/19/2013  . 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 Asthma   . Bloody discharge from left nipple 07/2017  . History of COVID-19 04/29/2020  . Irregular menstrual cycle   . Left breast mass 07/2017  . Pituitary adenoma ([facility])     Family History  Problem Relation Age of Onset  . Breast cancer Paternal Grandmother   . Diabetes Paternal Grandmother   . Bladder Cancer Paternal Grandmother   . Hypertension Mother   . Spina bifida Mother   . Drug abuse Mother   . Bladder Cancer Mother         smoker  . Myopathy Father        Inclusion myolitis  . Lung cancer Maternal Grandmother        smoker  . Rheum arthritis Maternal Grandmother   . Hypertension Maternal Grandfather   . Diabetes Maternal Grandfather     Past Surgical History:  Procedure Laterality Date  . BREAST DUCTAL SYSTEM EXCISION Left 07/26/2017   Procedure: EXCISION DUCTAL SYSTEM BREAST;  Surgeon: Rolm Bookbinder, MD;  Location: [facility];  Service: General;  Laterality: Left;  . BREAST DUCTAL SYSTEM EXCISION Bilateral 05/23/2020   Procedure: BIL BREAST CENTRAL DUCT EXCISION;  Surgeon: Rolm Bookbinder, MD;  Location: [facility];  Service: General;  Laterality: Bilateral;  . RADIOACTIVE SEED GUIDED EXCISIONAL BREAST BIOPSY Left 07/26/2017   Procedure: LEFT RADIOACTIVE SEED GUIDED EXCISIONAL BREAST BIOPSY ERAS PATHWAY;  Surgeon: Rolm Bookbinder, MD;  Location: [facility];  Service: General;  Laterality: Left;  . RADIOACTIVE SEED GUIDED EXCISIONAL BREAST BIOPSY Right 06/16/2018   Procedure: RADIOACTIVE SEED GUIDED EXCISIONAL RIGHT BREAST BIOPSY;  Surgeon: Rolm Bookbinder, MD;  Location: [facility];  Service: General;  Laterality: Right;  . right breast biopsy Right 2019   With axillary lymph node biopsy  . TONSILLECTOMY     Social History   Occupational History  . Occupation: Microbiologist  Tobacco Use  . Smoking status: Never Smoker  . Smokeless tobacco: Never Used  Vaping Use  . Vaping Use: Never used  Substance and Sexual Activity  . Alcohol use: No  . Drug use: No  . Sexual activity: Never    Comment: not sexually active

## 2020-07-22 ENCOUNTER — Encounter: Payer: Self-pay | Admitting: Orthopedic Surgery

## 2020-07-22 ENCOUNTER — Other Ambulatory Visit: Payer: Self-pay | Admitting: Surgical

## 2020-07-22 MED ORDER — TRAMADOL HCL 50 MG PO TABS: 50.0000 mg | ORAL_TABLET | Freq: Four times a day (QID) | ORAL | 0 refills | Status: DC | PRN

## 2020-07-22 NOTE — Telephone Encounter (Signed)
Sent in RX for tramadol

## 2020-07-26 ENCOUNTER — Ambulatory Visit (INDEPENDENT_AMBULATORY_CARE_PROVIDER_SITE_OTHER): Payer: BC Managed Care – PPO | Admitting: Orthopedic Surgery

## 2020-07-26 ENCOUNTER — Ambulatory Visit (INDEPENDENT_AMBULATORY_CARE_PROVIDER_SITE_OTHER): Payer: BC Managed Care – PPO

## 2020-07-26 DIAGNOSIS — S8265XA Nondisplaced fracture of lateral malleolus of left fibula, initial encounter for closed fracture: Secondary | ICD-10-CM | POA: Diagnosis not present

## 2020-07-28 ENCOUNTER — Encounter: Payer: Self-pay | Admitting: Orthopedic Surgery

## 2020-07-28 NOTE — Progress Notes (Signed)
Post-Op Visit Note   Patient: Wanda Douglas           Date of Birth: 07/07/1970           MRN: [id number] Visit Date: 07/26/2020 PCP: Unk Pinto, MD   Assessment & Plan:  Chief Complaint:  Chief Complaint  Patient presents with   Left Ankle - Injury, Follow-up   Visit Diagnoses:  1. Closed nondisplaced fracture of lateral malleolus of left fibula, initial encounter     Plan: Patient presents for follow-up of left ankle lateral malleolar fracture.  On exam she has diminished swelling.  Negative Homans no calf tenderness.  Patient does live alone.  This injury happened when she was leaving a parking lot.  She slipped and fell on a wet ramp.  She has been nonweightbearing in a fracture boot.  Radiographs show fairly minimal change in ankle fracture displacement.  We need to see her back in a week for repeat radiographs and if the fracture moves any more likely may have to fix it.  We will keep her out of work next week just so she can facilitate keeping the leg elevated and not putting weight on it.  Surgery is still a possibility but in general the fracture is remaining relatively nondisplaced.  Follow-Up Instructions: Return in about 1 week (around 08/02/2020).   Orders:  Orders Placed This Encounter  Procedures   XR Ankle Complete Left   No orders of the defined types were placed in this encounter.   Imaging: No results found.  PMFS History: Patient Active Problem List   Diagnosis Date Noted   COVID-19 (04/30/2020) 05/06/2020   Insulin resistance 03/05/2020   Vitamin D deficiency 04/20/2018   Hyperlipidemia 10/18/2017   Morbid obesity with BMI of 40.0-44.9, adult ([facility]) 10/18/2017   Intraductal papilloma 06/16/2017   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   Multiple food allergies 12/19/2013   Seasonal allergies 12/19/2013   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   Past Medical History:  Diagnosis Date   Asthma    Bloody discharge  from left nipple 07/2017   History of COVID-19 04/29/2020   Irregular menstrual cycle    Left breast mass 07/2017   Pituitary adenoma ([facility])     Family History  Problem Relation Age of Onset   Breast cancer Paternal Grandmother    Diabetes Paternal Grandmother    Bladder Cancer Paternal Grandmother    Hypertension Mother    Spina bifida Mother    Drug abuse Mother    Bladder Cancer Mother        smoker   Myopathy Father        Inclusion myolitis   Lung cancer Maternal Grandmother        smoker   Rheum arthritis Maternal Grandmother    Hypertension Maternal Grandfather    Diabetes Maternal Grandfather     Past Surgical History:  Procedure Laterality Date   BREAST DUCTAL SYSTEM EXCISION Left 07/26/2017   Procedure: EXCISION DUCTAL SYSTEM BREAST;  Surgeon: Rolm Bookbinder, MD;  Location: [facility];  Service: General;  Laterality: Left;   BREAST DUCTAL SYSTEM EXCISION Bilateral 05/23/2020   Procedure: BIL BREAST CENTRAL DUCT EXCISION;  Surgeon: Rolm Bookbinder, MD;  Location: [facility];  Service: General;  Laterality: Bilateral;   RADIOACTIVE SEED GUIDED EXCISIONAL BREAST BIOPSY Left 07/26/2017   Procedure: LEFT RADIOACTIVE SEED GUIDED EXCISIONAL BREAST BIOPSY ERAS PATHWAY;  Surgeon: Rolm Bookbinder, MD;  Location: [facility];  Service: General;  Laterality: Left;   RADIOACTIVE SEED GUIDED EXCISIONAL BREAST BIOPSY Right 06/16/2018   Procedure: RADIOACTIVE SEED GUIDED EXCISIONAL RIGHT BREAST BIOPSY;  Surgeon: Rolm Bookbinder, MD;  Location: [facility];  Service: General;  Laterality: Right;   right breast biopsy Right 2019   With axillary lymph node biopsy   TONSILLECTOMY     Social History   Occupational History   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

## 2020-08-05 ENCOUNTER — Other Ambulatory Visit: Payer: Self-pay

## 2020-08-05 ENCOUNTER — Ambulatory Visit (INDEPENDENT_AMBULATORY_CARE_PROVIDER_SITE_OTHER): Payer: BC Managed Care – PPO | Admitting: Orthopedic Surgery

## 2020-08-05 ENCOUNTER — Ambulatory Visit (INDEPENDENT_AMBULATORY_CARE_PROVIDER_SITE_OTHER): Payer: BC Managed Care – PPO

## 2020-08-05 DIAGNOSIS — S8265XA Nondisplaced fracture of lateral malleolus of left fibula, initial encounter for closed fracture: Secondary | ICD-10-CM | POA: Diagnosis not present

## 2020-08-06 ENCOUNTER — Ambulatory Visit: Payer: BLUE CROSS/BLUE SHIELD | Admitting: Adult Health

## 2020-08-08 ENCOUNTER — Encounter: Payer: Self-pay | Admitting: Orthopedic Surgery

## 2020-08-08 NOTE — Progress Notes (Signed)
Post-Op Visit Note   Patient: Wanda Douglas           Date of Birth: [birth year]           MRN: [id number] Visit Date: 08/05/2020 PCP: Unk Pinto, MD   Assessment & Plan:  Chief Complaint:  Chief Complaint  Patient presents with  . Left Ankle - Fracture, Follow-up    DOI: 07/18/20   Visit Diagnoses:  1. Closed nondisplaced fracture of lateral malleolus of left fibula, initial encounter     Plan: Wanda Douglas is a 50 year old patient who is now about 3 weeks out left ankle lateral malleolus fracture.  She has been nonweightbearing on a scooter.  Working from home.  On exam negative Homans and no calf tenderness today.  Swelling has diminished.  Mild lateral sided pain is present but this has also improved.  Radiographs look unchanged.  Ankle mortise is symmetric.  No medial sided tenderness.  Plan is to continue to work from home for 2 weeks.  I want her to start ankle range of motion exercises 30 reps 3 times a day.  We will see her back in 2 weeks for final radiographs and likely weightbearing as tolerated in a boot for a week and then weightbearing as tolerated in regular shoes thereafter.  Follow-up with Wanda Douglas on a Friday afternoon for examination.  Follow-Up Instructions: Return in about 2 years (around 08/06/2022).   Orders:  Orders Placed This Encounter  Procedures  . XR Ankle Complete Left   No orders of the defined types were placed in this encounter.   Imaging: No results found.  PMFS History: Patient Active Problem List   Diagnosis Date Noted  . COVID-19 (04/30/2020) 05/06/2020  . Insulin resistance 03/05/2020  . Vitamin D deficiency 04/20/2018  . Hyperlipidemia 10/18/2017  . Morbid obesity with BMI of 40.0-44.9, adult ([facility]) 10/18/2017  . 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 Multiple food allergies 12/19/2013  . Seasonal allergies 12/19/2013  . 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   Past Medical History:   Diagnosis Date  . Asthma   . Bloody discharge from left nipple 07/2017  . History of COVID-19 04/29/2020  . Irregular menstrual cycle   . Left breast mass 07/2017  . Pituitary adenoma ([facility])     Family History  Problem Relation Age of Onset  . Breast cancer Paternal Grandmother   . Diabetes Paternal Grandmother   . Bladder Cancer Paternal Grandmother   . Hypertension Mother   . Spina bifida Mother   . Drug abuse Mother   . Bladder Cancer Mother        smoker  . Myopathy Father        Inclusion myolitis  . Lung cancer Maternal Grandmother        smoker  . Rheum arthritis Maternal Grandmother   . Hypertension Maternal Grandfather   . Diabetes Maternal Grandfather     Past Surgical History:  Procedure Laterality Date  . BREAST DUCTAL SYSTEM EXCISION Left 07/26/2017   Procedure: EXCISION DUCTAL SYSTEM BREAST;  Surgeon: Rolm Bookbinder, MD;  Location: [facility];  Service: General;  Laterality: Left;  . BREAST DUCTAL SYSTEM EXCISION Bilateral 05/23/2020   Procedure: BIL BREAST CENTRAL DUCT EXCISION;  Surgeon: Rolm Bookbinder, MD;  Location: [facility];  Service: General;  Laterality: Bilateral;  . RADIOACTIVE SEED GUIDED EXCISIONAL BREAST BIOPSY Left 07/26/2017   Procedure: LEFT RADIOACTIVE SEED GUIDED EXCISIONAL BREAST BIOPSY ERAS PATHWAY;  Surgeon:  Rolm Bookbinder, MD;  Location: [facility];  Service: General;  Laterality: Left;  . RADIOACTIVE SEED GUIDED EXCISIONAL BREAST BIOPSY Right 06/16/2018   Procedure: RADIOACTIVE SEED GUIDED EXCISIONAL RIGHT BREAST BIOPSY;  Surgeon: Rolm Bookbinder, MD;  Location: [facility];  Service: General;  Laterality: Right;  . right breast biopsy Right 2019   With axillary lymph node biopsy  . TONSILLECTOMY     Social History   Occupational History  . Occupation: Microbiologist  Tobacco Use  . Smoking status: Never Smoker  . Smokeless tobacco: Never Used   Vaping Use  . Vaping Use: Never used  Substance and Sexual Activity  . Alcohol use: No  . Drug use: No  . Sexual activity: Never    Comment: not sexually active

## 2020-08-19 ENCOUNTER — Ambulatory Visit (INDEPENDENT_AMBULATORY_CARE_PROVIDER_SITE_OTHER): Payer: BC Managed Care – PPO

## 2020-08-19 ENCOUNTER — Ambulatory Visit (INDEPENDENT_AMBULATORY_CARE_PROVIDER_SITE_OTHER): Payer: BC Managed Care – PPO | Admitting: Orthopedic Surgery

## 2020-08-19 ENCOUNTER — Encounter: Payer: Self-pay | Admitting: Orthopedic Surgery

## 2020-08-19 DIAGNOSIS — S8265XA Nondisplaced fracture of lateral malleolus of left fibula, initial encounter for closed fracture: Secondary | ICD-10-CM | POA: Diagnosis not present

## 2020-08-19 NOTE — Progress Notes (Signed)
Post-Op Visit Note   Patient: Wanda Douglas           Date of Birth: 1970-07-07           MRN: [id number] Visit Date: 08/19/2020 PCP: Unk Pinto, MD   Assessment & Plan:  Chief Complaint:  Chief Complaint  Patient presents with  . Left Ankle - Routine Post Op    Patient reports doing well.     Visit Diagnoses:  1. Closed nondisplaced fracture of lateral malleolus of left fibula, initial encounter     Plan: Patient is a 50 year old female who returns for reevaluation of left fibular fracture.  Date of injury was 07/18/2020.  She has continued to be nonweightbearing on the injured extremity.  She reports pain has continued to improve to the point where she does not really have any pain at this time.  She has not really walked on the ankle aside from occasionally putting her foot down for transferring.  On exam she has very minimal tenderness just proximal to the lateral malleolus.  Syndesmosis is stable and intact.  No tenderness over the lateral mall.  Lacks dorsiflexion past 5 degrees.  Radiographs today show new callus formation is not present on last radiographs with no further displacement of the fracture site.  Plan to progress patient to weightbearing as tolerated in the fracture boot for 1 week and then she may transition to weightbearing as tolerated in regular shoe.  Okay if she has to transition to regular shoe over the course of that second week.  Follow-up in 4 weeks for clinical recheck and this will likely be the final check and release.  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  Patient will call the office if she has any concerns in the next month.  Follow-Up Instructions: No follow-ups on file.   Orders:  Orders Placed This Encounter  Procedures  . XR Ankle Complete Left   No orders of the defined types were placed in this encounter.   Imaging: No results found.  PMFS History: Patient Active Problem List    Diagnosis Date Noted  . COVID-19 (04/30/2020) 05/06/2020  . Insulin resistance 03/05/2020  . Vitamin D deficiency 04/20/2018  . Hyperlipidemia 10/18/2017  . Morbid obesity with BMI of 40.0-44.9, adult ([facility]) 10/18/2017  . 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 Multiple food allergies 12/19/2013  . Seasonal allergies 12/19/2013  . 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   Past Medical History:  Diagnosis Date  . Asthma   . Bloody discharge from left nipple 07/2017  . History of COVID-19 04/29/2020  . Irregular menstrual cycle   . Left breast mass 07/2017  . Pituitary adenoma ([facility])     Family History  Problem Relation Age of Onset  . Breast cancer Paternal Grandmother   . Diabetes Paternal Grandmother   . Bladder Cancer Paternal Grandmother   . Hypertension Mother   . Spina bifida Mother   . Drug abuse Mother   . Bladder Cancer Mother        smoker  . Myopathy Father        Inclusion myolitis  . Lung cancer Maternal Grandmother        smoker  . Rheum arthritis Maternal Grandmother   . Hypertension Maternal Grandfather   . Diabetes Maternal Grandfather     Past Surgical History:  Procedure Laterality Date  . BREAST DUCTAL SYSTEM EXCISION Left 07/26/2017   Procedure: EXCISION  DUCTAL SYSTEM BREAST;  Surgeon: Rolm Bookbinder, MD;  Location: [facility];  Service: General;  Laterality: Left;  . BREAST DUCTAL SYSTEM EXCISION Bilateral 05/23/2020   Procedure: BIL BREAST CENTRAL DUCT EXCISION;  Surgeon: Rolm Bookbinder, MD;  Location: [facility];  Service: General;  Laterality: Bilateral;  . RADIOACTIVE SEED GUIDED EXCISIONAL BREAST BIOPSY Left 07/26/2017   Procedure: LEFT RADIOACTIVE SEED GUIDED EXCISIONAL BREAST BIOPSY ERAS PATHWAY;  Surgeon: Rolm Bookbinder, MD;  Location: [facility];  Service: General;  Laterality: Left;  . RADIOACTIVE SEED GUIDED EXCISIONAL BREAST BIOPSY Right  06/16/2018   Procedure: RADIOACTIVE SEED GUIDED EXCISIONAL RIGHT BREAST BIOPSY;  Surgeon: Rolm Bookbinder, MD;  Location: [facility];  Service: General;  Laterality: Right;  . right breast biopsy Right 2019   With axillary lymph node biopsy  . TONSILLECTOMY     Social History   Occupational History  . Occupation: Microbiologist  Tobacco Use  . Smoking status: Never Smoker  . Smokeless tobacco: Never Used  Vaping Use  . Vaping Use: Never used  Substance and Sexual Activity  . Alcohol use: No  . Drug use: No  . Sexual activity: Never    Comment: not sexually active

## 2020-08-24 ENCOUNTER — Encounter: Payer: Self-pay | Admitting: Orthopedic Surgery

## 2020-09-18 ENCOUNTER — Ambulatory Visit: Payer: Self-pay

## 2020-09-18 ENCOUNTER — Other Ambulatory Visit: Payer: Self-pay

## 2020-09-18 ENCOUNTER — Ambulatory Visit (INDEPENDENT_AMBULATORY_CARE_PROVIDER_SITE_OTHER): Payer: BC Managed Care – PPO | Admitting: Orthopedic Surgery

## 2020-09-18 DIAGNOSIS — S8265XA Nondisplaced fracture of lateral malleolus of left fibula, initial encounter for closed fracture: Secondary | ICD-10-CM

## 2020-09-19 ENCOUNTER — Encounter: Payer: Self-pay | Admitting: Orthopedic Surgery

## 2020-09-19 NOTE — Progress Notes (Signed)
Post-Op Visit Note   Patient: Wanda Douglas           Date of Birth: Mar 23, 1971           MRN: [id number] Visit Date: 09/18/2020 PCP: Unk Pinto, MD   Assessment & Plan:  Chief Complaint:  Chief Complaint  Patient presents with  . Other     F/u ankle fx   Visit Diagnoses:  1. Closed nondisplaced fracture of lateral malleolus of left fibula, initial encounter     Plan: Patient presents now about 2 months out left fibular fracture.  She has been full weightbearing in the shoe with Ace wrap.  She is able to go up stairs well but has some difficulty descending stairs.  Overall she is about 90% there in terms of her self evaluated improvement.  On exam she has improved range of motion and minimal tenderness over the fibula.  Syndesmosis is stable.  Radiographs look good.  Continue with weightbearing as tolerated.  New Ace wrap applied today.  Follow-up as needed.  Okay to resume full activities in 4 weeks in terms of athletic type activities.  Follow-Up Instructions: Return if symptoms worsen or fail to improve.   Orders:  Orders Placed This Encounter  Procedures  . XR Ankle Complete Left   No orders of the defined types were placed in this encounter.   Imaging: XR Ankle Complete Left  Result Date: 09/19/2020 AP lateral mortise radiographs left ankle reviewed.  Lateral malleolus fracture has excellent callus formation present with no displacement compared to prior radiographs.  Ankle mortise symmetric.  No medial sided pathology present.  Overall fracture callus maturation has occurred in the interval.   PMFS History: Patient Active Problem List   Diagnosis Date Noted  . COVID-19 (04/30/2020) 05/06/2020  . Insulin resistance 03/05/2020  . Vitamin D deficiency 04/20/2018  . Hyperlipidemia 10/18/2017  . Morbid obesity with BMI of 40.0-44.9, adult ([facility]) 10/18/2017  . 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 Multiple food allergies  12/19/2013  . Seasonal allergies 12/19/2013  . 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   Past Medical History:  Diagnosis Date  . Asthma   . Bloody discharge from left nipple 07/2017  . History of COVID-19 04/29/2020  . Irregular menstrual cycle   . Left breast mass 07/2017  . Pituitary adenoma ([facility])     Family History  Problem Relation Age of Onset  . Breast cancer Paternal Grandmother   . Diabetes Paternal Grandmother   . Bladder Cancer Paternal Grandmother   . Hypertension Mother   . Spina bifida Mother   . Drug abuse Mother   . Bladder Cancer Mother        smoker  . Myopathy Father        Inclusion myolitis  . Lung cancer Maternal Grandmother        smoker  . Rheum arthritis Maternal Grandmother   . Hypertension Maternal Grandfather   . Diabetes Maternal Grandfather     Past Surgical History:  Procedure Laterality Date  . BREAST DUCTAL SYSTEM EXCISION Left 07/26/2017   Procedure: EXCISION DUCTAL SYSTEM BREAST;  Surgeon: Rolm Bookbinder, MD;  Location: [facility];  Service: General;  Laterality: Left;  . BREAST DUCTAL SYSTEM EXCISION Bilateral 05/23/2020   Procedure: BIL BREAST CENTRAL DUCT EXCISION;  Surgeon: Rolm Bookbinder, MD;  Location: [facility];  Service: General;  Laterality: Bilateral;  . RADIOACTIVE SEED GUIDED EXCISIONAL BREAST BIOPSY Left 07/26/2017  Procedure: LEFT RADIOACTIVE SEED GUIDED EXCISIONAL BREAST BIOPSY ERAS PATHWAY;  Surgeon: Rolm Bookbinder, MD;  Location: [facility];  Service: General;  Laterality: Left;  . RADIOACTIVE SEED GUIDED EXCISIONAL BREAST BIOPSY Right 06/16/2018   Procedure: RADIOACTIVE SEED GUIDED EXCISIONAL RIGHT BREAST BIOPSY;  Surgeon: Rolm Bookbinder, MD;  Location: [facility];  Service: General;  Laterality: Right;  . right breast biopsy Right 2019   With axillary lymph node biopsy  . TONSILLECTOMY     Social History   Occupational History   . Occupation: Microbiologist  Tobacco Use  . Smoking status: Never Smoker  . Smokeless tobacco: Never Used  Vaping Use  . Vaping Use: Never used  Substance and Sexual Activity  . Alcohol use: No  . Drug use: No  . Sexual activity: Never    Comment: not sexually active

## 2020-10-30 ENCOUNTER — Ambulatory Visit: Payer: BC Managed Care – PPO | Admitting: Orthopedic Surgery

## 2020-11-05 ENCOUNTER — Encounter: Payer: BLUE CROSS/BLUE SHIELD | Admitting: Adult Health

## 2021-02-04 ENCOUNTER — Encounter: Payer: BLUE CROSS/BLUE SHIELD | Admitting: Adult Health

## 2021-02-19 NOTE — Progress Notes (Signed)
Complete Physical  Assessment and Plan:  Diagnoses and all orders for this visit:  Encounter for routine adult health examination without abnormal findings She will follow up with GYN for pelvic/breasts   Intrinsic asthma Well controlled, avoid triggers, continue inhalers  Prolactin secreting Pituitary Adenoma (1997) Followed by Dr. Dwyane Dee, responded well to cabogoline -  Check protactin, will refer back if elevated   Multiple food allergies Avoid triggers, doing well on OTC agents  Intraductal papilloma Continue annual mammograms Established with Dr. Donne Hazel to follow up   HX of seasonal allergies Continue OTC allergy pills  Mixed hyperlipidemia Mild elevations currently treated by lifestyle modification only Continue low cholesterol diet and exercise.  Check lipid panel.  CMP TSH  Medication Management CBC Magnesium  Screening for proteinuria or hematuria Routine urine with reflex microscopic Microalbumin/creatinine urine ratio   Morbid obesity Long discussion about weight loss, diet, and exercise Recommended diet heavy in fruits and veggies and low in animal meats, cheeses, and dairy products, appropriate calorie intake Discussed appropriate weight for height  Will start the patient on phentermine- hand out given and AE's discussed, will do close follow up. Return in 4 weeks, obtain EKG at that time Start monitoring food intake via log or app to review next visit    No orders of the defined types were placed in this encounter.   Discussed med's effects and SE's. Screening labs and tests as requested with regular follow-up as recommended. Over 40 minutes of exam, counseling, chart review, and complex, high level critical decision making was performed this visit.   Future Appointments  Date Time Provider [place]  02/20/2021  3:00 PM Magda Bernheim, NP [facility] None     HPI  50 y.o. AA female, presents for a complete physical and follow up for  has Intrinsic asthma; Multiple food allergies; Seasonal allergies; Prolactin secreting Pituitary Adenoma (1997); Intraductal papilloma; Hyperlipidemia; Morbid obesity with BMI of 40.0-44.9, adult ([place]); Vitamin D deficiency; Insulin resistance; and COVID-19 (04/30/2020) on their problem list.    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 Had biopsy of left and right which showed intraductal papilloma. Prolactin 02/2020 93.0. Pt saw Dr. Dwyane Dee 02/19/20- has benign 4mm pituitary adenoma He prescribed Dostinex 0.25mg  twice a weeks and wanted her to follow up in 2 months. Recommend would take until menopause. Also recommend reviewing use of Phentermine as BP and pulse were higher at his visit.    She has multiple allergies and mild intermittent asthma, zyrtec, flonase, off of breo, just albuterol PRN and continues to do well with this, arely needing except during the fall.    BMI is There is no height or weight on file to calculate BMI., she has been working on diet and exercise,  Some weight gain with family members were ill, mom passed away from cancer in 2022/11/16, was overeating. Has been eating at home more and doing better, increasing water, fruit/veggies, reducing carbs/sweets, keeping out of the house She walks a lot, on her feet all day and walking at work, wants to start tracking steps Wt Readings from Last 3 Encounters:  05/23/20 247 lb 2.2 oz (112.1 kg)  03/06/20 249 lb (112.9 kg)  02/19/20 251 lb 6.4 oz (114 kg)   Her blood pressure has been controlled at home, today their BP is   She does workout. She denies chest pain,  shortness of breath, dizziness.   She is not on cholesterol medication and denies myalgias. Her cholesterol is not at goal. The cholesterol last visit was:   Lab Results  Component Value Date   CHOL 178 02/05/2020   HDL 57 02/05/2020   LDLCALC 100 (H)  02/05/2020   TRIG 116 02/05/2020   CHOLHDL 3.1 02/05/2020   Last A1C in the office was:  Lab Results  Component Value Date   HGBA1C 5.6 02/05/2020   Last GFR: Lab Results  Component Value Date   GFRAA 81 02/05/2020   Patient is on Vitamin D supplement, ? 5000 IU daily    Lab Results  Component Value Date   VD25OH 21 (L) 02/05/2020        Current Medications:  Current Outpatient Medications on File Prior to Visit  Medication Sig Dispense Refill   albuterol (VENTOLIN HFA) 108 (90 Base) MCG/ACT inhaler 1 to 2 puffs (5 minutes apart) every 4 hour as needed for Asthma 18 g 1   aspirin EC 81 MG tablet 1 po bid 60 tablet 11   Azelastine HCl 137 MCG/SPRAY SOLN PLACE 2 SPRAYS INTO BOTH NOSTRILS 2 (TWO) TIMES DAILY. USE IN EACH NOSTRIL AS DIRECTED 30 mL 1   budesonide-formoterol (SYMBICORT) 80-4.5 MCG/ACT inhaler Inhale 2 puffs into the lungs in the morning and at bedtime. Rinse mouth after use. 1 each 12   cabergoline (DOSTINEX) 0.5 MG tablet TAKE 1/2 OF A TABLET (0.25MG  TOTAL) BY MOUTH 2 TIMES A WEEK. 10 tablet 0   calcium carbonate (TUMS EX) 750 MG chewable tablet Chew 2 tablets by mouth daily.     cetirizine (ZYRTEC) 10 MG tablet Take 1 tablet (10 mg total) by mouth daily. 90 tablet 1   cholecalciferol (VITAMIN D) 1000 UNITS tablet Take 1,000 Units by mouth daily.     fluticasone (FLONASE) 50 MCG/ACT nasal spray Place 1 spray into both nostrils daily for 3 days. 1 g 0   ibuprofen (ADVIL,MOTRIN) 800 MG tablet Take 1 tablet (800 mg total) by mouth 3 (three) times daily. (Patient taking differently: 800 mg as needed.) 21 tablet 0   Multiple Vitamin (MULTIVITAMIN) tablet Take 1 tablet by mouth daily.     phentermine (ADIPEX-P) 37.5 MG tablet Take 1/2 to 1 tablet every morning for dieting & weightloss 30 tablet 0   traMADol (ULTRAM) 50 MG tablet Take 1 tablet (50 mg total) by mouth every 6 (six) hours as needed. 20 tablet 0   No current facility-administered medications on file prior to  visit.   Allergies:  Allergies  Allergen Reactions   Banana Hives and Shortness Of Breath   Peanut-Containing Drug Products Hives and Shortness Of Breath    All nuts (allergic) hives    Shellfish Allergy Hives and Shortness Of Breath   Chlorhexidine Hives   Medical History:  She has Intrinsic asthma; Multiple food allergies; Seasonal allergies; Prolactin secreting Pituitary Adenoma (1997); Intraductal papilloma; Hyperlipidemia; Morbid obesity with BMI of 40.0-44.9, adult ([place]); Vitamin D deficiency; Insulin resistance; and COVID-19 (04/30/2020) on their problem list. Health Maintenance:   Immunization History  Administered Date(s) Administered   Influenza Inj Mdck Quad With Preservative 03/06/2020   Moderna Sars-Covid-2 Vaccination 08/03/2019, 09/05/2019   Tdap 10/19/2017    Tetanus: 2019 Pneumovax:-  Prevnar 13: - Flu vaccine: declines  Zostavax: n/a  Covid 19: 2/2, 2021, moderna  LMP: No LMP recorded.  Pap: Remote, never sexually active, will follow up with GYN, has number to schedule MGM: 01/29/2020 bil diagnostic,  had biopsy today  DEXA: n/a  Colonoscopy: never, due age 31 EGD: -  Last Dental Exam: Dr. ?, last 3 years ago, will schedule  Last Eye Exam: 2021, wears glasses  Patient Care Team: Unk Pinto, MD as PCP - General (Internal Medicine)  Surgical History:  She has a past surgical history that includes Tonsillectomy; Radioactive seed guided excisional breast biopsy (Left, 07/26/2017); Breast ductal system excision (Left, 07/26/2017); right breast biopsy (Right, 2019); Radioactive seed guided excisional breast biopsy (Right, 06/16/2018); and Breast ductal system excision (Bilateral, 05/23/2020). Family History:  Herfamily history includes Bladder Cancer in her mother and paternal grandmother; Breast cancer in her paternal grandmother; Diabetes in her maternal grandfather and paternal grandmother; Drug abuse in her mother; Hypertension in her maternal grandfather  and mother; Lung cancer in her maternal grandmother; Myopathy in her father; Rheum arthritis in her maternal grandmother; Spina bifida in her mother. Social History:  She reports that she has never smoked. She has never used smokeless tobacco. She reports that she does not drink alcohol and does not use drugs.  Review of Systems: Review of Systems  Constitutional:  Negative for chills, fever, malaise/fatigue and weight loss.  HENT:  Negative for congestion, hearing loss, sinus pain, sore throat and tinnitus.   Eyes:  Negative for blurred vision and double vision.  Respiratory:  Negative for cough, hemoptysis, sputum production, shortness of breath and wheezing.   Cardiovascular:  Negative for chest pain, palpitations, orthopnea, claudication, leg swelling and PND.  Gastrointestinal:  Negative for abdominal pain, blood in stool, constipation, diarrhea, heartburn, melena, nausea and vomiting.  Genitourinary: Negative.  Negative for dysuria and urgency.  Musculoskeletal:  Negative for back pain, falls, joint pain, myalgias and neck pain.  Skin:  Negative for rash.  Neurological:  Negative for dizziness, tingling, tremors, sensory change, weakness and headaches.  Endo/Heme/Allergies:  Negative for polydipsia. Does not bruise/bleed easily.  Psychiatric/Behavioral: Negative.  Negative for depression, memory loss, substance abuse and suicidal ideas. The patient is not nervous/anxious and does not have insomnia.   All other systems reviewed and are negative.  Physical Exam: Estimated body mass index is 42.42 kg/m as calculated from the following:   Height as of 05/23/20: 5\' 4"  (1.626 m).   Weight as of 05/23/20: 247 lb 2.2 oz (112.1 kg). There were no vitals taken for this visit. General Appearance: Well nourished, in no apparent distress.  Eyes: PERRLA, EOMs, conjunctiva no swelling or erythema Sinuses: No Frontal/maxillary tenderness  ENT/Mouth: Ext aud canals clear, normal light reflex with TMs  without erythema, bulging. Good dentition. No erythema, swelling, or exudate on post pharynx. Tonsils not swollen or erythematous. Hearing normal.  Neck: Supple, thyroid normal. No bruits  Respiratory: Respiratory effort normal, BS equal bilaterally without rales, rhonchi, wheezing or stridor.  Cardio: RRR without murmurs, rubs or gallops. Brisk peripheral pulses without edema.  Chest: symmetric, with normal excursions and percussion.  Breasts: Defer, just had extensively at breast center; biopsy today Abdomen: Soft, nontender, no guarding, rebound, hernias, masses, or organomegaly.  Lymphatics: Non tender without lymphadenopathy.  Genitourinary: Defer to GYN, will schedule soon  Musculoskeletal: Full ROM all peripheral extremities,5/5 strength, and normal gait.  Skin: Warm, dry without rashes, lesions, ecchymosis. Neuro: Cranial nerves intact, reflexes equal bilaterally. Normal muscle tone, no cerebellar symptoms. Sensation intact.  Psych: Awake and oriented X 3, normal affect, Insight and Judgment appropriate.   EKG: WNL in baseline reviewed, defer today  Dorian Renfro W Xylia Scherger 8:46 AM [facility]

## 2021-02-20 ENCOUNTER — Ambulatory Visit (INDEPENDENT_AMBULATORY_CARE_PROVIDER_SITE_OTHER): Payer: BC Managed Care – PPO | Admitting: Nurse Practitioner

## 2021-02-20 ENCOUNTER — Other Ambulatory Visit: Payer: Self-pay

## 2021-02-20 ENCOUNTER — Encounter: Payer: Self-pay | Admitting: Nurse Practitioner

## 2021-02-20 VITALS — BP 110/70 | HR 81 | Temp 97.9°F | Ht 64.0 in | Wt 253.2 lb

## 2021-02-20 DIAGNOSIS — Z136 Encounter for screening for cardiovascular disorders: Secondary | ICD-10-CM

## 2021-02-20 DIAGNOSIS — D369 Benign neoplasm, unspecified site: Secondary | ICD-10-CM

## 2021-02-20 DIAGNOSIS — J302 Other seasonal allergic rhinitis: Secondary | ICD-10-CM

## 2021-02-20 DIAGNOSIS — Z0000 Encounter for general adult medical examination without abnormal findings: Secondary | ICD-10-CM

## 2021-02-20 DIAGNOSIS — I10 Essential (primary) hypertension: Secondary | ICD-10-CM

## 2021-02-20 DIAGNOSIS — D352 Benign neoplasm of pituitary gland: Secondary | ICD-10-CM

## 2021-02-20 DIAGNOSIS — Z131 Encounter for screening for diabetes mellitus: Secondary | ICD-10-CM

## 2021-02-20 DIAGNOSIS — E782 Mixed hyperlipidemia: Secondary | ICD-10-CM

## 2021-02-20 DIAGNOSIS — J45909 Unspecified asthma, uncomplicated: Secondary | ICD-10-CM

## 2021-02-20 DIAGNOSIS — Z13220 Encounter for screening for lipoid disorders: Secondary | ICD-10-CM

## 2021-02-20 DIAGNOSIS — Z1389 Encounter for screening for other disorder: Secondary | ICD-10-CM

## 2021-02-20 DIAGNOSIS — Z91018 Allergy to other foods: Secondary | ICD-10-CM

## 2021-02-20 DIAGNOSIS — E559 Vitamin D deficiency, unspecified: Secondary | ICD-10-CM

## 2021-02-20 DIAGNOSIS — E8881 Metabolic syndrome: Secondary | ICD-10-CM

## 2021-02-20 DIAGNOSIS — Z23 Encounter for immunization: Secondary | ICD-10-CM

## 2021-02-20 DIAGNOSIS — Z79899 Other long term (current) drug therapy: Secondary | ICD-10-CM

## 2021-02-20 DIAGNOSIS — Z0001 Encounter for general adult medical examination with abnormal findings: Secondary | ICD-10-CM

## 2021-02-20 MED ORDER — ALBUTEROL SULFATE HFA 108 (90 BASE) MCG/ACT IN AERS: INHALATION_SPRAY | RESPIRATORY_TRACT | 1 refills | Status: DC

## 2021-02-20 MED ORDER — EPINEPHRINE 0.3 MG/0.3ML IJ SOAJ: 0.3000 mg | Freq: Once | INTRAMUSCULAR | Status: DC

## 2021-02-20 NOTE — Patient Instructions (Signed)
Ketogenic Eating Plan, Adult A ketogenic eating plan is a diet that is very low in carbohydrates, moderately low in protein, and very high in fat. Your body normally gets energy from eating carbohydrates. If you limit carbohydrates in your diet, your body will start to use stored fat as an energy source. When fat is broken down for energy, it enters your blood as a substance called ketones. A ketogenic eating plan relies mostly on ketones for energy. This eating plan can cause rapid weight loss because the body burns fat for fuel. What are the benefits of a ketogenic eating plan? Epilepsy Ketogenic eating plans have been well studied as a treatment for epilepsy in children and have been used for many years. These plans have been studied to treat epilepsy in adults too. You will want to work closely with a dietitian if your health care provider suggests this eating plan to manage your seizures. Other conditions Ketogenic eating plans have also been studied to help treat other conditions, including: Cancer. Type 2 diabetes. Alzheimer's disease. Parkinson's disease. Autism. Multiple sclerosis. More long-term studies are needed to learn the effects of this eating plan on people with these and other conditions. What are the side effects of a ketogenic eating plan? Side effects of a ketogenic diet may include: Vitamin deficiencies. Dehydration. Bad breath. Nausea. Hunger. Constipation. Problems with menstrual periods. Inflammation of the pancreas. Kidney stones or gallbladder stones. Bone fractures. High cholesterol. What are tips for following this plan? Reading food labels Look for foods that have a low glycemic index (GI) label. Read a product's ingredient list to check for hidden or added sugar. Check food labels for the number of grams of carbohydrates and protein in each serving. This is important. Cooking Carefully measure or weigh foods. Make desserts using ketogenic or low GI  recipes. Avoid cooking with sauces that have added sugar, such as barbecue sauce or ketchup. Meal planning There are several versions of ketogenic eating plans. The classic ketogenic diet recommends that 90% of your calories come from fat, 6% from protein, and 4% from carbohydrates. Aim for a daily meal and snack schedule that you can follow steadily. Every meal will include high-fat items, such as avocado, cream, butter, or mayonnaise. Each day, you should have: Fat: __________g. Protein: _________g. Carbohydrates: _________g. General information  Buy a gram scale to weigh foods. This is needed to follow this diet correctly. Your dietitian will teach you how to use it. Ask your health care provider what steps you can take to avoid side effects of this eating plan, such as constipation, dehydration, and kidney stones. Take vitamin and mineral supplements only as told by your health care provider or dietitian. Work with your dietitian and health care provider to help you stay on track. What foods should I eat? Fruits Fresh blueberries, blackberries, or raspberries. Other fresh and frozen fruits in small amounts. Vegetables Lettuce. Bok choy. Eggplant. Tomatoes. Cucumbers. Peppers. Cauliflower. Zucchini. Fennel. Swiss chard. Grains Almond, hazelnut, or coconut flours. Meats and other proteins Meat, poultry, and fish. Bacon. Eggs. Egg substitutes. Nuts, nut butters (without added sugar), seeds, lentils, and split peas in small amounts. Dairy Cheese in moderate amounts. Beverages Plain or mineral water. Sugar-free, caffeine-free beverages. Club soda. Caffeine-free, carbohydrate-free herbal tea. Fats and oils Avocado. Cream. Sour cream. Cream cheese. Butter. Plant-based oils, such as olive, canola, and sunflower. Margarine. Mayonnaise. Sweets and desserts Any homemade sweets or desserts made using ketogenic diet recipes. The items listed above may not be a complete  list of recommended  foods and beverages. Contact a dietitian for more information. What foods should I avoid? Fruits Fruit juice. Fruits packed in syrups. Dried or candied fruits. Vegetables Corn. Potatoes (all kinds). Peas. Grains All bread, dry cereals, and cooked cereals with added sugar. Baked goods. Crackers and pretzels. Meats and other proteins Meat, poultry, or fish prepared with flour or breading. Nut butters with added sugar. Beans. Dairy Milk. Yogurt. Fats and oils Salad dressings with added sugar. Gravies. Beverages Sugar-sweetened teas, coffee drinks, or soft drinks. Juice. Sports drinks. Sweets and desserts All sweets and desserts, unless the dessert is homemade using ketogenic diet recipes. The items listed above may not be a complete list of foods and beverages to avoid. Contact a dietitian for more information. Summary A ketogenic eating plan is a diet that is very low in carbohydrates, moderately low in protein, and very high in fat. Aim for a daily meal and snack schedule that you can follow steadily. Buy a gram scale to weigh foods. This is needed to follow this diet correctly. Your dietitian will teach you how to use it. Work closely with your health care provider and a dietitian while you are following a ketogenic eating plan. This information is not intended to replace advice given to you by your health care provider. Make sure you discuss any questions you have with your health care provider. Document Revised: 08/31/2019 Document Reviewed: 08/31/2019 Elsevier Patient Education  [place].

## 2021-02-20 NOTE — Progress Notes (Signed)
Complete Physical  Assessment and Plan:  Diagnoses and all orders for this visit:  Encounter for routine adult health examination without abnormal findings She will follow up with GYN for pelvic/breasts  Yearly  Intrinsic asthma Well controlled, avoid triggers, continue inhalers Albuterol inhaler refilled today  Prolactin secreting Pituitary Adenoma (1997) Followed by Dr. Dwyane Dee, responded well to cabogoline -  Check protactin, will refer back if elevated   Multiple food allergies Avoid triggers, doing well on OTC agents  Intraductal papilloma Continue annual mammograms Established with Dr. Donne Hazel to follow up   HX of seasonal allergies Continue OTC allergy pills  Mixed hyperlipidemia Mild elevations currently treated by lifestyle modification only Continue low cholesterol diet and exercise.  Check lipid panel.  CMP TSH  Screening for diabetes mellitus A1C  Medication Management CBC Magnesium  Screening for proteinuria or hematuria Routine urine with reflex microscopic Microalbumin/creatinine urine ratio  Screening for ischemic heart disease EKG  Morbid obesity Long discussion about weight loss, diet, and exercise Recommended diet heavy in fruits and veggies and low in animal meats, cheeses, and dairy products, appropriate calorie intake Discussed appropriate weight for height  Discussed decreasing and stopping soda and sweet tea. Encourage increasing exercise  Multiple Food Allergies Epipen sent to pharmacy    Discussed med's effects and SE's. Screening labs and tests as requested with regular follow-up as recommended. Over 40 minutes of exam, counseling, chart review, and complex, high level critical decision making was performed this visit.   Future Appointments  Date Time Provider [place]  02/20/2021  3:00 PM Magda Bernheim, NP [facility] None  02/23/2022  3:00 PM Magda Bernheim, NP [facility] None     HPI  50 y.o. AA female,  presents for a complete physical and follow up for has Intrinsic asthma; Multiple food allergies; Seasonal allergies; Prolactin secreting Pituitary Adenoma (1997); Intraductal papilloma; Hyperlipidemia; Morbid obesity with BMI of 40.0-44.9, adult ([place]); Vitamin D deficiency; Insulin resistance; and COVID-19 (04/30/2020) on their problem list.    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 Has good range of motion with left ankle.   Recent follow up MRI for pituitary adenoma . Had biopsy of left and right which showed intraductal papilloma. Prolactin 02/2020 93.0. Pt saw Dr. Dwyane Dee 02/19/20- has benign 77mm pituitary adenoma He prescribed Dostinex 0.25mg  twice a weeks and wanted her to follow up in 2 months. Recommend would take until menopause. Right and left central duct removal 05/23/20 by Dr. Donne Hazel.   She has multiple allergies and mild intermittent asthma, zyrtec, flonase, off of breo, just albuterol PRN and continues to do well with this, arely needing except during the fall.    BMI is Body mass index is 43.46 kg/m., she has been working on diet and exercise,  Noticing more weight gain.  Unable to take Phentermine due to elevated heart rate and blood pressure. Has been eating at home more and doing better, increasing water, fruit/veggies, reducing carbs/sweets, keeping out of the house. She is drinking more regular soda,sweet tea and french fries.  She walks a lot, on her feet all day and walking at work, wants to start tracking steps Wt Readings from Last 3 Encounters:  02/20/21 253 lb 3.2 oz (114.9 kg)  05/23/20 247 lb 2.2 oz (112.1 kg)  03/06/20 249 lb (  112.9 kg)   Her blood pressure has been controlled at home, today their BP is BP: 110/70 She does workout. She denies chest pain, shortness  of breath, dizziness.   She is not on cholesterol medication and denies myalgias. Her cholesterol is not at goal. The cholesterol last visit was:   Lab Results  Component Value Date   CHOL 178 02/05/2020   HDL 57 02/05/2020   LDLCALC 100 (H) 02/05/2020   TRIG 116 02/05/2020   CHOLHDL 3.1 02/05/2020   Last A1C in the office was:  Lab Results  Component Value Date   HGBA1C 5.6 02/05/2020   Last GFR: Lab Results  Component Value Date   GFRAA 81 02/05/2020   Patient is on Vitamin D supplement, 5000 IU daily    Lab Results  Component Value Date   VD25OH 21 (L) 02/05/2020        Current Medications:  Current Outpatient Medications on File Prior to Visit  Medication Sig Dispense Refill   albuterol (VENTOLIN HFA) 108 (90 Base) MCG/ACT inhaler 1 to 2 puffs (5 minutes apart) every 4 hour as needed for Asthma 18 g 1   aspirin EC 81 MG tablet 1 po bid 60 tablet 11   Azelastine HCl 137 MCG/SPRAY SOLN PLACE 2 SPRAYS INTO BOTH NOSTRILS 2 (TWO) TIMES DAILY. USE IN EACH NOSTRIL AS DIRECTED 30 mL 1   budesonide-formoterol (SYMBICORT) 80-4.5 MCG/ACT inhaler Inhale 2 puffs into the lungs in the morning and at bedtime. Rinse mouth after use. 1 each 12   cabergoline (DOSTINEX) 0.5 MG tablet TAKE 1/2 OF A TABLET (0.25MG  TOTAL) BY MOUTH 2 TIMES A WEEK. 10 tablet 0   calcium carbonate (TUMS EX) 750 MG chewable tablet Chew 2 tablets by mouth daily.     cetirizine (ZYRTEC) 10 MG tablet Take 1 tablet (10 mg total) by mouth daily. 90 tablet 1   cholecalciferol (VITAMIN D) 1000 UNITS tablet Take 1,000 Units by mouth daily.     fluticasone (FLONASE) 50 MCG/ACT nasal spray Place 1 spray into both nostrils daily for 3 days. 1 g 0   ibuprofen (ADVIL,MOTRIN) 800 MG tablet Take 1 tablet (800 mg total) by mouth 3 (three) times daily. (Patient taking differently: 800 mg as needed.) 21 tablet 0   Multiple Vitamin (MULTIVITAMIN) tablet Take 1 tablet by mouth daily.     phentermine (ADIPEX-P) 37.5 MG tablet Take  1/2 to 1 tablet every morning for dieting & weightloss 30 tablet 0   traMADol (ULTRAM) 50 MG tablet Take 1 tablet (50 mg total) by mouth every 6 (six) hours as needed. 20 tablet 0   No current facility-administered medications on file prior to visit.   Allergies:  Allergies  Allergen Reactions   Banana Hives and Shortness Of Breath   Peanut-Containing Drug Products Hives and Shortness Of Breath    All nuts (allergic) hives    Shellfish Allergy Hives and Shortness Of Breath   Chlorhexidine Hives   Medical History:  She has Intrinsic asthma; Multiple food allergies; Seasonal allergies; Prolactin secreting Pituitary Adenoma (1997); Intraductal papilloma; Hyperlipidemia; Morbid obesity with BMI of 40.0-44.9, adult ([place]); Vitamin D deficiency; Insulin resistance; and COVID-19 (04/30/2020) on their problem list. Health Maintenance:   Immunization History  Administered Date(s) Administered   Influenza Inj Mdck Quad With Preservative 03/06/2020   Moderna Sars-Covid-2 Vaccination 08/03/2019, 09/05/2019   Tdap 10/19/2017    Tetanus: 2019 Pneumovax:-  Prevnar 13: - Flu vaccine: declines  Zostavax: n/a  Covid 19: 2/2, 2021, moderna  LMP: No LMP recorded.  Pap: Remote, never sexually active, will follow up with GYN, has number to schedule MGM: 01/29/2020 bil diagnostic, MRI 03/2020 DEXA: n/a  Colonoscopy: never, due age 80 EGD: -  Last Dental Exam: Dr. ?, last 3 years ago, will schedule  Last Eye Exam: 2021, wears glasses  Patient Care Team: Unk Pinto, MD as PCP - General (Internal Medicine)  Surgical History:  She has a past surgical history that includes Tonsillectomy; Radioactive seed guided excisional breast biopsy (Left, 07/26/2017); Breast ductal system excision (Left, 07/26/2017); right breast biopsy (Right, 2019); Radioactive seed guided excisional breast biopsy (Right, 06/16/2018); and Breast ductal system excision (Bilateral, 05/23/2020). Family History:  Herfamily  history includes Bladder Cancer in her mother and paternal grandmother; Breast cancer in her paternal grandmother; Diabetes in her maternal grandfather and paternal grandmother; Drug abuse in her mother; Hypertension in her maternal grandfather and mother; Lung cancer in her maternal grandmother; Myopathy in her father; Rheum arthritis in her maternal grandmother; Spina bifida in her mother. Social History:  She reports that she has never smoked. She has never used smokeless tobacco. She reports that she does not drink alcohol and does not use drugs.  Review of Systems: Review of Systems  Constitutional:  Negative for chills, fever, malaise/fatigue and weight loss.  HENT:  Negative for congestion, hearing loss, sinus pain, sore throat and tinnitus.   Eyes:  Negative for blurred vision and double vision.  Respiratory:  Negative for cough, hemoptysis, sputum production, shortness of breath and wheezing.   Cardiovascular:  Negative for chest pain, palpitations, orthopnea, claudication, leg swelling and PND.  Gastrointestinal:  Negative for abdominal pain, blood in stool, constipation, diarrhea, heartburn, melena, nausea and vomiting.  Genitourinary: Negative.  Negative for dysuria and urgency.  Musculoskeletal:  Negative for back pain, falls, joint pain, myalgias and neck pain.  Skin:  Negative for rash.  Neurological:  Negative for dizziness, tingling, tremors, sensory change, weakness and headaches.  Endo/Heme/Allergies:  Negative for polydipsia. Does not bruise/bleed easily.  Psychiatric/Behavioral: Negative.  Negative for depression, memory loss, substance abuse and suicidal ideas. The patient is not nervous/anxious and does not have insomnia.   All other systems reviewed and are negative.  Physical Exam: Estimated body mass index is 43.46 kg/m as calculated from the following:   Height as of this encounter: 5\' 4"  (1.626 m).   Weight as of this encounter: 253 lb 3.2 oz (114.9 kg). BP 110/70    Pulse 81   Temp 97.9 F (36.6 C)   Ht 5\' 4"  (1.626 m)   Wt 253 lb 3.2 oz (114.9 kg)   SpO2 97%   BMI 43.46 kg/m  General Appearance: Very [pleasant obese female in no apparent distress.  Eyes: PERRLA, EOMs, conjunctiva no swelling or erythema Sinuses: No Frontal/maxillary tenderness  ENT/Mouth: Ext aud canals clear, normal light reflex with TMs without erythema, bulging. Good dentition. No erythema, swelling, or exudate on post pharynx. Tonsils not swollen or erythematous. Hearing normal.  Neck: Supple, thyroid normal. No bruits  Respiratory: Respiratory effort normal, BS equal bilaterally without rales, rhonchi, wheezing or stridor.  Cardio: RRR without murmurs, rubs or gallops. Brisk peripheral pulses without edema.  Chest: symmetric, with normal excursions and percussion.  Breasts: Defer, just had extensively at breast center; biopsy today Abdomen: Soft, nontender, no guarding, rebound, hernias, masses, or organomegaly.  Lymphatics: Non tender without lymphadenopathy.  Genitourinary: Defer to GYN, will schedule soon  Musculoskeletal: Full ROM all peripheral extremities,5/5 strength, and normal gait.  Skin: Warm, dry without rashes, lesions, ecchymosis. Neuro: Cranial nerves intact, reflexes equal bilaterally. Normal muscle tone, no cerebellar symptoms. Sensation intact.  Psych: Awake and oriented X 3, normal affect, Insight and Judgment appropriate.   EKG: Normal Sinus rhythm  Wanda Douglas W Wanda Douglas 2:58 PM [facility]

## 2021-02-21 LAB — LIPID PANEL
Cholesterol: 189 mg/dL (ref <200)
HDL: 61 mg/dL (ref >=50)
LDL Cholesterol (Calc): 110 mg/dL (calc) — ABNORMAL HIGH
Non-HDL Cholesterol (Calc): 128 mg/dL (calc) (ref <130)
Total CHOL/HDL Ratio: 3.1 (calc) (ref <5.0)
Triglycerides: 87 mg/dL (ref <150)

## 2021-02-21 LAB — CBC WITH DIFFERENTIAL/PLATELET
Absolute Monocytes: 706 cells/uL (ref 200–950)
Basophils Absolute: 33 cells/uL (ref 0–200)
Basophils Relative: 0.4 %
Eosinophils Absolute: 191 cells/uL (ref 15–500)
Eosinophils Relative: 2.3 %
HCT: 38.6 % (ref 35.0–45.0)
Hemoglobin: 12.3 g/dL (ref 11.7–15.5)
Lymphs Abs: 3337 cells/uL (ref 850–3900)
MCH: 26.7 pg — ABNORMAL LOW (ref 27.0–33.0)
MCHC: 31.9 g/dL — ABNORMAL LOW (ref 32.0–36.0)
MCV: 83.9 fL (ref 80.0–100.0)
MPV: 11.1 fL (ref 7.5–12.5)
Monocytes Relative: 8.5 %
Neutro Abs: 4034 cells/uL (ref 1500–7800)
Neutrophils Relative %: 48.6 %
Platelets: 228 10*3/uL (ref 140–400)
RBC: 4.6 10*6/uL (ref 3.80–5.10)
RDW: 13.3 % (ref 11.0–15.0)
Total Lymphocyte: 40.2 %
WBC: 8.3 10*3/uL (ref 3.8–10.8)

## 2021-02-21 LAB — MICROSCOPIC MESSAGE

## 2021-02-21 LAB — COMPLETE METABOLIC PANEL WITH GFR
AG Ratio: 1.2 (calc) (ref 1.0–2.5)
ALT: 15 U/L (ref 6–29)
AST: 17 U/L (ref 10–35)
Albumin: 3.7 g/dL (ref 3.6–5.1)
Alkaline phosphatase (APISO): 83 U/L (ref 31–125)
BUN: 14 mg/dL (ref 7–25)
CO2: 27 mmol/L (ref 20–32)
Calcium: 8.8 mg/dL (ref 8.6–10.2)
Chloride: 106 mmol/L (ref 98–110)
Creat: 0.89 mg/dL (ref 0.50–0.99)
Globulin: 3 g/dL (calc) (ref 1.9–3.7)
Glucose, Bld: 83 mg/dL (ref 65–99)
Potassium: 4 mmol/L (ref 3.5–5.3)
Sodium: 140 mmol/L (ref 135–146)
Total Bilirubin: 0.3 mg/dL (ref 0.2–1.2)
Total Protein: 6.7 g/dL (ref 6.1–8.1)
eGFR: 79 mL/min/{1.73_m2} (ref >=60)

## 2021-02-21 LAB — MICROALBUMIN / CREATININE URINE RATIO
Creatinine, Urine: 35 mg/dL (ref 20–275)
Microalb, Ur: <0.2 mg/dL

## 2021-02-21 LAB — URINALYSIS, ROUTINE W REFLEX MICROSCOPIC
Bacteria, UA: NONE SEEN /HPF
Bilirubin Urine: NEGATIVE
Glucose, UA: NEGATIVE
Hgb urine dipstick: NEGATIVE
Hyaline Cast: NONE SEEN /LPF
Ketones, ur: NEGATIVE
Nitrite: NEGATIVE
Protein, ur: NEGATIVE
RBC / HPF: NONE SEEN /HPF (ref 0–2)
Specific Gravity, Urine: 1.006 (ref 1.001–1.035)
Squamous Epithelial / HPF: NONE SEEN /HPF (ref <=5)
pH: 6 (ref 5.0–8.0)

## 2021-02-21 LAB — HEMOGLOBIN A1C
Hgb A1c MFr Bld: 5.4 % of total Hgb (ref <5.7)
Mean Plasma Glucose: 108 mg/dL
eAG (mmol/L): 6 mmol/L

## 2021-02-21 LAB — PROLACTIN: Prolactin: 50.9 ng/mL — ABNORMAL HIGH

## 2021-02-21 LAB — TSH: TSH: 0.56 mIU/L

## 2021-02-21 LAB — MAGNESIUM: Magnesium: 1.8 mg/dL (ref 1.5–2.5)

## 2021-02-21 LAB — VITAMIN D 25 HYDROXY (VIT D DEFICIENCY, FRACTURES): Vit D, 25-Hydroxy: 41 ng/mL (ref 30–100)

## 2022-02-20 NOTE — Progress Notes (Deleted)
 Complete Physical  Assessment and Plan:  Diagnoses and all orders for this visit:  Encounter for routine adult health examination without abnormal findings She will follow up with GYN for pelvic/breasts  Yearly  Intrinsic asthma Well controlled, avoid triggers, continue inhalers Albuterol inhaler refilled today  Prolactin secreting Pituitary Adenoma (1997) Followed by Dr. Dwyane Dee, responded well to cabogoline -  Check protactin, will refer back if elevated   Multiple food allergies Avoid triggers, doing well on OTC agents  Intraductal papilloma Continue annual mammograms Established with Dr. Donne Hazel to follow up   HX of seasonal allergies Continue OTC allergy pills  Mixed hyperlipidemia Mild elevations currently treated by lifestyle modification only Continue low cholesterol diet and exercise.  Check lipid panel.  CMP TSH  Screening for diabetes mellitus A1C  Medication Management CBC Magnesium  Screening for proteinuria or hematuria Routine urine with reflex microscopic Microalbumin/creatinine urine ratio  Screening for ischemic heart disease EKG  Morbid obesity Long discussion about weight loss, diet, and exercise Recommended diet heavy in fruits and veggies and low in animal meats, cheeses, and dairy products, appropriate calorie intake Discussed appropriate weight for height  Discussed decreasing and stopping soda and sweet tea. Encourage increasing exercise  Multiple Food Allergies Epipen sent to pharmacy No orders of the defined types were placed in this encounter.   Discussed med's effects and SE's. Screening labs and tests as requested with regular follow-up as recommended. Over 40 minutes of exam, counseling, chart review, and complex, high level critical decision making was performed this visit.   Future Appointments  Date Time Provider [place]  02/23/2022  3:00 PM Alycia Rossetti, NP [facility] None  02/24/2023  3:00 PM  Alycia Rossetti, NP [facility] None     HPI  51 y.o. AA female, presents for a complete physical and follow up for has Intrinsic asthma; Multiple food allergies; Seasonal allergies; Prolactin secreting Pituitary Adenoma (1997); Intraductal papilloma; Hyperlipidemia; Morbid obesity with BMI of 40.0-44.9, adult ([place]); Vitamin D deficiency; Insulin resistance; and COVID-19 (04/30/2020) on their problem list.    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 Has good range of motion with left ankle.   Recent follow up MRI for pituitary adenoma . Had biopsy of left and right which showed intraductal papilloma. Prolactin 02/2020 93.0. Pt saw Dr. Dwyane Dee 02/19/20- has benign 71m pituitary adenoma He prescribed Dostinex 0.'25mg'$  twice a weeks and wanted her to follow up in 2 months. Recommend would take until menopause. Right and left central duct removal 05/23/20 by Dr. WDonne Hazel   She has multiple allergies and mild intermittent asthma, zyrtec, flonase, off of breo, just albuterol PRN and continues to do well with this, arely needing except during the fall.    BMI is There is no height or weight on file to calculate BMI., she has been working on diet and exercise,  Noticing more weight gain.  Unable to take Phentermine due to elevated heart rate and blood pressure. Has been eating at home more and doing better, increasing water, fruit/veggies, reducing carbs/sweets, keeping out of the house. She is drinking more regular soda,sweet tea and french fries.  She walks a lot, on her feet all day and walking at work, wants to start tracking steps Wt Readings from Last 3 Encounters:  02/20/21 253 lb 3.2  oz (114.9 kg)  05/23/20 247 lb 2.2 oz (112.1 kg)  03/06/20 249 lb (112.9 kg)   Her blood pressure has been  controlled at home, today their BP is   She does workout. She denies chest pain, shortness of breath, dizziness.   She is not on cholesterol medication and denies myalgias. Her cholesterol is not at goal. The cholesterol last visit was:   Lab Results  Component Value Date   CHOL 189 02/20/2021   HDL 61 02/20/2021   LDLCALC 110 (H) 02/20/2021   TRIG 87 02/20/2021   CHOLHDL 3.1 02/20/2021   Last A1C in the office was:  Lab Results  Component Value Date   HGBA1C 5.4 02/20/2021   Last GFR: Lab Results  Component Value Date   GFRAA 81 02/05/2020   Patient is on Vitamin D supplement, 5000 IU daily    Lab Results  Component Value Date   VD25OH 82 02/20/2021        Current Medications:  Current Outpatient Medications on File Prior to Visit  Medication Sig Dispense Refill   albuterol (VENTOLIN HFA) 108 (90 Base) MCG/ACT inhaler 1 to 2 puffs (5 minutes apart) every 4 hour as needed for Asthma 18 g 1   Azelastine HCl 137 MCG/SPRAY SOLN PLACE 2 SPRAYS INTO BOTH NOSTRILS 2 (TWO) TIMES DAILY. USE IN EACH NOSTRIL AS DIRECTED 30 mL 1   budesonide-formoterol (SYMBICORT) 80-4.5 MCG/ACT inhaler Inhale 2 puffs into the lungs in the morning and at bedtime. Rinse mouth after use. 1 each 12   calcium carbonate (TUMS EX) 750 MG chewable tablet Chew 2 tablets by mouth daily.     cetirizine (ZYRTEC) 10 MG tablet Take 1 tablet (10 mg total) by mouth daily. 90 tablet 1   cholecalciferol (VITAMIN D) 1000 UNITS tablet Take 1,000 Units by mouth daily.     fluticasone (FLONASE) 50 MCG/ACT nasal spray Place 1 spray into both nostrils daily for 3 days. 1 g 0   Multiple Vitamin (MULTIVITAMIN) tablet Take 1 tablet by mouth daily.     Current Facility-Administered Medications on File Prior to Visit  Medication Dose Route Frequency Provider Last Rate Last Admin   EPINEPHrine (EPI-PEN) injection 0.3 mg  0.3 mg Intramuscular Once Alycia Rossetti, NP       Allergies:  Allergies  Allergen Reactions   Banana  Hives and Shortness Of Breath   Peanut-Containing Drug Products Hives and Shortness Of Breath    All nuts (allergic) hives    Shellfish Allergy Hives and Shortness Of Breath   Chlorhexidine Hives   Medical History:  She has Intrinsic asthma; Multiple food allergies; Seasonal allergies; Prolactin secreting Pituitary Adenoma (1997); Intraductal papilloma; Hyperlipidemia; Morbid obesity with BMI of 40.0-44.9, adult ([place]); Vitamin D deficiency; Insulin resistance; and COVID-19 (04/30/2020) on their problem list. Health Maintenance:   Immunization History  Administered Date(s) Administered   Influenza Inj Mdck Quad With Preservative 03/06/2020   Influenza,inj,Quad PF,6+ Mos 02/20/2021   Moderna Sars-Covid-2 Vaccination 08/03/2019, 09/05/2019   Tdap 10/19/2017    Tetanus: 2019 Pneumovax:-  Prevnar 13: - Flu vaccine: declines  Zostavax: n/a  Covid 19: 2/2, 2021, moderna  LMP: No LMP recorded.  Pap: Remote, never sexually active, will follow up with GYN, has number to schedule MGM: 01/29/2020 bil diagnostic, MRI 03/2020 DEXA: n/a  Colonoscopy: never, due age 87 EGD: -  Last Dental Exam: Dr. ?, last 3 years ago, will schedule  Last Eye Exam: 2021, wears glasses  Patient Care Team: Unk Pinto,  MD as PCP - General (Internal Medicine)  Surgical History:  She has a past surgical history that includes Tonsillectomy; Radioactive seed guided excisional breast biopsy (Left, 07/26/2017); Breast ductal system excision (Left, 07/26/2017); right breast biopsy (Right, 2019); Radioactive seed guided excisional breast biopsy (Right, 06/16/2018); and Breast ductal system excision (Bilateral, 05/23/2020). Family History:  Herfamily history includes Bladder Cancer in her mother and paternal grandmother; Breast cancer in her paternal grandmother; Diabetes in her maternal grandfather and paternal grandmother; Drug abuse in her mother; Hypertension in her maternal grandfather and mother; Lung cancer in  her maternal grandmother; Myopathy in her father; Rheum arthritis in her maternal grandmother; Spina bifida in her mother. Social History:  She reports that she has never smoked. She has never used smokeless tobacco. She reports that she does not drink alcohol and does not use drugs.  Review of Systems: Review of Systems  Constitutional:  Negative for chills, fever, malaise/fatigue and weight loss.  HENT:  Negative for congestion, hearing loss, sinus pain, sore throat and tinnitus.   Eyes:  Negative for blurred vision and double vision.  Respiratory:  Negative for cough, hemoptysis, sputum production, shortness of breath and wheezing.   Cardiovascular:  Negative for chest pain, palpitations, orthopnea, claudication, leg swelling and PND.  Gastrointestinal:  Negative for abdominal pain, blood in stool, constipation, diarrhea, heartburn, melena, nausea and vomiting.  Genitourinary: Negative.  Negative for dysuria and urgency.  Musculoskeletal:  Negative for back pain, falls, joint pain, myalgias and neck pain.  Skin:  Negative for rash.  Neurological:  Negative for dizziness, tingling, tremors, sensory change, weakness and headaches.  Endo/Heme/Allergies:  Negative for polydipsia. Does not bruise/bleed easily.  Psychiatric/Behavioral: Negative.  Negative for depression, memory loss, substance abuse and suicidal ideas. The patient is not nervous/anxious and does not have insomnia.   All other systems reviewed and are negative.   Physical Exam: Estimated body mass index is 43.46 kg/m as calculated from the following:   Height as of 02/20/21: '5\' 4"'$  (1.626 m).   Weight as of 02/20/21: 253 lb 3.2 oz (114.9 kg). There were no vitals taken for this visit. General Appearance: Very [pleasant obese female in no apparent distress.  Eyes: PERRLA, EOMs, conjunctiva no swelling or erythema Sinuses: No Frontal/maxillary tenderness  ENT/Mouth: Ext aud canals clear, normal light reflex with TMs without  erythema, bulging. Good dentition. No erythema, swelling, or exudate on post pharynx. Tonsils not swollen or erythematous. Hearing normal.  Neck: Supple, thyroid normal. No bruits  Respiratory: Respiratory effort normal, BS equal bilaterally without rales, rhonchi, wheezing or stridor.  Cardio: RRR without murmurs, rubs or gallops. Brisk peripheral pulses without edema.  Chest: symmetric, with normal excursions and percussion.  Breasts: Defer, just had extensively at breast center; biopsy today Abdomen: Soft, nontender, no guarding, rebound, hernias, masses, or organomegaly.  Lymphatics: Non tender without lymphadenopathy.  Genitourinary: Defer to GYN, will schedule soon  Musculoskeletal: Full ROM all peripheral extremities,5/5 strength, and normal gait.  Skin: Warm, dry without rashes, lesions, ecchymosis. Neuro: Cranial nerves intact, reflexes equal bilaterally. Normal muscle tone, no cerebellar symptoms. Sensation intact.  Psych: Awake and oriented X 3, normal affect, Insight and Judgment appropriate.   EKG: Normal Sinus rhythm  Kirin Pastorino E  10:54 PM [facility]

## 2022-02-23 ENCOUNTER — Encounter: Payer: Self-pay | Admitting: Nurse Practitioner

## 2022-02-23 DIAGNOSIS — Z136 Encounter for screening for cardiovascular disorders: Secondary | ICD-10-CM

## 2022-02-23 DIAGNOSIS — Z1329 Encounter for screening for other suspected endocrine disorder: Secondary | ICD-10-CM

## 2022-02-23 DIAGNOSIS — Z1389 Encounter for screening for other disorder: Secondary | ICD-10-CM

## 2022-02-23 DIAGNOSIS — E559 Vitamin D deficiency, unspecified: Secondary | ICD-10-CM

## 2022-02-23 DIAGNOSIS — Z79899 Other long term (current) drug therapy: Secondary | ICD-10-CM

## 2022-02-23 DIAGNOSIS — D352 Benign neoplasm of pituitary gland: Secondary | ICD-10-CM

## 2022-02-23 DIAGNOSIS — D369 Benign neoplasm, unspecified site: Secondary | ICD-10-CM

## 2022-02-23 DIAGNOSIS — R7309 Other abnormal glucose: Secondary | ICD-10-CM

## 2022-02-23 DIAGNOSIS — J45909 Unspecified asthma, uncomplicated: Secondary | ICD-10-CM

## 2022-02-23 DIAGNOSIS — Z0001 Encounter for general adult medical examination with abnormal findings: Secondary | ICD-10-CM

## 2022-02-23 DIAGNOSIS — Z91018 Allergy to other foods: Secondary | ICD-10-CM

## 2022-02-23 DIAGNOSIS — E782 Mixed hyperlipidemia: Secondary | ICD-10-CM

## 2022-02-23 DIAGNOSIS — J302 Other seasonal allergic rhinitis: Secondary | ICD-10-CM

## 2022-03-30 ENCOUNTER — Ambulatory Visit (INDEPENDENT_AMBULATORY_CARE_PROVIDER_SITE_OTHER): Payer: Self-pay | Admitting: Nurse Practitioner

## 2022-03-30 ENCOUNTER — Encounter: Payer: Self-pay | Admitting: Nurse Practitioner

## 2022-03-30 VITALS — BP 112/76 | HR 83 | Temp 97.5°F | Ht 64.0 in | Wt 257.0 lb

## 2022-03-30 DIAGNOSIS — Z1389 Encounter for screening for other disorder: Secondary | ICD-10-CM

## 2022-03-30 DIAGNOSIS — D369 Benign neoplasm, unspecified site: Secondary | ICD-10-CM

## 2022-03-30 DIAGNOSIS — Z01419 Encounter for gynecological examination (general) (routine) without abnormal findings: Secondary | ICD-10-CM

## 2022-03-30 DIAGNOSIS — D352 Benign neoplasm of pituitary gland: Secondary | ICD-10-CM

## 2022-03-30 DIAGNOSIS — J302 Other seasonal allergic rhinitis: Secondary | ICD-10-CM

## 2022-03-30 DIAGNOSIS — E559 Vitamin D deficiency, unspecified: Secondary | ICD-10-CM

## 2022-03-30 DIAGNOSIS — Z91018 Allergy to other foods: Secondary | ICD-10-CM

## 2022-03-30 DIAGNOSIS — B351 Tinea unguium: Secondary | ICD-10-CM

## 2022-03-30 DIAGNOSIS — Z1211 Encounter for screening for malignant neoplasm of colon: Secondary | ICD-10-CM

## 2022-03-30 DIAGNOSIS — Z0001 Encounter for general adult medical examination with abnormal findings: Secondary | ICD-10-CM

## 2022-03-30 DIAGNOSIS — Z23 Encounter for immunization: Secondary | ICD-10-CM

## 2022-03-30 DIAGNOSIS — R7309 Other abnormal glucose: Secondary | ICD-10-CM

## 2022-03-30 DIAGNOSIS — Z1329 Encounter for screening for other suspected endocrine disorder: Secondary | ICD-10-CM

## 2022-03-30 DIAGNOSIS — Z79899 Other long term (current) drug therapy: Secondary | ICD-10-CM

## 2022-03-30 DIAGNOSIS — J45909 Unspecified asthma, uncomplicated: Secondary | ICD-10-CM

## 2022-03-30 DIAGNOSIS — I10 Essential (primary) hypertension: Secondary | ICD-10-CM

## 2022-03-30 DIAGNOSIS — E782 Mixed hyperlipidemia: Secondary | ICD-10-CM

## 2022-03-30 DIAGNOSIS — Z136 Encounter for screening for cardiovascular disorders: Secondary | ICD-10-CM

## 2022-03-30 DIAGNOSIS — Z0000 Encounter for general adult medical examination without abnormal findings: Secondary | ICD-10-CM

## 2022-03-30 MED ORDER — PHENDIMETRAZINE TARTRATE 35 MG PO TABS: ORAL_TABLET | ORAL | 0 refills | Status: DC

## 2022-03-30 MED ORDER — CETIRIZINE HCL 10 MG PO TABS: 10.0000 mg | ORAL_TABLET | Freq: Every day | ORAL | 1 refills | Status: AC

## 2022-03-30 MED ORDER — ALBUTEROL SULFATE HFA 108 (90 BASE) MCG/ACT IN AERS: INHALATION_SPRAY | RESPIRATORY_TRACT | 1 refills | Status: DC

## 2022-03-30 MED ORDER — EPINEPHRINE 0.3 MG/0.3ML IJ SOAJ: 0.3000 mg | INTRAMUSCULAR | 1 refills | Status: DC | PRN

## 2022-03-30 MED ORDER — CICLOPIROX 8 % EX SOLN: Freq: Every day | CUTANEOUS | 0 refills | Status: DC

## 2022-03-30 NOTE — Progress Notes (Signed)
 Complete Physical  Assessment and Plan:  Diagnoses and all orders for this visit:  Encounter for routine adult health examination without abnormal findings She is requesting referral to GYN Yearly  Intrinsic asthma Well controlled, avoid triggers, continue inhalers Albuterol inhaler refilled today  Prolactin secreting Pituitary Adenoma (1997) Followed by Dr. Dwyane Dee, responded well to cabogoline -  Check protactin, will refer back if elevated   Multiple food allergies Avoid triggers, doing well on OTC agents  Intraductal papilloma Continue annual mammograms Established with Dr. Donne Hazel to follow up   HX of seasonal allergies Continue OTC allergy pills  Mixed hyperlipidemia Mild elevations currently treated by lifestyle modification only Continue low cholesterol diet and exercise.  Check lipid panel.  CMP TSH  Abnormal Glucose Continue diet and exercise A1C  Medication Management CBC   Screening for proteinuria or hematuria Routine urine with Reflex urine culture Microalbumin/creatinine urine ratio  Screening for ischemic heart disease EKG  Screening for AAA - ABD U/S RETROPERITONEAL LTD  Flu Vaccine Need Flu Quad 6 MOS + PF IM  Morbid obesity Long discussion about weight loss, diet, and exercise Fair life protein shakes Eat more frequently - try not to go more than 6 hours without protein Aim for 90 grams of protein a day- 30 breakfast/30 lunch 30 dinner Try to keep net carbs less than 50 Net Carbs=Total Carbs-fiber- sugar alcohols Exercise heartrate 120-140(fat burning zone)- walking 20-30 minutes 4 days a week Phendimetrazine 1/2-1 tab twice a day as needed for appetite control, notify office if develops chest pain, shortness of breath or palpitations   Multiple Food Allergies Epipen sent to pharmacy  Refer to gastroenterology - Colonoscopy  Refer to GYN - Pelvic exam  Onychomycosis - Penlac polish BID  Discussed med's effects and SE's.  Screening labs and tests as requested with regular follow-up as recommended. Over 40 minutes of exam, counseling, chart review, and complex, high level critical decision making was performed this visit.   Future Appointments  Date Time Provider [place]  03/31/2023  9:00 AM Alycia Rossetti, NP [facility] None     HPI  51 y.o. AA female, presents for a complete physical and follow up for has Intrinsic asthma; Multiple food allergies; Seasonal allergies; Prolactin secreting Pituitary Adenoma (1997); Intraductal papilloma; Hyperlipidemia; Morbid obesity with BMI of 40.0-44.9, adult ([place]); Vitamin D deficiency; Insulin resistance; and COVID-19 (04/30/2020) on their problem list.    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 She does have worse pain in left ankle and uses Ibuprofen as needed.  Recent follow up MRI for pituitary adenoma . Had biopsy of left and right which showed intraductal papilloma. Prolactin 02/2020 93.0. Pt saw Dr. Dwyane Dee 02/19/20- has benign 73m pituitary adenoma He prescribed Dostinex 0.'25mg'$  twice a weeks and wanted her to follow up in 2 months. Recommend would take until menopause. Right and left central duct removal 05/23/20 by Dr. WDonne Hazel Denies nipple discharge currently has not been following with Dr. KDwyane Deeor taking Dostinex.  Plans to schedule an appointment and will check prolactin today  She does need a referral to GYN for pelvic exam and Gastroenterology for colonoscopy  She has multiple allergies and mild intermittent asthma, zyrtec, flonase, off of breo, just albuterol PRN and continues to do well with this, arely needing except during  the fall.    BMI is Body mass index is 44.11 kg/m., she has been working on diet and exercise,  Noticing more weight gain.  Unable  to take Phentermine due to elevated heart rate and blood pressure. She has tried optavia but it did not work well for her.  Wt Readings from Last 3 Encounters:  03/30/22 257 lb (116.6 kg)  02/20/21 253 lb 3.2 oz (114.9 kg)  05/23/20 247 lb 2.2 oz (112.1 kg)   Her blood pressure has been controlled at home, today their BP is BP: 112/76  BP Readings from Last 3 Encounters:  03/30/22 112/76  02/20/21 110/70  05/23/20 121/83    She does workout. She denies chest pain, shortness of breath, dizziness.   She is not on cholesterol medication and denies myalgias. Her cholesterol is not at goal. The cholesterol last visit was:   Lab Results  Component Value Date   CHOL 189 02/20/2021   HDL 61 02/20/2021   LDLCALC 110 (H) 02/20/2021   TRIG 87 02/20/2021   CHOLHDL 3.1 02/20/2021   Last A1C in the office was:  Lab Results  Component Value Date   HGBA1C 5.4 02/20/2021   Last GFR: Lab Results  Component Value Date   GFRAA 81 02/05/2020   Patient is on Vitamin D supplement, 5000 IU daily    Lab Results  Component Value Date   VD25OH 14 02/20/2021        Current Medications:  Current Outpatient Medications on File Prior to Visit  Medication Sig Dispense Refill   albuterol (VENTOLIN HFA) 108 (90 Base) MCG/ACT inhaler 1 to 2 puffs (5 minutes apart) every 4 hour as needed for Asthma 18 g 1   Azelastine HCl 137 MCG/SPRAY SOLN PLACE 2 SPRAYS INTO BOTH NOSTRILS 2 (TWO) TIMES DAILY. USE IN EACH NOSTRIL AS DIRECTED 30 mL 1   budesonide-formoterol (SYMBICORT) 80-4.5 MCG/ACT inhaler Inhale 2 puffs into the lungs in the morning and at bedtime. Rinse mouth after use. 1 each 12   calcium carbonate (TUMS EX) 750 MG chewable tablet Chew 2 tablets by mouth daily.     cetirizine (ZYRTEC) 10 MG tablet Take 1 tablet (10 mg total) by mouth daily. 90 tablet 1   cholecalciferol (VITAMIN D) 1000 UNITS tablet Take 1,000 Units by mouth daily.     Multiple Vitamin (MULTIVITAMIN) tablet Take 1 tablet by mouth  daily.     fluticasone (FLONASE) 50 MCG/ACT nasal spray Place 1 spray into both nostrils daily for 3 days. 1 g 0   Current Facility-Administered Medications on File Prior to Visit  Medication Dose Route Frequency Provider Last Rate Last Admin   EPINEPHrine (EPI-PEN) injection 0.3 mg  0.3 mg Intramuscular Once Alycia Rossetti, NP       Allergies:  Allergies  Allergen Reactions   Banana Hives and Shortness Of Breath   Peanut-Containing Drug Products Hives and Shortness Of Breath    All nuts (allergic) hives    Shellfish Allergy Hives and Shortness Of Breath   Chlorhexidine Hives   Medical History:  She has Intrinsic asthma; Multiple food allergies; Seasonal allergies; Prolactin secreting Pituitary Adenoma (1997); Intraductal papilloma; Hyperlipidemia; Morbid obesity with BMI of 40.0-44.9, adult ([place]); Vitamin D deficiency; Insulin resistance; and COVID-19 (04/30/2020) on their problem list. Health Maintenance:   Immunization History  Administered Date(s) Administered   Influenza Inj Mdck Quad With Preservative 03/06/2020   Influenza,inj,Quad PF,6+ Mos 02/20/2021   Moderna Sars-Covid-2 Vaccination 08/03/2019, 09/05/2019   Tdap 10/19/2017  Tetanus: 2019 Pneumovax:-  Prevnar 13: - Flu vaccine: declines  Zostavax: n/a  Covid 19: 2/2, 2021, moderna  LMP: Patient's last menstrual period was 12/15/2021.  Pap: Remote, never sexually active, will follow up with GYN, has number to schedule MGM: 01/29/2020 bil diagnostic, MRI 03/2020 DEXA: n/a  Colonoscopy: never, due age 37 EGD: -  Last Dental Exam: Dr. ?, last 3 years ago, will schedule  Last Eye Exam: 2021, wears glasses  Patient Care Team: Unk Pinto, MD as PCP - General (Internal Medicine)  Surgical History:  She has a past surgical history that includes Tonsillectomy; Radioactive seed guided excisional breast biopsy (Left, 07/26/2017); Breast ductal system excision (Left, 07/26/2017); right breast biopsy (Right,  2019); Radioactive seed guided excisional breast biopsy (Right, 06/16/2018); and Breast ductal system excision (Bilateral, 05/23/2020). Family History:  Herfamily history includes Bladder Cancer in her mother and paternal grandmother; Breast cancer in her paternal grandmother; Diabetes in her maternal grandfather and paternal grandmother; Drug abuse in her mother; Hypertension in her maternal grandfather and mother; Lung cancer in her maternal grandmother; Myopathy in her father; Rheum arthritis in her maternal grandmother; Spina bifida in her mother. Social History:  She reports that she has never smoked. She has never used smokeless tobacco. She reports that she does not drink alcohol and does not use drugs.  Review of Systems: Review of Systems  Constitutional:  Negative for chills, fever, malaise/fatigue and weight loss.       Weight gain  HENT:  Negative for congestion, hearing loss, sinus pain, sore throat and tinnitus.   Eyes:  Negative for blurred vision and double vision.  Respiratory:  Negative for cough, hemoptysis, sputum production, shortness of breath and wheezing.   Cardiovascular:  Negative for chest pain, palpitations, orthopnea, claudication, leg swelling and PND.  Gastrointestinal:  Negative for abdominal pain, blood in stool, constipation, diarrhea, heartburn, melena, nausea and vomiting.  Genitourinary: Negative.  Negative for dysuria and urgency.  Musculoskeletal:  Positive for joint pain (knees and ankles). Negative for back pain, falls, myalgias and neck pain.  Skin:  Negative for rash.       Toenail fungus  Neurological:  Negative for dizziness, tingling, tremors, sensory change, weakness and headaches.  Endo/Heme/Allergies:  Negative for polydipsia. Does not bruise/bleed easily.  Psychiatric/Behavioral: Negative.  Negative for depression, memory loss, substance abuse and suicidal ideas. The patient is not nervous/anxious and does not have insomnia.   All other systems  reviewed and are negative.   Physical Exam: Estimated body mass index is 44.11 kg/m as calculated from the following:   Height as of this encounter: '5\' 4"'$  (1.626 m).   Weight as of this encounter: 257 lb (116.6 kg). BP 112/76   Pulse 83   Temp (!) 97.5 F (36.4 C)   Ht '5\' 4"'$  (1.626 m)   Wt 257 lb (116.6 kg)   LMP 12/15/2021   SpO2 96%   BMI 44.11 kg/m  General Appearance: Very pleasant obese female in no apparent distress.  Eyes: PERRLA, EOMs, conjunctiva no swelling or erythema Sinuses: No Frontal/maxillary tenderness  ENT/Mouth: Ext aud canals clear, normal light reflex with TMs without erythema, bulging. Good dentition. No erythema, swelling, or exudate on post pharynx. Tonsils not swollen or erythematous. Hearing normal.  Neck: Supple, thyroid normal. No bruits  Respiratory: Respiratory effort normal, BS equal bilaterally without rales, rhonchi, wheezing or stridor.  Cardio: RRR without murmurs, rubs or gallops. Brisk peripheral pulses without edema.  Chest: symmetric, with normal excursions and percussion.  Breasts: breasts appear normal, no suspicious masses, no skin or nipple changes or axillary nodes.  Abdomen: Soft, nontender, no guarding, rebound, hernias, masses, or organomegaly.  Lymphatics: Non tender without lymphadenopathy.  Genitourinary: Defer to GYN, will schedule soon  Musculoskeletal: Full ROM all peripheral extremities,5/5 strength, and normal gait.  Skin: Warm, dry without rashes, lesions, ecchymosis. Onychomycosis of great toenails Neuro: Cranial nerves intact, reflexes equal bilaterally. Normal muscle tone, no cerebellar symptoms. Sensation intact.  Psych: Awake and oriented X 3, normal affect, Insight and Judgment appropriate.   EKG: Normal Sinus rhythm, no ST changes AAA: < 3 cm   Ashely Joshua E  9:36 AM [facility]

## 2022-03-30 NOTE — Patient Instructions (Signed)
Fair life protein shakes Eat more frequently - try not to go more than 6 hours without protein Aim for 90 grams of protein a day- 30 breakfast/30 lunch 30 dinner Try to keep net carbs less than 50 Net Carbs=Total Carbs-fiber- sugar alcohols Exercise heartrate 120-140(fat burning zone)- walking 20-30 minutes 4 days a week  

## 2022-04-01 LAB — URINE CULTURE
MICRO NUMBER:: 14235855
SPECIMEN QUALITY:: ADEQUATE

## 2022-04-01 LAB — LIPID PANEL
Cholesterol: 203 mg/dL — ABNORMAL HIGH (ref <200)
HDL: 60 mg/dL (ref >=50)
LDL Cholesterol (Calc): 127 mg/dL (calc) — ABNORMAL HIGH
Non-HDL Cholesterol (Calc): 143 mg/dL (calc) — ABNORMAL HIGH (ref <130)
Total CHOL/HDL Ratio: 3.4 (calc) (ref <5.0)
Triglycerides: 70 mg/dL (ref <150)

## 2022-04-01 LAB — COMPLETE METABOLIC PANEL WITH GFR
AG Ratio: 1.2 (calc) (ref 1.0–2.5)
ALT: 14 U/L (ref 6–29)
AST: 14 U/L (ref 10–35)
Albumin: 3.8 g/dL (ref 3.6–5.1)
Alkaline phosphatase (APISO): 102 U/L (ref 37–153)
BUN: 17 mg/dL (ref 7–25)
CO2: 27 mmol/L (ref 20–32)
Calcium: 9.2 mg/dL (ref 8.6–10.4)
Chloride: 105 mmol/L (ref 98–110)
Creat: 0.91 mg/dL (ref 0.50–1.03)
Globulin: 3.3 g/dL (calc) (ref 1.9–3.7)
Glucose, Bld: 89 mg/dL (ref 65–99)
Potassium: 4.1 mmol/L (ref 3.5–5.3)
Sodium: 139 mmol/L (ref 135–146)
Total Bilirubin: 0.4 mg/dL (ref 0.2–1.2)
Total Protein: 7.1 g/dL (ref 6.1–8.1)
eGFR: 76 mL/min/{1.73_m2} (ref >=60)

## 2022-04-01 LAB — URINALYSIS W MICROSCOPIC + REFLEX CULTURE
Bacteria, UA: NONE SEEN /HPF
Bilirubin Urine: NEGATIVE
Glucose, UA: NEGATIVE
Hgb urine dipstick: NEGATIVE
Hyaline Cast: NONE SEEN /LPF
Ketones, ur: NEGATIVE
Nitrites, Initial: NEGATIVE
Protein, ur: NEGATIVE
RBC / HPF: NONE SEEN /HPF (ref 0–2)
Specific Gravity, Urine: 1.014 (ref 1.001–1.035)
WBC, UA: NONE SEEN /HPF (ref 0–5)
pH: 6 (ref 5.0–8.0)

## 2022-04-01 LAB — CBC WITH DIFFERENTIAL/PLATELET
Absolute Monocytes: 734 cells/uL (ref 200–950)
Basophils Absolute: 22 cells/uL (ref 0–200)
Basophils Relative: 0.3 %
Eosinophils Absolute: 223 cells/uL (ref 15–500)
Eosinophils Relative: 3.1 %
HCT: 40.2 % (ref 35.0–45.0)
Hemoglobin: 12.8 g/dL (ref 11.7–15.5)
Lymphs Abs: 2974 cells/uL (ref 850–3900)
MCH: 26.3 pg — ABNORMAL LOW (ref 27.0–33.0)
MCHC: 31.8 g/dL — ABNORMAL LOW (ref 32.0–36.0)
MCV: 82.5 fL (ref 80.0–100.0)
MPV: 11.3 fL (ref 7.5–12.5)
Monocytes Relative: 10.2 %
Neutro Abs: 3247 cells/uL (ref 1500–7800)
Neutrophils Relative %: 45.1 %
Platelets: 223 10*3/uL (ref 140–400)
RBC: 4.87 10*6/uL (ref 3.80–5.10)
RDW: 13.4 % (ref 11.0–15.0)
Total Lymphocyte: 41.3 %
WBC: 7.2 10*3/uL (ref 3.8–10.8)

## 2022-04-01 LAB — MICROALBUMIN / CREATININE URINE RATIO
Creatinine, Urine: 93 mg/dL (ref 20–275)
Microalb, Ur: <0.2 mg/dL

## 2022-04-01 LAB — HEMOGLOBIN A1C
Hgb A1c MFr Bld: 5.8 % of total Hgb — ABNORMAL HIGH (ref <5.7)
Mean Plasma Glucose: 120 mg/dL
eAG (mmol/L): 6.6 mmol/L

## 2022-04-01 LAB — PROLACTIN: Prolactin: 49.8 ng/mL — ABNORMAL HIGH

## 2022-04-01 LAB — TSH: TSH: 0.77 mIU/L

## 2022-04-01 LAB — CULTURE INDICATED

## 2022-04-06 ENCOUNTER — Encounter: Payer: Self-pay | Admitting: Gastroenterology

## 2022-04-10 ENCOUNTER — Other Ambulatory Visit: Payer: Self-pay | Admitting: Internal Medicine

## 2022-04-10 DIAGNOSIS — Z1231 Encounter for screening mammogram for malignant neoplasm of breast: Secondary | ICD-10-CM

## 2022-04-25 ENCOUNTER — Encounter: Payer: Self-pay | Admitting: Nurse Practitioner

## 2022-04-27 ENCOUNTER — Other Ambulatory Visit: Payer: Self-pay

## 2022-04-27 ENCOUNTER — Emergency Department (HOSPITAL_COMMUNITY)
Admission: EM | Admit: 2022-04-27 | Discharge: 2022-04-27 | Disposition: A | Discharge status: Home / Self Care | Payer: Self-pay | Attending: Emergency Medicine | Admitting: Emergency Medicine

## 2022-04-27 ENCOUNTER — Emergency Department (HOSPITAL_COMMUNITY): Payer: Self-pay

## 2022-04-27 DIAGNOSIS — J4521 Mild intermittent asthma with (acute) exacerbation: Secondary | ICD-10-CM

## 2022-04-27 DIAGNOSIS — J210 Acute bronchiolitis due to respiratory syncytial virus: Secondary | ICD-10-CM | POA: Insufficient documentation

## 2022-04-27 DIAGNOSIS — Z7952 Long term (current) use of systemic steroids: Secondary | ICD-10-CM | POA: Insufficient documentation

## 2022-04-27 DIAGNOSIS — J45909 Unspecified asthma, uncomplicated: Secondary | ICD-10-CM | POA: Insufficient documentation

## 2022-04-27 DIAGNOSIS — Z20822 Contact with and (suspected) exposure to covid-19: Secondary | ICD-10-CM | POA: Insufficient documentation

## 2022-04-27 LAB — RESP PANEL BY RT-PCR (RSV, FLU A&B, COVID)  RVPGX2
Influenza A by PCR: NEGATIVE
Influenza B by PCR: NEGATIVE
Resp Syncytial Virus by PCR: POSITIVE — AB
SARS Coronavirus 2 by RT PCR: NEGATIVE

## 2022-04-27 MED ORDER — PREDNISONE 10 MG PO TABS: 40.0000 mg | ORAL_TABLET | Freq: Every day | ORAL | 0 refills | Status: DC

## 2022-04-27 MED ORDER — PREDNISONE 10 MG PO TABS: 40.0000 mg | ORAL_TABLET | Freq: Every day | ORAL | 0 refills | Status: AC

## 2022-04-27 MED ORDER — PREDNISONE 20 MG PO TABS
60.0000 mg | ORAL_TABLET | Freq: Once | ORAL | Status: AC
  Administered 2022-04-27: 60 mg via ORAL
  Filled 2022-04-27: qty 3

## 2022-04-27 MED ORDER — ALBUTEROL SULFATE HFA 108 (90 BASE) MCG/ACT IN AERS: 2.0000 | INHALATION_SPRAY | RESPIRATORY_TRACT | Status: DC | PRN

## 2022-04-27 NOTE — ED Provider Notes (Signed)
[facility] DEPT Provider Note   CSN: [id number] Arrival date & time: 04/27/22  0053     History  Chief Complaint  Patient presents with   Cough   Wheezing    Wanda Douglas is a 51 y.o. female.  HPI     51 year old female with a history of left breast mass, pituitary adenoma, asthma who presents with concern for cough and wheezing.    She began becoming sick on Wednesday, symptoms worsening on Thursday through the weekend.  She works in an [employer].  Took a home COVID test that was negative.  Has been trying Robitussin. Internship for principal positoin.   Cough worsening, really Saturday.  Hacking croupy type cough.  Was wheezing with it. Out of symbicort, didn't represcribe it because of infrequent attacks.  Robitussin coughing up clear mucus, lots of nasal congestion. Today is dry cough.  Fever when came in but broke.  Thursday had fever and broke.  Feels ok in terms of shortness of breath, moreso coughing and feeling cough and wheezing, raspy breathing/rattle with coughing episodes.  Yesterday was wheezing doing normal things.  Albuterol helping some.  Sometimes has needed prednisone, it is tight with coughing.  No hx of admission to hospital for asthma as an adult. No leg pain or swelling, n/v/d black or bloody stools   Past Medical History:  Diagnosis Date   Asthma    Bloody discharge from left nipple 07/2017   History of COVID-19 04/29/2020   Irregular menstrual cycle    Left breast mass 07/2017   Pituitary adenoma ([place])      Home Medications Prior to Admission medications   Medication Sig Start Date End Date Taking? Authorizing Provider  albuterol (VENTOLIN HFA) 108 (90 Base) MCG/ACT inhaler 1 to 2 puffs (5 minutes apart) every 4 hour as needed for Asthma 03/30/22   Alycia Rossetti, NP  Azelastine HCl 137 MCG/SPRAY SOLN PLACE 2 SPRAYS INTO BOTH NOSTRILS 2 (TWO) TIMES DAILY. USE IN EACH NOSTRIL AS DIRECTED 01/03/19    Garnet Sierras, NP  budesonide-formoterol (SYMBICORT) 80-4.5 MCG/ACT inhaler Inhale 2 puffs into the lungs in the morning and at bedtime. Rinse mouth after use. 05/21/20   Garnet Sierras, NP  calcium carbonate (TUMS EX) 750 MG chewable tablet Chew 2 tablets by mouth daily.    [provider]  cetirizine (ZYRTEC) 10 MG tablet Take 1 tablet (10 mg total) by mouth daily. 03/30/22   Alycia Rossetti, NP  cholecalciferol (VITAMIN D) 1000 UNITS tablet Take 1,000 Units by mouth daily.    [provider]  ciclopirox (PENLAC) 8 % solution Apply topically at bedtime. Apply over nail and surrounding skin. Apply daily over previous coat. After seven (7) days, may remove with alcohol and continue cycle. 03/30/22   Alycia Rossetti, NP  EPINEPHrine 0.3 mg/0.3 mL IJ SOAJ injection Inject 0.3 mg into the muscle as needed for anaphylaxis. 03/30/22   Alycia Rossetti, NP  fluticasone (FLONASE) 50 MCG/ACT nasal spray Place 1 spray into both nostrils daily for 3 days. 09/03/18 02/05/20  Janeece Fitting, PA-C  Multiple Vitamin (MULTIVITAMIN) tablet Take 1 tablet by mouth daily.    [provider]  Phendimetrazine Tartrate 35 MG TABS 1/2-1 tab twice a day as needed for appetite suppression 03/30/22   Alycia Rossetti, NP  predniSONE (DELTASONE) 10 MG tablet Take 4 tablets (40 mg total) by mouth daily for 4 days. 04/27/22 05/01/22  Gareth Morgan, MD  Allergies    Banana, Peanut-containing drug products, Shellfish allergy, and Chlorhexidine    Review of Systems   Review of Systems  Physical Exam Updated Vital Signs BP 125/86 (BP Location: Right Arm)   Pulse 81   Temp 98.2 F (36.8 C) (Oral)   Resp 20   Ht '5\' 4"'$  (1.626 m)   Wt 117.9 kg   LMP 01/13/2022 (Approximate)   SpO2 96%   BMI 44.63 kg/m  Physical Exam Vitals and nursing note reviewed.  Constitutional:      General: She is not in acute distress.    Appearance: She is well-developed. She is not diaphoretic.  HENT:      Head: Normocephalic and atraumatic.  Eyes:     Conjunctiva/sclera: Conjunctivae normal.  Cardiovascular:     Rate and Rhythm: Normal rate and regular rhythm.     Heart sounds: Normal heart sounds. No murmur heard.    No friction rub. No gallop.  Pulmonary:     Effort: Pulmonary effort is normal. No respiratory distress.     Breath sounds: Normal breath sounds. No wheezing or rales.     Comments: Occasional wheezing end expiration Abdominal:     General: There is no distension.     Palpations: Abdomen is soft.     Tenderness: There is no abdominal tenderness. There is no guarding.  Musculoskeletal:        General: No tenderness.     Cervical back: Normal range of motion.  Skin:    General: Skin is warm and dry.     Findings: No erythema or rash.  Neurological:     Mental Status: She is alert and oriented to person, place, and time.     ED Results / Procedures / Treatments   Labs (all labs ordered are listed, but only abnormal results are displayed) Labs Reviewed  RESP PANEL BY RT-PCR (RSV, FLU A&B, COVID)  RVPGX2 - Abnormal; Notable for the following components:      Result Value   Resp Syncytial Virus by PCR POSITIVE (*)    All other components within normal limits    EKG None  Radiology DG Chest 2 View  Result Date: 04/27/2022 CLINICAL DATA:  Cough. EXAM: CHEST - 2 VIEW COMPARISON:  05/21/2020. FINDINGS: The heart size and mediastinal contours are within normal limits. Both lungs are clear. No acute osseous abnormality. IMPRESSION: No active cardiopulmonary disease. Electronically Signed   By: Brett Fairy M.D.   On: 04/27/2022 01:35    Procedures Procedures    Medications Ordered in ED Medications  predniSONE (DELTASONE) tablet 60 mg (60 mg Oral Given 04/27/22 5974)    ED Course/ Medical Decision Making/ A&P                           Medical Decision Making Amount and/or Complexity of Data Reviewed Radiology: ordered.  Risk Prescription drug  management.   51 year old female with a history of left breast mass, pituitary adenoma, asthma who presents with concern for cough and wheezing.    Low clinical suspicion for clinically significant anemia or electrolyte abnormalities.  Chest x-ray was done which showed no evidence of pneumonia, pulmonary edema or pneumothorax.  Has cough with wheezing with end expiration. RSV testing positive, prsentation consistent with RSV bronchitis, asthma exacerbation. Stable for outpatient monitoring, given prednisone '60mg'$  and dose for 4 days.  Reports she has albuterol at home. Patient discharged in stable condition with understanding of reasons to  return.         Final Clinical Impression(s) / ED Diagnoses Final diagnoses:  RSV (acute bronchiolitis due to respiratory syncytial virus)  Mild intermittent asthma with exacerbation    Rx / DC Orders ED Discharge Orders          Ordered    predniSONE (DELTASONE) 10 MG tablet  Daily,   Status:  Discontinued        04/27/22 0907    predniSONE (DELTASONE) 10 MG tablet  Daily        04/27/22 0347              Gareth Morgan, MD 04/27/22 2227

## 2022-04-27 NOTE — ED Notes (Signed)
Patient asked to return to lobby to wait for an available room.  Patient became upset and stated "I don't understand why I am not going back to a room."  Informed patient of triage process and that there was not available room currently.  Patient demanding to know length of wait and patient educated on wait times and patient acuity.

## 2022-04-27 NOTE — ED Notes (Signed)
Lab called to follow up on COVID/FLU/RSV results.  Informed that sample was being ran again due to "Error."

## 2022-04-27 NOTE — ED Triage Notes (Signed)
Patient coming to ED for evaluation of cough and cold symptoms that started on Wednesday.  C/o wheezing and hearing a "rattle when I take a deep breath."  No reports of fever.  Has used albuterol inhalers and taken OTC medications with relief.

## 2022-04-27 NOTE — ED Notes (Signed)
Patient ambulatory to radiology at this time.

## 2022-04-30 ENCOUNTER — Ambulatory Visit (AMBULATORY_SURGERY_CENTER): Payer: BC Managed Care – PPO | Admitting: *Deleted

## 2022-04-30 VITALS — Ht 63.0 in | Wt 260.0 lb

## 2022-04-30 DIAGNOSIS — Z800 Family history of malignant neoplasm of digestive organs: Secondary | ICD-10-CM

## 2022-04-30 DIAGNOSIS — Z1211 Encounter for screening for malignant neoplasm of colon: Secondary | ICD-10-CM

## 2022-04-30 MED ORDER — SUTAB 1479-225-188 MG PO TABS: ORAL_TABLET | ORAL | 0 refills | Status: DC

## 2022-04-30 NOTE — Progress Notes (Signed)
No egg or soy allergy known to patient  No issues known to pt with past sedation with any surgeries or procedures Patient denies ever being told they had issues or difficulty with intubation  No FH of Malignant Hyperthermia Pt is not on diet pills Pt is not on  home 02  Pt is not on blood thinners  Pt denies issues with constipation  No A fib or A flutter Have any cardiac testing pending--NO Pt instructed to use Single[employer] or GoodRx for a price reduction on prep   Pt. Requested sutabs,stated she can not do the liquid,made her aware that some insurance may require prior,she stated she is self pay and she wants sutabs,rx sent per pt.request.   Patient's chart reviewed by Osvaldo Angst CNRA prior to previsit and patient appropriate for the [place].  Previsit completed and red dot placed by patient's name on their procedure day (on provider's schedule).

## 2022-05-02 ENCOUNTER — Emergency Department (HOSPITAL_COMMUNITY)
Admission: EM | Admit: 2022-05-02 | Discharge: 2022-05-03 | Disposition: A | Discharge status: Home / Self Care | Payer: Self-pay | Attending: Emergency Medicine | Admitting: Emergency Medicine

## 2022-05-02 ENCOUNTER — Emergency Department (HOSPITAL_COMMUNITY): Payer: Self-pay

## 2022-05-02 ENCOUNTER — Encounter (HOSPITAL_COMMUNITY): Payer: Self-pay

## 2022-05-02 DIAGNOSIS — Z91010 Allergy to peanuts: Secondary | ICD-10-CM | POA: Insufficient documentation

## 2022-05-02 DIAGNOSIS — R7989 Other specified abnormal findings of blood chemistry: Secondary | ICD-10-CM | POA: Insufficient documentation

## 2022-05-02 DIAGNOSIS — M79605 Pain in left leg: Secondary | ICD-10-CM

## 2022-05-02 LAB — URINALYSIS, ROUTINE W REFLEX MICROSCOPIC
Bilirubin Urine: NEGATIVE
Glucose, UA: NEGATIVE mg/dL
Hgb urine dipstick: NEGATIVE
Ketones, ur: NEGATIVE mg/dL
Nitrite: NEGATIVE
Protein, ur: NEGATIVE mg/dL
Specific Gravity, Urine: 1.021 (ref 1.005–1.030)
pH: 5 (ref 5.0–8.0)

## 2022-05-02 LAB — BASIC METABOLIC PANEL
Anion gap: 9 (ref 5–15)
BUN: 17 mg/dL (ref 6–20)
CO2: 25 mmol/L (ref 22–32)
Calcium: 8.5 mg/dL — ABNORMAL LOW (ref 8.9–10.3)
Chloride: 103 mmol/L (ref 98–111)
Creatinine, Ser: 1.13 mg/dL — ABNORMAL HIGH (ref 0.44–1.00)
GFR, Estimated: 59 mL/min — ABNORMAL LOW (ref >60)
Glucose, Bld: 107 mg/dL — ABNORMAL HIGH (ref 70–99)
Potassium: 3.2 mmol/L — ABNORMAL LOW (ref 3.5–5.1)
Sodium: 137 mmol/L (ref 135–145)

## 2022-05-02 LAB — CBC
HCT: 39.9 % (ref 36.0–46.0)
Hemoglobin: 12.6 g/dL (ref 12.0–15.0)
MCH: 26.2 pg (ref 26.0–34.0)
MCHC: 31.6 g/dL (ref 30.0–36.0)
MCV: 83 fL (ref 80.0–100.0)
Platelets: 207 10*3/uL (ref 150–400)
RBC: 4.81 MIL/uL (ref 3.87–5.11)
RDW: 15 % (ref 11.5–15.5)
WBC: 20.6 10*3/uL — ABNORMAL HIGH (ref 4.0–10.5)
nRBC: 0.1 % (ref 0.0–0.2)

## 2022-05-02 MED ORDER — IBUPROFEN 800 MG PO TABS
800.0000 mg | ORAL_TABLET | Freq: Once | ORAL | Status: AC
  Administered 2022-05-02: 800 mg via ORAL
  Filled 2022-05-02: qty 1

## 2022-05-02 NOTE — ED Provider Notes (Incomplete)
[facility] DEPT Provider Note   CSN: [id number] Arrival date & time: 05/02/22  1753     History {Add pertinent medical, surgical, social history, OB history to HPI:1} Chief Complaint  Patient presents with   Leg Pain    Wanda Douglas is a 51 y.o. female.  Patient presents to the emergency department today for evaluation of left leg pain and swelling.  Patient states that she was diagnosed with RSV 1 week ago.  She went to [place] with family to recover.  She states that she was less active during this time.  She had respiratory symptoms, congestion, fever.  The symptoms were improving.  She returned from [place] and today developed pain in her left proximal calf and left thigh.  She has noted some swelling.  She also has some pain in her left lower back.  Patient denies risk factors for pulmonary embolism including: unilateral leg swelling, history of DVT/PE/other blood clots, use of exogenous hormones, recent immobilizations, recent surgery, recent prolonged travel (>4hr segment), malignancy, hemoptysis.          Home Medications Prior to Admission medications   Medication Sig Start Date End Date Taking? Authorizing Provider  albuterol (VENTOLIN HFA) 108 (90 Base) MCG/ACT inhaler 1 to 2 puffs (5 minutes apart) every 4 hour as needed for Asthma 03/30/22   Alycia Rossetti, NP  Azelastine HCl 137 MCG/SPRAY SOLN PLACE 2 SPRAYS INTO BOTH NOSTRILS 2 (TWO) TIMES DAILY. USE IN EACH NOSTRIL AS DIRECTED 01/03/19   Garnet Sierras, NP  budesonide-formoterol (SYMBICORT) 80-4.5 MCG/ACT inhaler Inhale 2 puffs into the lungs in the morning and at bedtime. Rinse mouth after use. Patient not taking: Reported on 04/30/2022 05/21/20   Garnet Sierras, NP  cetirizine (ZYRTEC) 10 MG tablet Take 1 tablet (10 mg total) by mouth daily. 03/30/22   Alycia Rossetti, NP  cholecalciferol (VITAMIN D) 1000 UNITS tablet Take 1,000 Units by mouth daily.    [provider]  ciclopirox (PENLAC) 8 % solution Apply topically at bedtime. Apply over nail and surrounding skin. Apply daily over previous coat. After seven (7) days, may remove with alcohol and continue cycle. 03/30/22   Alycia Rossetti, NP  EPINEPHrine 0.3 mg/0.3 mL IJ SOAJ injection Inject 0.3 mg into the muscle as needed for anaphylaxis. Patient not taking: Reported on 04/30/2022 03/30/22   Alycia Rossetti, NP  fluticasone [facility]) 50 MCG/ACT nasal spray Place 1 spray into both nostrils daily for 3 days. 09/03/18 02/05/20  Janeece Fitting, PA-C  Multiple Vitamin (MULTIVITAMIN) tablet Take 1 tablet by mouth daily.    [provider]  Sodium Sulfate-Mag Sulfate-KCl (SUTAB) [phone] MG TABS TAKE AS DIRECTED 04/30/22   Mauri Pole, MD      Allergies    Banana, Peanut-containing drug products, Shellfish allergy, and Chlorhexidine    Review of Systems   Review of Systems  Physical Exam Updated Vital Signs BP 138/75   Pulse (!) 101   Temp 98.2 F (36.8 C) (Oral)   Resp 18   LMP 01/13/2022 (Approximate) Comment: Pt states not having sex  SpO2 98%  Physical Exam Vitals and nursing note reviewed.  Constitutional:      General: She is not in acute distress.    Appearance: She is well-developed.  HENT:     Head: Normocephalic and atraumatic.     Right Ear: External ear normal.     Left Ear: External ear normal.     Nose: Nose normal.  Eyes:     Conjunctiva/sclera: Conjunctivae normal.  Cardiovascular:     Rate and Rhythm: Normal rate and regular rhythm.     Heart sounds: No murmur heard. Pulmonary:     Effort: No respiratory distress.     Breath sounds: No wheezing, rhonchi or rales.  Abdominal:     Palpations: Abdomen is soft.     Tenderness: There is no abdominal tenderness. There is no guarding or rebound.  Musculoskeletal:     Cervical back: Normal range of motion and neck supple.     Right lower leg: No edema.     Left lower leg: Edema present.      Comments: Patient with mild tenderness to palpation without evidence of cellulitis to the left proximal calf and thigh area.  Skin:    General: Skin is warm and dry.     Findings: No rash.  Neurological:     General: No focal deficit present.     Mental Status: She is alert. Mental status is at baseline.     Motor: No weakness.  Psychiatric:        Mood and Affect: Mood normal.     ED Results / Procedures / Treatments   Labs (all labs ordered are listed, but only abnormal results are displayed) Labs Reviewed  URINALYSIS, ROUTINE W REFLEX MICROSCOPIC - Abnormal; Notable for the following components:      Result Value   APPearance HAZY (*)    Leukocytes,Ua TRACE (*)    Bacteria, UA MANY (*)    All other components within normal limits  CBC  BASIC METABOLIC PANEL  D-DIMER, QUANTITATIVE    EKG None  Radiology DG Lumbar Spine Complete  Result Date: 05/02/2022 CLINICAL DATA:  Left lower extremity radiating pain. EXAM: LUMBAR SPINE - COMPLETE 4+ VIEW; DG HIP (WITH OR WITHOUT PELVIS) 2-3V LEFT COMPARISON:  No prior lumbar spine. Comparison is made with AP pelvis and frog-leg right hip 03/08/2018. FINDINGS: AP pelvis and separate AP and frog-leg left hip views: Normal bone mineralization. There is no evidence of hip fractures or dislocation. No pelvic fracture or diastasis is seen. There is slight symmetric superior joint space loss of both hips with small acetabular osteophytes. Chronic bone islands are again noted in the proximal left femur. Multiple pelvic phleboliths with otherwise grossly unremarkable soft tissues. Lumbar spine, routine five views: There is normal bone mineralization. There is preservation of the normal lumbar vertebral heights with no evidence of fractures. There is mild anterior wedging of the T12 vertebral body which has a chronic appearance. There is spondylosis of the lower thoracic and the lumbar spine, and mild facet spurring at the lowest 3 lumbar levels. The  foramina appear clear on the oblique projections and the SI joints are patent. There is mild L5-S1 disc space loss. The discs are normal in heights above L5. No nephrolithiasis or other significant soft tissue findings. Mild fecal stasis. IMPRESSION: 1. No evidence of fractures of the AP pelvis, left hip and lumbar spine. 2. Mild degenerative change of both hips. 3. Chronic appearing mild anterior wedging of the T12 vertebral body. 4. 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 5. Mild fecal stasis. Electronically Signed   By: Telford Nab M.D.   On: 05/02/2022 20:16   DG Hip Unilat W or Wo Pelvis 2-3 Views Left  Result Date: 05/02/2022 CLINICAL DATA:  Left lower extremity radiating pain. EXAM: LUMBAR SPINE - COMPLETE 4+ VIEW; DG HIP (WITH  OR WITHOUT PELVIS) 2-3V LEFT COMPARISON:  No prior lumbar spine. Comparison is made with AP pelvis and frog-leg right hip 03/08/2018. FINDINGS: AP pelvis and separate AP and frog-leg left hip views: Normal bone mineralization. There is no evidence of hip fractures or dislocation. No pelvic fracture or diastasis is seen. There is slight symmetric superior joint space loss of both hips with small acetabular osteophytes. Chronic bone islands are again noted in the proximal left femur. Multiple pelvic phleboliths with otherwise grossly unremarkable soft tissues. Lumbar spine, routine five views: There is normal bone mineralization. There is preservation of the normal lumbar vertebral heights with no evidence of fractures. There is mild anterior wedging of the T12 vertebral body which has a chronic appearance. There is spondylosis of the lower thoracic and the lumbar spine, and mild facet spurring at the lowest 3 lumbar levels. The foramina appear clear on the oblique projections and the SI joints are patent. There is mild L5-S1 disc space loss. The discs are normal in heights above L5. No nephrolithiasis or other significant soft tissue  findings. Mild fecal stasis. IMPRESSION: 1. No evidence of fractures of the AP pelvis, left hip and lumbar spine. 2. Mild degenerative change of both hips. 3. Chronic appearing mild anterior wedging of the T12 vertebral body. 4. 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 5. Mild fecal stasis. Electronically Signed   By: Telford Nab M.D.   On: 05/02/2022 20:16   DG Chest 2 View  Result Date: 05/02/2022 CLINICAL DATA:  Fever EXAM: CHEST - 2 VIEW COMPARISON:  04/27/2022 FINDINGS: The heart size and mediastinal contours are within normal limits. Both lungs are clear. The visualized skeletal structures are unremarkable. IMPRESSION: No active cardiopulmonary disease. Electronically Signed   By: Donavan Foil M.D.   On: 05/02/2022 20:09    Procedures Procedures  {Document cardiac monitor, telemetry assessment procedure when appropriate:1}  Medications Ordered in ED Medications  ibuprofen (ADVIL) tablet 800 mg (800 mg Oral Given 05/02/22 2008)    ED Course/ Medical Decision Making/ A&P    Patient seen and examined. History obtained directly from patient. Work-up including labs, imaging, EKG ordered in triage, if performed, were reviewed.    Labs/EKG: Independently reviewed and interpreted.  This included: CBC, BMP pending from triage.  I added on D-dimer.  UA unremarkable.  Imaging: She will need lower extremity DVT study, will set up for tomorrow.  Medications/Fluids: Ordered: Ibuprofen given in triage  Most recent vital signs reviewed and are as follows: BP 138/75   Pulse (!) 101   Temp 98.2 F (36.8 C) (Oral)   Resp 18   LMP 01/13/2022 (Approximate) Comment: Pt states not having sex  SpO2 98%   Initial impression: Left leg pain, no evidence of cellulitis.  Will need DVT rule out.  Also consider muscular pain or radicular pain.  Heart rate 101, but no chest pain or shortness of breath to suggest PE.                               Medical  Decision Making Amount and/or Complexity of Data Reviewed Labs: ordered.   ***  {Document critical care time when appropriate:1} {Document review of labs and clinical decision tools ie heart score, Chads2Vasc2 etc:1}  {Document your independent review of radiology images, and any outside records:1} {Document your discussion with family members, caretakers, and with consultants:1} {Document social determinants of health  affecting pt's care:1} {Document your decision making why or why not admission, treatments were needed:1} Final Clinical Impression(s) / ED Diagnoses Final diagnoses:  None    Rx / DC Orders ED Discharge Orders     None

## 2022-05-02 NOTE — ED Provider Triage Note (Signed)
Emergency Medicine Provider Triage Evaluation Note  Wanda Douglas , a 51 y.o. female  was evaluated in triage.  Pt complains of left leg pain. Recently diagnosed with RSV. Developed pain in the left thigh. Patient had fever, pain in the medial thigh and hip. Pian is 5/6 10 with touch, 7/8 with standing.   Review of Systems  Positive: Fever and leg pain  Negative: sob  Physical Exam  BP 127/86 (BP Location: Right Arm)   Pulse (!) 109   Temp (!) 100.8 F (38.2 C) (Oral)   Resp 18   LMP 01/13/2022 (Approximate)   SpO2 100%  Gen:   Awake, no distress   Resp:  Normal effort  MSK:   Moves extremities without difficulty  Other:    Medical Decision Making  Medically screening exam initiated at 7:03 PM.  Appropriate orders placed.  Wanda Douglas was informed that the remainder of the evaluation will be completed by another provider, this initial triage assessment does not replace that evaluation, and the importance of remaining in the ED until their evaluation is complete.     Margarita Mail, PA-C 05/02/22 [id number]

## 2022-05-02 NOTE — ED Triage Notes (Signed)
Pt presents with c/o left leg pain radiating down her whole leg and up into her left hip and groin area. Pt recently diagnosed with RSV several days ago. Pt also febrile at this time.

## 2022-05-03 ENCOUNTER — Emergency Department (HOSPITAL_BASED_OUTPATIENT_CLINIC_OR_DEPARTMENT_OTHER)
Admission: RE | Admit: 2022-05-03 | Discharge: 2022-05-03 | Disposition: A | Discharge status: Home / Self Care | Payer: Self-pay | Source: Ambulatory Visit

## 2022-05-03 DIAGNOSIS — M7989 Other specified soft tissue disorders: Secondary | ICD-10-CM

## 2022-05-03 DIAGNOSIS — L538 Other specified erythematous conditions: Secondary | ICD-10-CM

## 2022-05-03 DIAGNOSIS — R52 Pain, unspecified: Secondary | ICD-10-CM

## 2022-05-03 LAB — D-DIMER, QUANTITATIVE: D-Dimer, Quant: 0.54 ug/mL-FEU — ABNORMAL HIGH (ref 0.00–0.50)

## 2022-05-03 MED ORDER — POTASSIUM CHLORIDE CRYS ER 20 MEQ PO TBCR
40.0000 meq | EXTENDED_RELEASE_TABLET | Freq: Once | ORAL | Status: AC
  Administered 2022-05-03: 40 meq via ORAL
  Filled 2022-05-03: qty 2

## 2022-05-03 MED ORDER — RIVAROXABAN 15 MG PO TABS
15.0000 mg | ORAL_TABLET | Freq: Once | ORAL | Status: AC
  Administered 2022-05-03: 15 mg via ORAL
  Filled 2022-05-03: qty 1

## 2022-05-03 NOTE — Progress Notes (Signed)
VASCULAR LAB    Left lower extremity venous duplex has been performed.  See CV proc for preliminary results.   Taren Dymek, RVT 05/03/2022, 11:39 AM

## 2022-05-03 NOTE — Discharge Instructions (Signed)
Please read and follow all provided instructions.  Your diagnoses today include:  1. Left leg pain   2. Elevated d-dimer     Tests performed today include: Complete blood cell count: Your infection fighting cell count was elevated, possibly related to recent prednisone use Basic metabolic panel: No concerns D-dimer: Screening test for blood clot was slightly elevated Vital signs. See below for your results today.   Medications prescribed:  None  Take any prescribed medications only as directed.  Home care instructions:  Keep your leg elevated and apply cool compresses if needed.  You may use Tylenol for pain at home.  Follow-up instructions: Please go to [facility], per instructions, later this morning to have the ultrasound performed of your leg.  Return instructions:  Please return to the Emergency Department if you experience worsening symptoms.  Return if you develop chest pain or shortness of breath Return if you develop fever, worsening redness or drainage from the leg Please return if you have any other emergent concerns.  Additional Information:  Your vital signs today were: BP 128/88   Pulse 80   Temp 98.2 F (36.8 C) (Oral)   Resp 18   LMP 01/13/2022 (Approximate) Comment: Pt states not having sex  SpO2 97%  If your blood pressure (BP) was elevated above 135/85 this visit, please have this repeated by your doctor within one month. --------------

## 2022-05-04 ENCOUNTER — Encounter: Payer: Self-pay | Admitting: Internal Medicine

## 2022-05-05 ENCOUNTER — Ambulatory Visit (INDEPENDENT_AMBULATORY_CARE_PROVIDER_SITE_OTHER): Payer: Self-pay | Admitting: Nurse Practitioner

## 2022-05-05 VITALS — BP 114/78 | HR 81 | Temp 97.5°F | Ht 63.0 in | Wt 254.0 lb

## 2022-05-05 DIAGNOSIS — L539 Erythematous condition, unspecified: Secondary | ICD-10-CM

## 2022-05-05 DIAGNOSIS — R600 Localized edema: Secondary | ICD-10-CM

## 2022-05-05 DIAGNOSIS — Z6841 Body Mass Index (BMI) 40.0 and over, adult: Secondary | ICD-10-CM

## 2022-05-05 DIAGNOSIS — L03116 Cellulitis of left lower limb: Secondary | ICD-10-CM

## 2022-05-05 DIAGNOSIS — R238 Other skin changes: Secondary | ICD-10-CM

## 2022-05-05 MED ORDER — SULFAMETHOXAZOLE-TRIMETHOPRIM 800-160 MG PO TABS: 1.0000 | ORAL_TABLET | Freq: Two times a day (BID) | ORAL | 0 refills | Status: DC

## 2022-05-05 NOTE — Progress Notes (Signed)
Hospital follow up  Assessment and Plan: Hospital visit follow up for:   Cellulitis of left lower extremity Erythematous area outlined with permanent marker. Patient to assess for increase in redness, swelling out side of marked area. Also assess for any increase in fever, chills, N/V. Contact office if symptoms worsen or no improvement.  - sulfamethoxazole-trimethoprim (BACTRIM DS) 800-160 MG tablet; Take 1 tablet by mouth 2 (two) times daily.  Dispense: 14 tablet; Refill: 0  Localized erythema Continue to monitor  Localized edema Elevation Continue to monitor  Localized warmth of skin Continue to monitor   Morbid obesity with BMI of 40.0-44.9, adult (HCC) Discussed appropriate BMI Diet modification. Physical activity. Encouraged/praised to build confidence.    All medications were reviewed with patient and family and fully reconciled. All questions answered fully, and patient and family members were encouraged to call the office with any further questions or concerns. Discussed goal to avoid readmission related to this diagnosis.   Over 20 minutes of exam, counseling, chart review, and complex, high/moderate level critical decision making was performed this visit.   Future Appointments  Date Time Provider [place]  05/05/2022  2:00 PM Darrol Jump, NP GAAM-GAAIM None  05/11/2022  2:00 PM Princess Bruins, MD GCG-GCG None  05/21/2022  8:30 AM Mauri Pole, MD LBGI-LEC LBP[facility]ndo  06/04/2022  7:40 AM GI-BCG MM 2 GI-BCGMM GI-BREAST CE  09/30/2022  9:30 AM Alycia Rossetti, NP GAAM-GAAIM None  03/31/2023  9:00 AM Alycia Rossetti, NP GAAM-GAAIM None     HPI 52 y.o.female presents for follow up for transition from recent ER visit. Admit date to the hospital was 05/02/22, patient was discharged from the hospital on 05/03/22 and our clinical staff contacted the office the day after discharge to set up a follow up appointment. The discharge summary, medications,  and diagnostic test results were reviewed before meeting with the patient. The patient was admitted for left leg pain.    Patient presented to the emergency department today for evaluation of left leg pain and swelling.  Patient stated that she was diagnosed with RSV 1 week ago.  She went to [place] with family to recover.  She stated that she was less active during this time.  She had respiratory symptoms, congestion, fever.  The symptoms were improving.  She returned from [place] and started to developed pain in her left proximal calf and left thigh.  She has noted some swelling.  She also has some pain in her left lower back.  Patient denies risk factors for pulmonary embolism including: unilateral leg swelling, history of DVT/PE/other blood clots, use of exogenous hormones, recent immobilizations, recent surgery, recent prolonged travel (>4hr segment), malignancy, hemoptysis.    DVT was ruled out.  She is continuing to experience redness, swelling and pain in the area.  Denies fever, chills, N/V.    Images while in the hospital: LE VENOUS  Result Date: 05/04/2022  Lower Venous DVT Study Patient Name:  JAKERRA FLOYD Hukill  Date of Exam:   05/03/2022 Medical Rec #: [id number]              Accession #:    [id number] Date of Birth: 1970/10/18             Patient Gender: F Patient Age:   65 years Exam Location:  [facility] Procedure:      VAS [place] LOWER EXTREMITY VENOUS (DVT) Referring Phys: Vonna Kotyk GEIPLE --------------------------------------------------------------------------------  Indications: Pain, Swelling, Erythema, and Chills.  Comparison Study:  No prior study on file Performing Technologist: Sharion Dove RVS  Examination Guidelines: A complete evaluation includes B-mode imaging, spectral Doppler, color Doppler, and power Doppler as needed of all accessible portions of each vessel. Bilateral testing is considered an integral part of a complete examination. Limited examinations for  reoccurring indications may be performed as noted. The reflux portion of the exam is performed with the patient in reverse Trendelenburg.  +-----+---------------+---------+-----------+----------+--------------+ RIGHTCompressibilityPhasicitySpontaneityPropertiesThrombus Aging +-----+---------------+---------+-----------+----------+--------------+ CFV  Full           Yes      Yes                                 +-----+---------------+---------+-----------+----------+--------------+   +---------+---------------+---------+-----------+----------+--------------+ LEFT     CompressibilityPhasicitySpontaneityPropertiesThrombus Aging +---------+---------------+---------+-----------+----------+--------------+ CFV      Full           Yes      Yes                                 +---------+---------------+---------+-----------+----------+--------------+ SFJ      Full                                                        +---------+---------------+---------+-----------+----------+--------------+ FV Prox  Full                                                        +---------+---------------+---------+-----------+----------+--------------+ FV Mid   Full                                                        +---------+---------------+---------+-----------+----------+--------------+ FV DistalFull                                                        +---------+---------------+---------+-----------+----------+--------------+ PFV      Full                                                        +---------+---------------+---------+-----------+----------+--------------+ POP      Full           Yes      Yes                                 +---------+---------------+---------+-----------+----------+--------------+ PTV      Full                                                        +---------+---------------+---------+-----------+----------+--------------+  PERO     Full                                                        +---------+---------------+---------+-----------+----------+--------------+     Summary: RIGHT: - There is no evidence of deep vein thrombosis in the lower extremity.  LEFT: - No evidence of common femoral vein obstruction. - No cystic structure found in the popliteal fossa. - Ultrasound characteristics of enlarged lymph nodes noted in the groin.  *See table(s) above for measurements and observations. Electronically signed by Jamelle Haring on 05/04/2022 at 10:16:41 AM.    Final        Current Outpatient Medications (Cardiovascular):    EPINEPHrine 0.3 mg/0.3 mL IJ SOAJ injection, Inject 0.3 mg into the muscle as needed for anaphylaxis. (Patient not taking: Reported on 04/30/2022)  Current Facility-Administered Medications (Cardiovascular):    EPINEPHrine (EPI-PEN) injection 0.3 mg  Current Outpatient Medications (Respiratory):    albuterol (VENTOLIN HFA) 108 (90 Base) MCG/ACT inhaler, 1 to 2 puffs (5 minutes apart) every 4 hour as needed for Asthma   Azelastine HCl 137 MCG/SPRAY SOLN, PLACE 2 SPRAYS INTO BOTH NOSTRILS 2 (TWO) TIMES DAILY. USE IN EACH NOSTRIL AS DIRECTED   cetirizine (ZYRTEC) 10 MG tablet, Take 1 tablet (10 mg total) by mouth daily.   fluticasone (FLONASE) 50 MCG/ACT nasal spray, Place 1 spray into both nostrils daily for 3 days.       Current Outpatient Medications (Other):    cholecalciferol (VITAMIN D) 1000 UNITS tablet, Take 1,000 Units by mouth daily.   ciclopirox (PENLAC) 8 % solution, Apply topically at bedtime. Apply over nail and surrounding skin. Apply daily over previous coat. After seven (7) days, may remove with alcohol and continue cycle.   Multiple Vitamin (MULTIVITAMIN) tablet, Take 1 tablet by mouth daily.   Sodium Sulfate-Mag Sulfate-KCl (SUTAB) [phone] MG TABS, TAKE AS DIRECTED   Past Medical History:  Diagnosis Date   Allergy    SEASONAL   Anemia    Arthritis     ANKLE,LEFT   Asthma    Bloody discharge from left nipple 07/2017   History of COVID-19 04/29/2020   Hyperlipidemia    ELEVATED IN NOVEMBER,2023   Irregular menstrual cycle    Left breast mass 07/2017   Pituitary adenoma (HCC)      Allergies  Allergen Reactions   Banana Hives and Shortness Of Breath   Peanut-Containing Drug Products Hives and Shortness Of Breath    All nuts (allergic) hives    Shellfish Allergy Hives and Shortness Of Breath   Chlorhexidine Hives    ROS: all negative except above.   Physical Exam: There were no vitals filed for this visit. LMP 01/13/2022 (Approximate) Comment: Pt states not having sex General Appearance: Well nourished, in no apparent distress. Eyes: PERRLA, EOMs, conjunctiva no swelling or erythema Sinuses: No Frontal/maxillary tenderness ENT/Mouth: Ext aud canals clear, TMs without erythema, bulging. No erythema, swelling, or exudate on post pharynx.  Tonsils not swollen or erythematous. Hearing normal.  Neck: Supple, thyroid normal.  Respiratory: Respiratory effort normal, BS equal bilaterally without rales, rhonchi, wheezing or stridor.  Cardio: RRR with no MRGs. Brisk peripheral pulses without edema.  Abdomen: Soft, + BS.  Non tender, no guarding, rebound, hernias, masses. Lymphatics: Non tender without lymphadenopathy.  Musculoskeletal: Full ROM, 5/5 strength, normal  gait.  Skin: LLE with approximately 25 cm round irregularly shaped area of erythema located  just proximal to medial mallelous.  Localized edema +1.  Warm and tender to touch.  Surround area WNL, dry without rashes, lesions, ecchymosis.  Neuro: Cranial nerves intact. Normal muscle tone, no cerebellar symptoms. Sensation intact.  Psych: Awake and oriented X 3, normal affect, Insight and Judgment appropriate.     Darrol Jump, NP 1:10 PM [facility]

## 2022-05-05 NOTE — Patient Instructions (Signed)
Cellulitis, Adult  Cellulitis is a skin infection. The infected area is often warm, red, swollen, and sore. It occurs most often in the arms and lower legs. It is very important to get treated for this condition. What are the causes? This condition is caused by bacteria. The bacteria enter through a break in the skin, such as a cut, burn, insect bite, open sore, or crack. What increases the risk? This condition is more likely to occur in people who: Have a weak body defense system (immune system). Have open cuts, burns, bites, or scrapes on the skin. Are older than 52 years of age. Have a blood sugar problem (diabetes). Have a long-lasting (chronic) liver disease (cirrhosis) or kidney disease. Are very overweight (obese). Have a skin problem, such as: Itchy rash (eczema). Slow movement of blood in the veins (venous stasis). Fluid buildup below the skin (edema). Have been treated with high-energy rays (radiation). Use IV drugs. What are the signs or symptoms? Symptoms of this condition include: Skin that is: Red. Streaking. Spotting. Swollen. Sore or painful when you touch it. Warm. A fever. Chills. Blisters. How is this diagnosed? This condition is diagnosed based on: Medical history. Physical exam. Blood tests. Imaging tests. How is this treated? Treatment for this condition may include: Medicines to treat infections or allergies. Home care, such as: Rest. Placing cold or warm cloths (compresses) on the skin. Hospital care, if the condition is very bad. Follow these instructions at home: Medicines Take over-the-counter and prescription medicines only as told by your doctor. If you were prescribed an antibiotic medicine, take it as told by your doctor. Do not stop taking it even if you start to feel better. General instructions  Drink enough fluid to keep your pee (urine) pale yellow. Do not touch or rub the infected area. Raise (elevate) the infected area above  the level of your heart while you are sitting or lying down. Place cold or warm cloths on the area as told by your doctor. Keep all follow-up visits as told by your doctor. This is important. Contact a doctor if: You have a fever. You do not start to get better after 1-2 days of treatment. Your bone or joint under the infected area starts to hurt after the skin has healed. Your infection comes back. This can happen in the same area or another area. You have a swollen bump in the area. You have new symptoms. You feel ill and have muscle aches and pains. Get help right away if: Your symptoms get worse. You feel very sleepy. You throw up (vomit) or have watery poop (diarrhea) for a long time. You see red streaks coming from the area. Your red area gets larger. Your red area turns dark in color. These symptoms may represent a serious problem that is an emergency. Do not wait to see if the symptoms will go away. Get medical help right away. Call your local emergency services (911 in the [place] Do not drive yourself to the hospital. Summary Cellulitis is a skin infection. The area is often warm, red, swollen, and sore. This condition is treated with medicines, rest, and cold and warm cloths. Take all medicines only as told by your doctor. Tell your doctor if symptoms do not start to get better after 1-2 days of treatment. This information is not intended to replace advice given to you by your health care provider. Make sure you discuss any questions you have with your health care provider. Document Revised: 01/29/2021 Document   Reviewed: 01/30/2021 Elsevier Patient Education  2023 [employer]  

## 2022-05-11 ENCOUNTER — Other Ambulatory Visit (HOSPITAL_COMMUNITY)
Admission: RE | Admit: 2022-05-11 | Discharge: 2022-05-11 | Disposition: A | Discharge status: Home / Self Care | Payer: Self-pay | Source: Ambulatory Visit | Attending: Obstetrics & Gynecology | Admitting: Obstetrics & Gynecology

## 2022-05-11 ENCOUNTER — Ambulatory Visit (INDEPENDENT_AMBULATORY_CARE_PROVIDER_SITE_OTHER): Payer: Self-pay | Admitting: Obstetrics & Gynecology

## 2022-05-11 ENCOUNTER — Encounter: Payer: Self-pay | Admitting: Obstetrics & Gynecology

## 2022-05-11 VITALS — BP 120/80 | Ht 63.5 in | Wt 257.0 lb

## 2022-05-11 DIAGNOSIS — N914 Secondary oligomenorrhea: Secondary | ICD-10-CM

## 2022-05-11 DIAGNOSIS — Z01419 Encounter for gynecological examination (general) (routine) without abnormal findings: Secondary | ICD-10-CM | POA: Insufficient documentation

## 2022-05-11 DIAGNOSIS — Z6841 Body Mass Index (BMI) 40.0 and over, adult: Secondary | ICD-10-CM

## 2022-05-11 DIAGNOSIS — D369 Benign neoplasm, unspecified site: Secondary | ICD-10-CM

## 2022-05-11 MED ORDER — MEDROXYPROGESTERONE ACETATE 5 MG PO TABS: 5.0000 mg | ORAL_TABLET | Freq: Every day | ORAL | 4 refills | Status: DC

## 2022-05-11 NOTE — Progress Notes (Signed)
Wanda Douglas 1970/11/16 [id number]   History:    52 y.o. G0 single, virgin.  Environmental consultant.  RP:  New patient presenting for annual gyn exam   HPI: Oligomenorrhea with heavy periods every 3-9 months.  LMP 01/13/2022.  No pelvic pain.  Happy.  Last Pap normal 15 yrs ago.  Breasts normal.  Mammo 2022.  H/O bilateral breast Papillomas in 2019.  Repeat mammo scheduled 05/2022.  Colono scheduled 05/21/22.  Health labs with Fam MD.  BMI 44.81.  Past medical history,surgical history, family history and social history were all reviewed and documented in the EPIC chart.  Gynecologic History Patient's last menstrual period was 01/13/2022 (approximate).  Obstetric History OB History  Gravida Para Term Preterm AB Living  0 0 0 0 0 0  SAB IAB Ectopic Multiple Live Births  0 0 0 0 0  Obstetric Comments  virgin     ROS: A ROS was performed and pertinent positives and negatives are included in the history. GENERAL: No fevers or chills. HEENT: No change in vision, no earache, sore throat or sinus congestion. NECK: No pain or stiffness. CARDIOVASCULAR: No chest pain or pressure. No palpitations. PULMONARY: No shortness of breath, cough or wheeze. GASTROINTESTINAL: No abdominal pain, nausea, vomiting or diarrhea, melena or bright red blood per rectum. GENITOURINARY: No urinary frequency, urgency, hesitancy or dysuria. MUSCULOSKELETAL: No joint or muscle pain, no back pain, no recent trauma. DERMATOLOGIC: No rash, no itching, no lesions. ENDOCRINE: No polyuria, polydipsia, no heat or cold intolerance. No recent change in weight. HEMATOLOGICAL: No anemia or easy bruising or bleeding. NEUROLOGIC: No headache, seizures, numbness, tingling or weakness. PSYCHIATRIC: No depression, no loss of interest in normal activity or change in sleep pattern.     Exam:   BP 120/80 (BP Location: Right Arm, Patient Position: Sitting, Cuff Size: Large)   Ht 5' 3.5" (1.613 m)   Wt 257 lb (116.6 kg)   LMP  01/13/2022 (Approximate) Comment: Pt states not having sex pt states she is a virgin  BMI 44.81 kg/m   Body mass index is 44.81 kg/m.  General appearance : Well developed well nourished female. No acute distress HEENT: Eyes: no retinal hemorrhage or exudates,  Neck supple, trachea midline, no carotid bruits, no thyroidmegaly Lungs: Clear to auscultation, no rhonchi or wheezes, or rib retractions  Heart: Regular rate and rhythm, no murmurs or gallops Breast:Examined in sitting and supine position were symmetrical in appearance, no palpable masses or tenderness,  no skin retraction, no nipple inversion, no nipple discharge, no skin discoloration, no axillary or supraclavicular lymphadenopathy Abdomen: no palpable masses or tenderness, no rebound or guarding Extremities: no edema or skin discoloration or tenderness  Pelvic: Vulva: Normal             Vagina: No gross lesions or discharge  Cervix: No gross lesions or discharge. Virgin speculum used. Pap reflex done.  Uterus  WV, normal size, shape and consistency, non-tender and mobile  Adnexa  Without masses or tenderness  Anus: Normal   Assessment/Plan:  52 y.o. female for annual exam   1. Encounter for routine gynecological examination with Papanicolaou smear of cervix Oligomenorrhea with heavy periods every 3-9 months.  LMP 01/13/2022.  No pelvic pain.  [place].  Last Pap normal 15 yrs ago.  Breasts normal.  Mammo 2022.  H/O bilateral breast Papillomas in 2019.  Repeat mammo scheduled 05/2022.  Colono scheduled 05/21/22.  Health labs with Fam MD.  BMI 44.81. - Cytology - PAP( [facility])  2. Secondary oligomenorrhea Possibly perimenopausal.  Will give cyclic Provera to control the cycle and prevent hyperplasia.  3. Intraductal papilloma Mammo scheduled for 05/2022.  4. Morbid obesity with BMI of 40.0-44.9, adult ([place]) Lower calorie/carb diet.  Increase fitness activities.  Other orders - medroxyPROGESTERone (PROVERA) 5 MG tablet;  Take 1 tablet (5 mg total) by mouth daily for 7 days. 1 tab PO daily x 7 days every 3 months if no natural menstrual period.   Princess Bruins MD, 2:53 PM

## 2022-05-12 LAB — CYTOLOGY - PAP
Adequacy: ABSENT
Diagnosis: NEGATIVE

## 2022-05-14 ENCOUNTER — Ambulatory Visit
Admission: EM | Admit: 2022-05-14 | Discharge: 2022-05-14 | Disposition: A | Discharge status: Home / Self Care | Payer: Self-pay | Attending: Urgent Care | Admitting: Urgent Care

## 2022-05-14 DIAGNOSIS — L270 Generalized skin eruption due to drugs and medicaments taken internally: Secondary | ICD-10-CM

## 2022-05-14 DIAGNOSIS — J4530 Mild persistent asthma, uncomplicated: Secondary | ICD-10-CM

## 2022-05-14 DIAGNOSIS — Z872 Personal history of diseases of the skin and subcutaneous tissue: Secondary | ICD-10-CM

## 2022-05-14 MED ORDER — PREDNISONE 20 MG PO TABS: ORAL_TABLET | ORAL | 0 refills | Status: DC

## 2022-05-14 MED ORDER — HYDROXYZINE HCL 25 MG PO TABS: 12.5000 mg | ORAL_TABLET | Freq: Three times a day (TID) | ORAL | 0 refills | Status: DC | PRN

## 2022-05-14 NOTE — ED Triage Notes (Signed)
Pt c/o rash started yesterday-states she completed bactrim 2 days ago for cellulitis to LLE-NAD-steady gait

## 2022-05-14 NOTE — ED Provider Notes (Signed)
[facility]  Note:  This document was prepared using Systems analyst and may include unintentional dictation errors.  MRN: [id number] DOB: Oct 14, 1970  Subjective:   Wanda Douglas is a 52 y.o. female presenting for 2-day history of acute onset diffuse rash over her legs, torso, neck and arms.  Thinks it is related to her having used Bactrim.  She just finished this 2 days ago.  Was taking this for cellulitis of the left lower extremity.  She was also undergoing treatment with supportive care for RSV.  Has felt mild shortness of breath and chest tightness.  This is responding to her use of the albuterol inhaler.  Does not need a refill.  No facial or oral swelling.   Current Facility-Administered Medications:    EPINEPHrine (EPI-PEN) injection 0.3 mg, 0.3 mg, Intramuscular, Once, Alycia Rossetti, NP  Current Outpatient Medications:    albuterol (VENTOLIN HFA) 108 (90 Base) MCG/ACT inhaler, 1 to 2 puffs (5 minutes apart) every 4 hour as needed for Asthma, Disp: 18 g, Rfl: 1   Azelastine HCl 137 MCG/SPRAY SOLN, PLACE 2 SPRAYS INTO BOTH NOSTRILS 2 (TWO) TIMES DAILY. USE IN EACH NOSTRIL AS DIRECTED, Disp: 30 mL, Rfl: 1   cetirizine (ZYRTEC) 10 MG tablet, Take 1 tablet (10 mg total) by mouth daily., Disp: 90 tablet, Rfl: 1   cholecalciferol (VITAMIN D) 1000 UNITS tablet, Take 1,000 Units by mouth daily., Disp: , Rfl:    ciclopirox (PENLAC) 8 % solution, Apply topically at bedtime. Apply over nail and surrounding skin. Apply daily over previous coat. After seven (7) days, may remove with alcohol and continue cycle., Disp: 6.6 mL, Rfl: 0   EPINEPHrine 0.3 mg/0.3 mL IJ SOAJ injection, Inject 0.3 mg into the muscle as needed for anaphylaxis., Disp: 1 each, Rfl: 1   fluticasone (FLONASE) 50 MCG/ACT nasal spray, Place 1 spray into both nostrils daily for 3 days., Disp: 1 g, Rfl: 0   medroxyPROGESTERone (PROVERA) 5 MG tablet, Take 1 tablet (5 mg total)  by mouth daily for 7 days. 1 tab PO daily x 7 days every 3 months if no natural menstrual period., Disp: 7 tablet, Rfl: 4   Multiple Vitamin (MULTIVITAMIN) tablet, Take 1 tablet by mouth daily., Disp: , Rfl:    Sodium Sulfate-Mag Sulfate-KCl (SUTAB) [phone] MG TABS, TAKE AS DIRECTED, Disp: 24 tablet, Rfl: 0   sulfamethoxazole-trimethoprim (BACTRIM DS) 800-160 MG tablet, Take 1 tablet by mouth 2 (two) times daily., Disp: 14 tablet, Rfl: 0   Allergies  Allergen Reactions   Banana Hives and Shortness Of Breath   Peanut-Containing Drug Products Hives and Shortness Of Breath    All nuts (allergic) hives    Shellfish Allergy Hives and Shortness Of Breath   Chlorhexidine Hives    Past Medical History:  Diagnosis Date   Allergy    SEASONAL   Anemia    Arthritis    ANKLE,LEFT   Asthma    Bloody discharge from left nipple 07/2017   History of COVID-19 04/29/2020   Hyperlipidemia    ELEVATED IN NOVEMBER,2023   Irregular menstrual cycle    Left breast mass 07/2017   Pituitary adenoma [facility])      Past Surgical History:  Procedure Laterality Date   BREAST DUCTAL SYSTEM EXCISION Left 07/26/2017   Procedure: EXCISION DUCTAL SYSTEM BREAST;  Surgeon: Rolm Bookbinder, MD;  Location: [place];  Service: General;  Laterality: Left;   BREAST DUCTAL SYSTEM EXCISION Bilateral 05/23/2020  Procedure: BIL BREAST CENTRAL DUCT EXCISION;  Surgeon: Rolm Bookbinder, MD;  Location: [place];  Service: General;  Laterality: Bilateral;   RADIOACTIVE SEED GUIDED EXCISIONAL BREAST BIOPSY Left 07/26/2017   Procedure: LEFT RADIOACTIVE SEED GUIDED EXCISIONAL BREAST BIOPSY ERAS PATHWAY;  Surgeon: Rolm Bookbinder, MD;  Location: [place];  Service: General;  Laterality: Left;   RADIOACTIVE SEED GUIDED EXCISIONAL BREAST BIOPSY Right 06/16/2018   Procedure: RADIOACTIVE SEED GUIDED EXCISIONAL RIGHT BREAST BIOPSY;  Surgeon: Rolm Bookbinder, MD;  Location:  [place];  Service: General;  Laterality: Right;   right breast biopsy Right 2019   With axillary lymph node biopsy   TONSILLECTOMY      Family History  Problem Relation Age of Onset   Hypertension Mother    Spina bifida Mother    Drug abuse Mother    Bladder Cancer Mother        smoker   Myopathy Father        Inclusion myolitis   Colon cancer Maternal Aunt        RARE FORM ASSOSIATED WITH HIV   Lung cancer Maternal Grandmother        smoker   Rheum arthritis Maternal Grandmother    Colon cancer Maternal Grandfather    Hypertension Maternal Grandfather    Diabetes Maternal Grandfather    Breast cancer Paternal Grandmother    Diabetes Paternal Grandmother    Bladder Cancer Paternal Grandmother    Colon polyps Neg Hx    Crohn's disease Neg Hx    Esophageal cancer Neg Hx    Rectal cancer Neg Hx    Stomach cancer Neg Hx    Ulcerative colitis Neg Hx     Social History   Tobacco Use   Smoking status: Never    Passive exposure: Current (FAMILY MEMBERS SMOKE)   Smokeless tobacco: Never  Vaping Use   Vaping Use: Never used  Substance Use Topics   Alcohol use: Yes    Comment: OCCASSIONALLY   Drug use: No    ROS   Objective:   Vitals: BP 126/78 (BP Location: Right Arm)   Pulse 85   Temp 98 F (36.7 C) (Oral)   Resp 15   LMP 01/13/2022 (Approximate) Comment: Pt states not having sex pt states she is a virgin  SpO2 94%   Physical Exam Constitutional:      General: She is not in acute distress.    Appearance: Normal appearance. She is well-developed. She is not ill-appearing, toxic-appearing or diaphoretic.  HENT:     Head: Normocephalic and atraumatic.     Nose: Nose normal.     Mouth/Throat:     Mouth: Mucous membranes are moist.     Pharynx: No pharyngeal swelling, oropharyngeal exudate, posterior oropharyngeal erythema or uvula swelling.     Tonsils: No tonsillar exudate or tonsillar abscesses. 0 on the right. 0 on the left.      Comments: No signs of airway compromise.  Throat is patent, patient speaking in full sentences and controlling secretions. Eyes:     General: No scleral icterus.       Right eye: No discharge.        Left eye: No discharge.     Extraocular Movements: Extraocular movements intact.  Cardiovascular:     Rate and Rhythm: Normal rate and regular rhythm.     Heart sounds: Normal heart sounds. No murmur heard.    No friction rub. No gallop.  Pulmonary:  Effort: Pulmonary effort is normal. No respiratory distress.     Breath sounds: No stridor. No wheezing, rhonchi or rales.  Chest:     Chest wall: No tenderness.  Skin:    General: Skin is warm and dry.     Findings: Rash (maculopapular patches scattered over her extremities, torso sparing the face) present.  Neurological:     General: No focal deficit present.     Mental Status: She is alert and oriented to person, place, and time.  Psychiatric:        Mood and Affect: Mood normal.        Behavior: Behavior normal.     Recent Results (from the past 2160 hour(s))  CBC with Differential/Platelet     Status: Abnormal   Collection Time: 03/30/22 12:00 AM  Result Value Ref Range   WBC 7.2 3.8 - 10.8 Thousand/uL   RBC 4.87 3.80 - 5.10 Million/uL   Hemoglobin 12.8 11.7 - 15.5 g/dL   HCT 40.2 35.0 - 45.0 %   MCV 82.5 80.0 - 100.0 fL   MCH 26.3 (L) 27.0 - 33.0 pg   MCHC 31.8 (L) 32.0 - 36.0 g/dL   RDW 13.4 11.0 - 15.0 %   Platelets 223 140 - 400 Thousand/uL   MPV 11.3 7.5 - 12.5 fL   Neutro Abs 3,247 1,500 - 7,800 cells/uL   Lymphs Abs 2,974 850 - 3,900 cells/uL   Absolute Monocytes 734 200 - 950 cells/uL   Eosinophils Absolute 223 15 - 500 cells/uL   Basophils Absolute 22 0 - 200 cells/uL   Neutrophils Relative % 45.1 %   Total Lymphocyte 41.3 %   Monocytes Relative 10.2 %   Eosinophils Relative 3.1 %   Basophils Relative 0.3 %  COMPLETE METABOLIC PANEL WITH GFR     Status: None   Collection Time: 03/30/22 12:00 AM  Result  Value Ref Range   Glucose, Bld 89 65 - 99 mg/dL    Comment: .            Fasting reference interval .    BUN 17 7 - 25 mg/dL   Creat 0.91 0.50 - 1.03 mg/dL   eGFR 76 > OR = 60 mL/min/1.26m   BUN/Creatinine Ratio SEE NOTE: 6 - 22 (calc)    Comment:    Not Reported: BUN and Creatinine are within    reference range. .    Sodium 139 135 - 146 mmol/L   Potassium 4.1 3.5 - 5.3 mmol/L   Chloride 105 98 - 110 mmol/L   CO2 27 20 - 32 mmol/L   Calcium 9.2 8.6 - 10.4 mg/dL   Total Protein 7.1 6.1 - 8.1 g/dL   Albumin 3.8 3.6 - 5.1 g/dL   Globulin 3.3 1.9 - 3.7 g/dL (calc)   AG Ratio 1.2 1.0 - 2.5 (calc)   Total Bilirubin 0.4 0.2 - 1.2 mg/dL   Alkaline phosphatase (APISO) 102 37 - 153 U/L   AST 14 10 - 35 U/L   ALT 14 6 - 29 U/L  Lipid panel     Status: Abnormal   Collection Time: 03/30/22 12:00 AM  Result Value Ref Range   Cholesterol 203 (H) <200 mg/dL   HDL 60 > OR = 50 mg/dL   Triglycerides 70 <150 mg/dL   LDL Cholesterol (Calc) 127 (H) mg/dL (calc)    Comment: Reference range: <100 . Desirable range <100 mg/dL for primary prevention;   <70 mg/dL for patients with CHD or diabetic patients  with > or = 2 CHD risk factors. Marland Kitchen LDL-C is now calculated using the Martin-Hopkins  calculation, which is a validated novel method providing  better accuracy than the Friedewald equation in the  estimation of LDL-C.  Cresenciano Genre et al. Annamaria Helling. 4332;951(88): 2061-2068  (http://education.[facility].com/faq/FAQ164)    Total CHOL/HDL Ratio 3.4 <5.0 (calc)   Non-HDL Cholesterol (Calc) 143 (H) <130 mg/dL (calc)    Comment: For patients with diabetes plus 1 major ASCVD risk  factor, treating to a non-HDL-C goal of <100 mg/dL  (LDL-C of <70 mg/dL) is considered a therapeutic  option.   TSH     Status: None   Collection Time: 03/30/22 12:00 AM  Result Value Ref Range   TSH 0.77 mIU/L    Comment:           Reference Range .           > or = 20 Years  0.40-4.50 .                 Pregnancy Ranges           First trimester    0.26-2.66           Second trimester   0.55-2.73           Third trimester    0.43-2.91   Hemoglobin A1c     Status: Abnormal   Collection Time: 03/30/22 12:00 AM  Result Value Ref Range   Hgb A1c MFr Bld 5.8 (H) <5.7 % of total Hgb    Comment: For someone without known diabetes, a hemoglobin  A1c value between 5.7% and 6.4% is consistent with prediabetes and should be confirmed with a  follow-up test. . For someone with known diabetes, a value <7% indicates that their diabetes is well controlled. A1c targets should be individualized based on duration of diabetes, age, comorbid conditions, and other considerations. . This assay result is consistent with an increased risk of diabetes. . Currently, no consensus exists regarding use of hemoglobin A1c for diagnosis of diabetes for children. .    Mean Plasma Glucose 120 mg/dL   eAG (mmol/L) 6.6 mmol/L  Urinalysis w microscopic + reflex cultur     Status: Abnormal   Collection Time: 03/30/22 12:00 AM   Specimen: Urine  Result Value Ref Range   Color, Urine YELLOW YELLOW   APPearance CLEAR CLEAR   Specific Gravity, Urine 1.014 1.001 - 1.035   pH 6.0 5.0 - 8.0   Glucose, UA NEGATIVE NEGATIVE   Bilirubin Urine NEGATIVE NEGATIVE   Ketones, ur NEGATIVE NEGATIVE   Hgb urine dipstick NEGATIVE NEGATIVE   Protein, ur NEGATIVE NEGATIVE   Nitrites, Initial NEGATIVE NEGATIVE   Leukocyte Esterase TRACE (A) NEGATIVE   WBC, UA NONE SEEN 0 - 5 /HPF   RBC / HPF NONE SEEN 0 - 2 /HPF   Squamous Epithelial / HPF 0-5 < OR = 5 /HPF   Bacteria, UA NONE SEEN NONE SEEN /HPF   Hyaline Cast NONE SEEN NONE SEEN /LPF   Note      Comment: This urine was analyzed for the presence of WBC,  RBC, bacteria, casts, and other formed elements.  Only those elements seen were reported. Marland Kitchen Leisa Lenz / creatinine urine ratio     Status: None   Collection Time: 03/30/22 12:00 AM  Result Value Ref Range    Creatinine, Urine 93 20 - 275 mg/dL   Microalb, Ur <0.2 mg/dL    Comment: Reference  Range Not established    Microalb Creat Ratio NOTE <30 mcg/mg creat    Comment: NOTE: The urine albumin value is less than  0.2 mg/dL therefore we are unable to calculate  excretion and/or creatinine ratio. . The ADA defines abnormalities in albumin excretion as follows: Marland Kitchen Albuminuria Category        Result (mcg/mg creatinine) . Normal to Mildly increased   <30 Moderately increased         30-299  Severely increased           > OR = 300 . The ADA recommends that at least two of three specimens collected within a 3-6 month period be abnormal before considering a patient to be within a diagnostic category.   Prolactin     Status: Abnormal   Collection Time: 03/30/22 12:00 AM  Result Value Ref Range   Prolactin 49.8 (H) ng/mL    Comment:             Reference Range  Females         Non-pregnant        3.0-30.0         Pregnant           10.0-209.0         Postmenopausal      2.0-20.0 . . .   Urine Culture     Status: None   Collection Time: 03/30/22 12:00 AM  Result Value Ref Range   MICRO NUMBER: [id number]    SPECIMEN QUALITY: Adequate    Sample Source URINE    STATUS: FINAL    Result:      Less than 10,000 CFU/mL of single Gram positive organism isolated. No further testing will be performed. If clinically indicated, recollection using a method to minimize contamination, with prompt transfer to Urine Culture Transport Tube, is recommended.  REFLEXIVE URINE CULTURE     Status: None   Collection Time: 03/30/22 12:00 AM  Result Value Ref Range   REFLEXIVE URINE CULTURE      Comment: CULTURE INDICATED - RESULTS TO FOLLOW  Resp panel by RT-PCR (RSV, Flu A&B, Covid) Anterior Nasal Swab     Status: Abnormal   Collection Time: 04/27/22  8:09 AM   Specimen: Anterior Nasal Swab  Result Value Ref Range   SARS Coronavirus 2 by RT PCR NEGATIVE NEGATIVE    Comment: (NOTE) SARS-CoV-2 target  nucleic acids are NOT DETECTED.  The SARS-CoV-2 RNA is generally detectable in upper respiratory specimens during the acute phase of infection. The lowest concentration of SARS-CoV-2 viral copies this assay can detect is 138 copies/mL. A negative result does not preclude SARS-Cov-2 infection and should not be used as the sole basis for treatment or other patient management decisions. A negative result may occur with  improper specimen collection/handling, submission of specimen other than nasopharyngeal swab, presence of viral mutation(s) within the areas targeted by this assay, and inadequate number of viral copies(<138 copies/mL). A negative result must be combined with clinical observations, patient history, and epidemiological information. The expected result is Negative.  Fact Sheet for Patients:  EntrepreneurPulse.com.au  Fact Sheet for Healthcare Providers:  IncredibleEmployment.be  This test is no t yet approved or cleared by the [place] FDA and  has been authorized for detection and/or diagnosis of SARS-CoV-2 by FDA under an Emergency Use Authorization (EUA). This EUA will remain  in effect (meaning this test can be used) for the duration of the COVID-19 declaration under Section 564(b)(1) of the  Act, 21 U.S.C.section 360bbb-3(b)(1), unless the authorization is terminated  or revoked sooner.       Influenza A by PCR NEGATIVE NEGATIVE   Influenza B by PCR NEGATIVE NEGATIVE    Comment: (NOTE) The Xpert Xpress SARS-CoV-2/FLU/RSV plus assay is intended as an aid in the diagnosis of influenza from Nasopharyngeal swab specimens and should not be used as a sole basis for treatment. Nasal washings and aspirates are unacceptable for Xpert Xpress SARS-CoV-2/FLU/RSV testing.  Fact Sheet for Patients: EntrepreneurPulse.com.au  Fact Sheet for Healthcare Providers: IncredibleEmployment.be  This test  is not yet approved or cleared by the [place] FDA and has been authorized for detection and/or diagnosis of SARS-CoV-2 by FDA under an Emergency Use Authorization (EUA). This EUA will remain in effect (meaning this test can be used) for the duration of the COVID-19 declaration under Section 564(b)(1) of the Act, 21 U.S.C. section 360bbb-3(b)(1), unless the authorization is terminated or revoked.     Resp Syncytial Virus by PCR POSITIVE (A) NEGATIVE    Comment: (NOTE) Fact Sheet for Patients: EntrepreneurPulse.com.au  Fact Sheet for Healthcare Providers: IncredibleEmployment.be  This test is not yet approved or cleared by the [place] FDA and has been authorized for detection and/or diagnosis of SARS-CoV-2 by FDA under an Emergency Use Authorization (EUA). This EUA will remain in effect (meaning this test can be used) for the duration of the COVID-19 declaration under Section 564(b)(1) of the Act, 21 U.S.C. section 360bbb-3(b)(1), unless the authorization is terminated or revoked.  Performed at [facility], [place] [address] [place], [place] [address]   Urinalysis, Routine w reflex microscopic Urine, Clean Catch     Status: Abnormal   Collection Time: 05/02/22 10:18 PM  Result Value Ref Range   Color, Urine YELLOW YELLOW   APPearance HAZY (A) CLEAR   Specific Gravity, Urine 1.021 1.005 - 1.030   pH 5.0 5.0 - 8.0   Glucose, UA NEGATIVE NEGATIVE mg/dL   Hgb urine dipstick NEGATIVE NEGATIVE   Bilirubin Urine NEGATIVE NEGATIVE   Ketones, ur NEGATIVE NEGATIVE mg/dL   Protein, ur NEGATIVE NEGATIVE mg/dL   Nitrite NEGATIVE NEGATIVE   Leukocytes,Ua TRACE (A) NEGATIVE   RBC / HPF 0-5 0 - 5 RBC/hpf   WBC, UA 6-10 0 - 5 WBC/hpf   Bacteria, UA MANY (A) NONE SEEN   Squamous Epithelial / HPF 0-5 0 - 5 /HPF   Mucus PRESENT     Comment: Performed at [facility], [place] [address], [place], [place] [address]   CBC     Status: Abnormal   Collection Time: 05/02/22 10:56 PM  Result Value Ref Range   WBC 20.6 (H) 4.0 - 10.5 K/uL   RBC 4.81 3.87 - 5.11 MIL/uL   Hemoglobin 12.6 12.0 - 15.0 g/dL   HCT 39.9 36.0 - 46.0 %   MCV 83.0 80.0 - 100.0 fL   MCH 26.2 26.0 - 34.0 pg   MCHC 31.6 30.0 - 36.0 g/dL   RDW 15.0 11.5 - 15.5 %   Platelets 207 150 - 400 K/uL   nRBC 0.1 0.0 - 0.2 %    Comment: Performed at [facility], [place] [address], [place], [place] [address]  Basic metabolic panel     Status: Abnormal   Collection Time: 05/02/22 10:56 PM  Result Value Ref Range   Sodium 137 135 - 145 mmol/L   Potassium 3.2 (L) 3.5 - 5.1 mmol/L   Chloride 103 98 - 111 mmol/L  CO2 25 22 - 32 mmol/L   Glucose, Bld 107 (H) 70 - 99 mg/dL    Comment: Glucose reference range applies only to samples taken after fasting for at least 8 hours.   BUN 17 6 - 20 mg/dL   Creatinine, Ser 1.13 (H) 0.44 - 1.00 mg/dL   Calcium 8.5 (L) 8.9 - 10.3 mg/dL   GFR, Estimated 59 (L) >60 mL/min    Comment: (NOTE) Calculated using the CKD-EPI Creatinine Equation (2021)    Anion gap 9 5 - 15    Comment: Performed at [facility], [place] [address] [place], [place] [address]  D-dimer, quantitative     Status: Abnormal   Collection Time: 05/02/22 11:43 PM  Result Value Ref Range   D-Dimer, Quant 0.54 (H) 0.00 - 0.50 ug/mL-FEU    Comment: (NOTE) At the manufacturer cut-off value of 0.5 g/mL FEU, this assay has a negative predictive value of 95-100%.This assay is intended for use in conjunction with a clinical pretest probability (PTP) assessment model to exclude pulmonary embolism (PE) and deep venous thrombosis (DVT) in outpatients suspected of PE or DVT. Results should be correlated with clinical presentation. Performed at [facility], [place] [address], [place], [place] [address]   Cytology - PAP( [place])     Status: None   Collection Time: 05/11/22  2:59 PM  Result  Value Ref Range   Adequacy      Satisfactory for evaluation; transformation zone component ABSENT.   Diagnosis      - Negative for intraepithelial lesion or malignancy (NILM)     Assessment and Plan :   PDMP not reviewed this encounter.  1. Drug rash   2. Mild persistent asthma without complication   3. History of cellulitis     No signs of anaphylaxis.  Will hold off on further antibiotics for cellulitis of the left lower extremity as she is improved in this regard.  Suspect drug rash from the use of Bactrim.  Will have her use an oral prednisone course with hydroxyzine, can also help with her respiratory symptoms that she is recovering from RSV.  Will recheck her CBC.  Counseled patient on potential for adverse effects with medications prescribed/recommended today, ER and return-to-clinic precautions discussed, patient verbalized understanding.    Jaynee Eagles, [place] 05/14/22 1413

## 2022-05-15 LAB — CBC WITH DIFFERENTIAL/PLATELET
Basophils Absolute: 0 10*3/uL (ref 0.0–0.2)
Basos: 0 %
EOS (ABSOLUTE): 0.5 10*3/uL — ABNORMAL HIGH (ref 0.0–0.4)
Eos: 5 %
Hematocrit: 39.2 % (ref 34.0–46.6)
Hemoglobin: 12.8 g/dL (ref 11.1–15.9)
Immature Grans (Abs): 0 10*3/uL (ref 0.0–0.1)
Immature Granulocytes: 0 %
Lymphocytes Absolute: 2.9 10*3/uL (ref 0.7–3.1)
Lymphs: 32 %
MCH: 26.6 pg (ref 26.6–33.0)
MCHC: 32.7 g/dL (ref 31.5–35.7)
MCV: 81 fL (ref 79–97)
Monocytes Absolute: 0.7 10*3/uL (ref 0.1–0.9)
Monocytes: 8 %
Neutrophils Absolute: 4.8 10*3/uL (ref 1.4–7.0)
Neutrophils: 55 %
Platelets: 288 10*3/uL (ref 150–450)
RBC: 4.82 x10E6/uL (ref 3.77–5.28)
RDW: 14.3 % (ref 11.7–15.4)
WBC: 9 10*3/uL (ref 3.4–10.8)

## 2022-05-19 NOTE — Telephone Encounter (Signed)
Reviewed with Dr. Dellis Filbert and [facility], [place]. Call returned to patient to address concerns.   Encounter closed.

## 2022-05-20 ENCOUNTER — Telehealth: Payer: Self-pay | Admitting: Gastroenterology

## 2022-05-20 NOTE — Telephone Encounter (Signed)
Agree, thanks

## 2022-05-20 NOTE — Telephone Encounter (Signed)
Inbound call, patient has a procedure tomorrow. However, she was going over her instructions today and noticed it said no beans, or raw vegetables. Patient is calling stating she has been eating beans and vegetables every day since Monday. Patient is wondering if she should reschedule or if she can come in for procedure.

## 2022-05-20 NOTE — Telephone Encounter (Signed)
Returned pts call.  She states that she has been eating some of the foods on the list that she was supposed to avoid.  She has a bean soup the past several days.  Advised her that she is ok to proceed with procedure tomorrow.

## 2022-05-21 ENCOUNTER — Encounter: Payer: Self-pay | Admitting: Gastroenterology

## 2022-05-21 ENCOUNTER — Ambulatory Visit (AMBULATORY_SURGERY_CENTER): Payer: Self-pay | Admitting: Gastroenterology

## 2022-05-21 VITALS — BP 112/68 | HR 66 | Temp 96.9°F | Resp 15 | Ht 63.0 in | Wt 260.0 lb

## 2022-05-21 DIAGNOSIS — D127 Benign neoplasm of rectosigmoid junction: Secondary | ICD-10-CM

## 2022-05-21 DIAGNOSIS — D122 Benign neoplasm of ascending colon: Secondary | ICD-10-CM

## 2022-05-21 DIAGNOSIS — D123 Benign neoplasm of transverse colon: Secondary | ICD-10-CM

## 2022-05-21 DIAGNOSIS — Z1211 Encounter for screening for malignant neoplasm of colon: Secondary | ICD-10-CM

## 2022-05-21 NOTE — Patient Instructions (Addendum)
Recommendation: Patient has a contact number available for                            emergencies. The signs and symptoms of potential                            delayed complications were discussed with the                            patient. Return to normal activities tomorrow.                            Written discharge instructions were provided to the                            patient.                           - Resume previous diet.                           - Continue present medications.                           - Await pathology results.                           - Repeat colonoscopy in 3 - 5 years for                            surveillance based on pathology results.  Handouts on polyps and hemorrhoids given.  YOU HAD AN ENDOSCOPIC PROCEDURE TODAY AT [facility]:   Refer to the procedure report that was given to you for any specific questions about what was found during the examination.  If the procedure report does not answer your questions, please call your gastroenterologist to clarify.  If you requested that your care partner not be given the details of your procedure findings, then the procedure report has been included in a sealed envelope for you to review at your convenience later.  YOU SHOULD EXPECT: Some feelings of bloating in the abdomen. Passage of more gas than usual.  Walking can help get rid of the air that was put into your GI tract during the procedure and reduce the bloating. If you had a lower endoscopy (such as a colonoscopy or flexible sigmoidoscopy) you may notice spotting of blood in your stool or on the toilet paper. If you underwent a bowel prep for your procedure, you may not have a normal bowel movement for a few days.  Please Note:  You might notice some irritation and congestion in your nose or some drainage.  This is from the oxygen used during your procedure.  There is no need for concern and it should clear up in a day or  so.  SYMPTOMS TO REPORT IMMEDIATELY:  Following lower endoscopy (colonoscopy or flexible sigmoidoscopy):  Excessive amounts of blood in the stool  Significant tenderness or worsening of abdominal pains  Swelling of the abdomen that is new, acute  Fever of 100F or higher  For  urgent or emergent issues, a gastroenterologist can be reached at any hour by calling [phone]. Do not use MyChart messaging for urgent concerns.   DIET:  We do recommend a small meal at first, but then you may proceed to your regular diet.  Drink plenty of fluids but you should avoid alcoholic beverages for 24 hours.  ACTIVITY:  You should plan to take it easy for the rest of today and you should NOT DRIVE or use heavy machinery until tomorrow (because of the sedation medicines used during the test).    FOLLOW UP: Our staff will call the number listed on your records the next business day following your procedure.  We will call around 7:15- 8:00 am to check on you and address any questions or concerns that you may have regarding the information given to you following your procedure. If we do not reach you, we will leave a message.     If any biopsies were taken you will be contacted by phone or by letter within the next 1-3 weeks.  Please call us at [phone] if you have not heard about the biopsies in 3 weeks.   SIGNATURES/CONFIDENTIALITY: You and/or your care partner have signed paperwork which will be entered into your electronic medical record.  These signatures attest to the fact that that the information above on your After Visit Summary has been reviewed and is understood.  Full responsibility of the confidentiality of this discharge information lies with you and/or your care-partner.

## 2022-05-21 NOTE — Op Note (Signed)
[facility] Patient Name: Wanda Douglas Procedure Date: 05/21/2022 8:45 AM MRN: [id number] Endoscopist: Mauri Pole , MD, [id number] Age: 52 Referring MD:  Date of Birth: [birth year] Gender: Female Account #: [id number] Procedure:                Colonoscopy Indications:              Screening for colorectal malignant neoplasm Medicines:                Monitored Anesthesia Care Procedure:                Pre-Anesthesia Assessment:                           - Prior to the procedure, a History and Physical                            was performed, and patient medications and                            allergies were reviewed. The patient's tolerance of                            previous anesthesia was also reviewed. The risks                            and benefits of the procedure and the sedation                            options and risks were discussed with the patient.                            All questions were answered, and informed consent                            was obtained. Prior Anticoagulants: The patient has                            taken no anticoagulant or antiplatelet agents. ASA                            Grade Assessment: II - A patient with mild systemic                            disease. After reviewing the risks and benefits,                            the patient was deemed in satisfactory condition to                            undergo the procedure.                           After obtaining informed consent, the colonoscope  was passed under direct vision. Throughout the                            procedure, the patient's blood pressure, pulse, and                            oxygen saturations were monitored continuously. The                            Olympus PCF-H190DL ([id number]) Colonoscope was                            introduced through the anus and advanced to the the                            cecum,  identified by appendiceal orifice and                            ileocecal valve. The colonoscopy was performed                            without difficulty. The patient tolerated the                            procedure well. The quality of the bowel                            preparation was good. The ileocecal valve,                            appendiceal orifice, and rectum were photographed. Scope In: 8:51:55 AM Scope Out: 9:06:36 AM Scope Withdrawal Time: 0 hours 11 minutes 15 seconds  Total Procedure Duration: 0 hours 14 minutes 41 seconds  Findings:                 The perianal and digital rectal examinations were                            normal.                           Three sessile polyps were found in the                            recto-sigmoid colon, transverse colon and ascending                            colon. The polyps were 4 to 7 mm in size. These                            polyps were removed with a cold snare. Resection                            and retrieval were complete.  Non-bleeding external and internal hemorrhoids were                            found during retroflexion. The hemorrhoids were                            medium-sized. Complications:            No immediate complications. Estimated Blood Loss:     Estimated blood loss was minimal. Impression:               - Three 4 to 7 mm polyps at the recto-sigmoid                            colon, in the transverse colon and in the ascending                            colon, removed with a cold snare. Resected and                            retrieved.                           - Non-bleeding external and internal hemorrhoids. Recommendation:           - Patient has a contact number available for                            emergencies. The signs and symptoms of potential                            delayed complications were discussed with the                            patient.  Return to normal activities tomorrow.                            Written discharge instructions were provided to the                            patient.                           - Resume previous diet.                           - Continue present medications.                           - Await pathology results.                           - Repeat colonoscopy in 3 - 5 years for                            surveillance based on pathology results. Mauri Pole, MD 05/21/2022 9:09:41 AM This report has been  signed electronically.

## 2022-05-21 NOTE — Progress Notes (Signed)
Called to room to assist during endoscopic procedure.  Patient ID and intended procedure confirmed with present staff. Received instructions for my participation in the procedure from the performing physician.

## 2022-05-21 NOTE — Progress Notes (Signed)
[facility] History and Physical   Primary Care Physician:  Unk Pinto, MD   Reason for Procedure:  Colorectal cancer screening  Plan:    Screening colonoscopy with possible interventions as needed     HPI: Wanda Douglas is a very pleasant 52 y.o. female here for screening colonoscopy. Denies any nausea, vomiting, abdominal pain, melena or bright red blood per rectum  The risks and benefits as well as alternatives of endoscopic procedure(s) have been discussed and reviewed. All questions answered. The patient agrees to proceed.    Past Medical History:  Diagnosis Date   Allergy    SEASONAL   Anemia    Arthritis    ANKLE,LEFT   Asthma    Bloody discharge from left nipple 07/2017   Cellulitis    History of COVID-19 04/29/2020   Hyperlipidemia    ELEVATED IN NOVEMBER,2023   Irregular menstrual cycle    Left breast mass 07/2017   Pituitary adenoma [facility])     Past Surgical History:  Procedure Laterality Date   BREAST DUCTAL SYSTEM EXCISION Left 07/26/2017   Procedure: EXCISION DUCTAL SYSTEM BREAST;  Surgeon: Rolm Bookbinder, MD;  Location: [facility];  Service: General;  Laterality: Left;   BREAST DUCTAL SYSTEM EXCISION Bilateral 05/23/2020   Procedure: BIL BREAST CENTRAL DUCT EXCISION;  Surgeon: Rolm Bookbinder, MD;  Location: [facility];  Service: General;  Laterality: Bilateral;   RADIOACTIVE SEED GUIDED EXCISIONAL BREAST BIOPSY Left 07/26/2017   Procedure: LEFT RADIOACTIVE SEED GUIDED EXCISIONAL BREAST BIOPSY ERAS PATHWAY;  Surgeon: Rolm Bookbinder, MD;  Location: [facility];  Service: General;  Laterality: Left;   RADIOACTIVE SEED GUIDED EXCISIONAL BREAST BIOPSY Right 06/16/2018   Procedure: RADIOACTIVE SEED GUIDED EXCISIONAL RIGHT BREAST BIOPSY;  Surgeon: Rolm Bookbinder, MD;  Location: [facility];  Service: General;  Laterality: Right;   right breast biopsy Right 2019    With axillary lymph node biopsy   TONSILLECTOMY      Prior to Admission medications   Medication Sig Start Date End Date Taking? Authorizing Provider  albuterol (VENTOLIN HFA) 108 (90 Base) MCG/ACT inhaler 1 to 2 puffs (5 minutes apart) every 4 hour as needed for Asthma 03/30/22  Yes Alycia Rossetti, NP  cetirizine (ZYRTEC) 10 MG tablet Take 1 tablet (10 mg total) by mouth daily. 03/30/22  Yes Alycia Rossetti, NP  cholecalciferol (VITAMIN D) 1000 UNITS tablet Take 1,000 Units by mouth daily.   Yes [provider]  hydrOXYzine (ATARAX) 25 MG tablet Take 0.5-1 tablets (12.5-25 mg total) by mouth every 8 (eight) hours as needed for itching. 05/14/22  Yes Jaynee Eagles, PA-C  Multiple Vitamin (MULTIVITAMIN) tablet Take 1 tablet by mouth daily.   Yes [provider]  Azelastine HCl 137 MCG/SPRAY SOLN PLACE 2 SPRAYS INTO BOTH NOSTRILS 2 (TWO) TIMES DAILY. USE IN EACH NOSTRIL AS DIRECTED 01/03/19   Garnet Sierras, NP  ciclopirox (PENLAC) 8 % solution Apply topically at bedtime. Apply over nail and surrounding skin. Apply daily over previous coat. After seven (7) days, may remove with alcohol and continue cycle. 03/30/22   Alycia Rossetti, NP  EPINEPHrine 0.3 mg/0.3 mL IJ SOAJ injection Inject 0.3 mg into the muscle as needed for anaphylaxis. 03/30/22   Alycia Rossetti, NP  fluticasone (FLONASE) 50 MCG/ACT nasal spray Place 1 spray into both nostrils daily for 3 days. 09/03/18 05/05/22  Janeece Fitting, PA-C  medroxyPROGESTERone (PROVERA) 5 MG tablet Take 1 tablet (5 mg total)  by mouth daily for 7 days. 1 tab PO daily x 7 days every 3 months if no natural menstrual period. 05/11/22 05/18/22  Princess Bruins, MD    Current Outpatient Medications  Medication Sig Dispense Refill   albuterol (VENTOLIN HFA) 108 (90 Base) MCG/ACT inhaler 1 to 2 puffs (5 minutes apart) every 4 hour as needed for Asthma 18 g 1   cetirizine (ZYRTEC) 10 MG tablet Take 1 tablet (10 mg total) by mouth daily. 90  tablet 1   cholecalciferol (VITAMIN D) 1000 UNITS tablet Take 1,000 Units by mouth daily.     hydrOXYzine (ATARAX) 25 MG tablet Take 0.5-1 tablets (12.5-25 mg total) by mouth every 8 (eight) hours as needed for itching. 30 tablet 0   Multiple Vitamin (MULTIVITAMIN) tablet Take 1 tablet by mouth daily.     Azelastine HCl 137 MCG/SPRAY SOLN PLACE 2 SPRAYS INTO BOTH NOSTRILS 2 (TWO) TIMES DAILY. USE IN EACH NOSTRIL AS DIRECTED 30 mL 1   ciclopirox (PENLAC) 8 % solution Apply topically at bedtime. Apply over nail and surrounding skin. Apply daily over previous coat. After seven (7) days, may remove with alcohol and continue cycle. 6.6 mL 0   EPINEPHrine 0.3 mg/0.3 mL IJ SOAJ injection Inject 0.3 mg into the muscle as needed for anaphylaxis. 1 each 1   fluticasone (FLONASE) 50 MCG/ACT nasal spray Place 1 spray into both nostrils daily for 3 days. 1 g 0   medroxyPROGESTERone (PROVERA) 5 MG tablet Take 1 tablet (5 mg total) by mouth daily for 7 days. 1 tab PO daily x 7 days every 3 months if no natural menstrual period. 7 tablet 4   Current Facility-Administered Medications  Medication Dose Route Frequency Provider Last Rate Last Admin   EPINEPHrine (EPI-PEN) injection 0.3 mg  0.3 mg Intramuscular Once Alycia Rossetti, NP        Allergies as of 05/21/2022 - Review Complete 05/21/2022  Allergen Reaction Noted   Banana Hives and Shortness Of Breath 12/19/2013   Peanut-containing drug products Hives and Shortness Of Breath 12/19/2013   Shellfish allergy Hives and Shortness Of Breath 12/19/2013   Chlorhexidine Hives 06/16/2018   Bactrim [sulfamethoxazole-trimethoprim] Rash 05/14/2022    Family History  Problem Relation Age of Onset   Hypertension Mother    Spina bifida Mother    Drug abuse Mother    Bladder Cancer Mother        smoker   Myopathy Father        Inclusion myolitis   Colon cancer Maternal Aunt        RARE FORM ASSOSIATED WITH HIV   Lung cancer Maternal Grandmother         smoker   Rheum arthritis Maternal Grandmother    Colon cancer Maternal Grandfather    Hypertension Maternal Grandfather    Diabetes Maternal Grandfather    Breast cancer Paternal Grandmother    Diabetes Paternal Grandmother    Bladder Cancer Paternal Grandmother    Colon polyps Neg Hx    Crohn's disease Neg Hx    Esophageal cancer Neg Hx    Rectal cancer Neg Hx    Stomach cancer Neg Hx    Ulcerative colitis Neg Hx     Social History   Socioeconomic History   Marital status: Single    Spouse name: Not on file   Number of children: Not on file   Years of education: Not on file   Highest education level: Not on file  Occupational History  Occupation: Microbiologist  Tobacco Use   Smoking status: Never    Passive exposure: Current (FAMILY MEMBERS SMOKE)   Smokeless tobacco: Never  Vaping Use   Vaping Use: Never used  Substance and Sexual Activity   Alcohol use: Yes    Comment: OCCASSIONALLY   Drug use: No   Sexual activity: Never    Comment: virgin  Other Topics Concern   Not on file  Social History Narrative   Not on file   Social Determinants of Health   Financial Resource Strain: Not on file  Food Insecurity: Not on file  Transportation Needs: Not on file  Physical Activity: Sufficiently Active (10/19/2017)   Exercise Vital Sign    Days of Exercise per Week: 7 days    Minutes of Exercise per Session: 30 min  Stress: No Stress Concern Present (10/19/2017)   [place]    Feeling of Stress : Only a little  Social Connections: Not on file  Intimate Partner Violence: Not on file    Review of Systems:  All other review of systems negative except as mentioned in the HPI.  Physical Exam: Vital signs in last 24 hours: Blood Pressure 100/66 (BP Location: Left Arm, Patient Position: Sitting, Cuff Size: Normal)   Pulse 75   Temperature (Abnormal) 96.9 F (36.1 C) (Temporal)   Height  '5\' 3"'$  (1.6 m)   Weight 260 lb (117.9 kg)   Last Menstrual Period 01/13/2022 (Approximate) Comment: Pt states not having sex pt states she is a virgin  Oxygen Saturation 96%   Body Mass Index 46.06 kg/m  General:   Alert, NAD Lungs:  Clear .   Heart:  Regular rate and rhythm Abdomen:  Soft, nontender and nondistended. Neuro/Psych:  Alert and cooperative. Normal mood and affect. A and O x 3  Reviewed labs, radiology imaging, old records and pertinent past GI work up  Patient is appropriate for planned procedure(s) and anesthesia in an ambulatory setting   K. Denzil Magnuson , MD [phone]

## 2022-05-21 NOTE — Progress Notes (Signed)
Vitals-DT  Pt's states no medical or surgical changes since previsit or office visit.  

## 2022-05-22 ENCOUNTER — Telehealth: Payer: Self-pay | Admitting: *Deleted

## 2022-05-22 NOTE — Telephone Encounter (Signed)
  Follow up Call-     05/21/2022    7:42 AM 05/21/2022    7:34 AM  Call back number  Post procedure Call Back phone  # [phone]   Permission to leave phone message  Yes     Patient questions:  Do you have a fever, pain , or abdominal swelling? No. Pain Score  0 *  Have you tolerated food without any problems? Yes.    Have you been able to return to your normal activities? Yes.    Do you have any questions about your discharge instructions: Diet   No. Medications  No. Follow up visit  No.  Do you have questions or concerns about your Care? No.  Actions: * If pain score is 4 or above: No action needed, pain <4.

## 2022-05-26 ENCOUNTER — Encounter: Payer: Self-pay | Admitting: Gastroenterology

## 2022-06-04 ENCOUNTER — Ambulatory Visit: Payer: BC Managed Care – PPO

## 2022-07-16 ENCOUNTER — Ambulatory Visit
Admission: RE | Admit: 2022-07-16 | Discharge: 2022-07-16 | Disposition: A | Discharge status: Home / Self Care | Payer: No Typology Code available for payment source | Source: Ambulatory Visit | Attending: Internal Medicine | Admitting: Internal Medicine

## 2022-07-16 DIAGNOSIS — Z1231 Encounter for screening mammogram for malignant neoplasm of breast: Secondary | ICD-10-CM

## 2022-08-03 ENCOUNTER — Ambulatory Visit
Admission: EM | Admit: 2022-08-03 | Discharge: 2022-08-03 | Disposition: A | Discharge status: Home / Self Care | Payer: Self-pay | Attending: Emergency Medicine | Admitting: Emergency Medicine

## 2022-08-03 DIAGNOSIS — S29019A Strain of muscle and tendon of unspecified wall of thorax, initial encounter: Secondary | ICD-10-CM

## 2022-08-03 DIAGNOSIS — S39012A Strain of muscle, fascia and tendon of lower back, initial encounter: Secondary | ICD-10-CM

## 2022-08-03 MED ORDER — DICLOFENAC SODIUM 75 MG PO TBEC: 75.0000 mg | DELAYED_RELEASE_TABLET | Freq: Two times a day (BID) | ORAL | 0 refills | Status: AC

## 2022-08-03 MED ORDER — BACLOFEN 10 MG PO TABS: 5.0000 mg | ORAL_TABLET | Freq: Three times a day (TID) | ORAL | 0 refills | Status: DC | PRN

## 2022-08-03 NOTE — Discharge Instructions (Addendum)
Rest, heat, and gentle stretching can help with the stiffness and soreness. If develop worsening discomfort take diclofenac as prescribed. If worsening tightness or develop spasms, take baclofen as prescribed which is a muscle relaxer and may cause drowsiness. It is common for stiffness and soreness to worsen over the first 24-48hr and then gradually improve. Follow-up with PCP or ortho if no improvement in a week.  Go to the ER if develop difficulty breathing, incontinence, severe headache or vision changes.
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
## 2022-09-30 ENCOUNTER — Ambulatory Visit: Payer: Self-pay | Admitting: Nurse Practitioner

## 2022-10-05 ENCOUNTER — Ambulatory Visit: Payer: Self-pay | Admitting: Nurse Practitioner

## 2023-02-24 ENCOUNTER — Encounter: Payer: BC Managed Care – PPO | Admitting: Nurse Practitioner

## 2023-03-30 NOTE — Progress Notes (Deleted)
 Complete Physical  Assessment and Plan:  Diagnoses and all orders for this visit:  Encounter for general adult medical examination with abnormal findings She is requesting referral to GYN Yearly  Intrinsic asthma Well controlled, avoid triggers, continue inhalers Albuterol inhaler refilled today  Prolactin secreting Pituitary Adenoma (1997) Followed by Dr. Lucianne Muss, responded well to cabogoline -  Check protactin, will refer back if elevated   Multiple food allergies Avoid triggers, doing well on OTC agents  Intraductal papilloma Continue annual mammograms Established with Dr. Dwain Sarna to follow up   HX of seasonal allergies Continue OTC allergy pills  Mixed hyperlipidemia Mild elevations currently treated by lifestyle modification only Continue low cholesterol diet and exercise.  Check lipid panel.  CMP TSH  Abnormal Glucose Continue diet and exercise A1C  Medication Management CBC   Screening for proteinuria or hematuria Routine urine with Reflex urine culture Microalbumin/creatinine urine ratio  Screening for ischemic heart disease EKG  Screening for AAA - ABD U/S RETROPERITONEAL LTD  Flu Vaccine Need Flu Quad 6 MOS + PF IM  Morbid obesity Long discussion about weight loss, diet, and exercise Fair life protein shakes Eat more frequently - try not to go more than 6 hours without protein Aim for 90 grams of protein a day- 30 breakfast/30 lunch 30 dinner Try to keep net carbs less than 50 Net Carbs=Total Carbs-fiber- sugar alcohols Exercise heartrate 120-140(fat burning zone)- walking 20-30 minutes 4 days a week Phendimetrazine 1/2-1 tab twice a day as needed for appetite control, notify office if develops chest pain, shortness of breath or palpitations   Multiple Food Allergies Epipen sent to pharmacy  Refer to gastroenterology - Colonoscopy  Refer to GYN - Pelvic exam  Onychomycosis - Penlac polish BID  Discussed med's effects and SE's.  Screening labs and tests as requested with regular follow-up as recommended. Over 40 minutes of exam, counseling, chart review, and complex, high level critical decision making was performed this visit.   Future Appointments  Date Time Provider Department Center  03/31/2023  9:00 AM Raynelle Dick, NP [facility] None  04/03/2024  9:00 AM Raynelle Dick, NP [facility] None     HPI  52 y.o. AA female, presents for a complete physical and follow up for has Intrinsic asthma; Multiple food allergies; Seasonal allergies; Prolactin secreting Pituitary Adenoma (1997); Intraductal papilloma; Hyperlipidemia; Morbid obesity with BMI of 40.0-44.9, adult (HCC); Vitamin D deficiency; Insulin resistance; and COVID-19 (04/30/2020) on their problem list.    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 She does have worse pain in left ankle and uses Ibuprofen as needed.  Recent follow up MRI for pituitary adenoma . Had biopsy of left and right which showed intraductal papilloma. Prolactin 02/2020 93.0. Pt saw Dr. Lucianne Muss 02/19/20- has benign 7mm pituitary adenoma He prescribed Dostinex 0.25mg  twice a weeks and wanted her to follow up in 2 months. Recommend would take until menopause. Right and left central duct removal 05/23/20 by Dr. Dwain Sarna. Denies nipple discharge currently has not been following with Dr. Lucianne Muss or taking Dostinex.  Plans to schedule an appointment and will check prolactin today  She does need a referral to GYN for pelvic exam and Gastroenterology for colonoscopy  She has multiple allergies and mild intermittent asthma, zyrtec, flonase, off of breo, just albuterol PRN  and continues to do well with this, arely needing except during the fall.    BMI is There is no height or weight on file to calculate  BMI., she has been working on diet and exercise,  Noticing more weight gain.  Unable to take Phentermine due to elevated heart rate and blood pressure. She has tried optavia but it did not work well for her.  Wt Readings from Last 3 Encounters:  05/21/22 260 lb (117.9 kg)  05/11/22 257 lb (116.6 kg)  05/05/22 254 lb (115.2 kg)   Her blood pressure has been controlled at home, today their BP is    BP Readings from Last 3 Encounters:  08/03/22 130/85  05/21/22 112/68  05/14/22 126/78    She does workout. She denies chest pain, shortness of breath, dizziness.   She is not on cholesterol medication and denies myalgias. Her cholesterol is not at goal. The cholesterol last visit was:   Lab Results  Component Value Date   CHOL 203 (H) 03/30/2022   HDL 60 03/30/2022   LDLCALC 127 (H) 03/30/2022   TRIG 70 03/30/2022   CHOLHDL 3.4 03/30/2022   Last Y8M in the office was:  Lab Results  Component Value Date   HGBA1C 5.8 (H) 03/30/2022   Last GFR: Lab Results  Component Value Date   GFRAA 81 02/05/2020   Patient is on Vitamin D supplement, 5000 IU daily    Lab Results  Component Value Date   VD25OH 60 02/20/2021        Current Medications:  Current Outpatient Medications on File Prior to Visit  Medication Sig Dispense Refill   albuterol (VENTOLIN HFA) 108 (90 Base) MCG/ACT inhaler 1 to 2 puffs (5 minutes apart) every 4 hour as needed for Asthma 18 g 1   Azelastine HCl 137 MCG/SPRAY SOLN PLACE 2 SPRAYS INTO BOTH NOSTRILS 2 (TWO) TIMES DAILY. USE IN EACH NOSTRIL AS DIRECTED 30 mL 1   baclofen (LIORESAL) 10 MG tablet Take 0.5-1 tablets (5-10 mg total) by mouth every 8 (eight) hours as needed for muscle spasms. 21 each 0   cetirizine (ZYRTEC) 10 MG tablet Take 1 tablet (10 mg total) by mouth daily. 90 tablet 1   cholecalciferol (VITAMIN D) 1000 UNITS tablet Take 1,000 Units by mouth daily.     ciclopirox (PENLAC) 8 % solution Apply topically at bedtime. Apply over nail and  surrounding skin. Apply daily over previous coat. After seven (7) days, may remove with alcohol and continue cycle. 6.6 mL 0   EPINEPHrine 0.3 mg/0.3 mL IJ SOAJ injection Inject 0.3 mg into the muscle as needed for anaphylaxis. 1 each 1   fluticasone (FLONASE) 50 MCG/ACT nasal spray Place 1 spray into both nostrils daily for 3 days. 1 g 0   hydrOXYzine (ATARAX) 25 MG tablet Take 0.5-1 tablets (12.5-25 mg total) by mouth every 8 (eight) hours as needed for itching. 30 tablet 0   medroxyPROGESTERone (PROVERA) 5 MG tablet Take 1 tablet (5 mg total) by mouth daily for 7 days. 1 tab PO daily x 7 days every 3 months if no natural menstrual period. 7 tablet 4   Multiple Vitamin (MULTIVITAMIN) tablet Take 1 tablet by mouth daily.     Current Facility-Administered Medications on File Prior to Visit  Medication Dose Route Frequency Provider Last Rate Last Admin   EPINEPHrine (EPI-PEN) injection 0.3 mg  0.3 mg Intramuscular Once Raynelle Dick, NP       Allergies:  Allergies  Allergen Reactions  Banana Hives and Shortness Of Breath   Peanut-Containing Drug Products Hives and Shortness Of Breath    All nuts (allergic) hives    Shellfish Allergy Hives and Shortness Of Breath   Chlorhexidine Hives   Bactrim [Sulfamethoxazole-Trimethoprim] Rash   Medical History:  She has Intrinsic asthma; Multiple food allergies; Seasonal allergies; Prolactin secreting Pituitary Adenoma (1997); Intraductal papilloma; Hyperlipidemia; Morbid obesity with BMI of 40.0-44.9, adult (HCC); Vitamin D deficiency; Insulin resistance; and COVID-19 (04/30/2020) on their problem list. Health Maintenance:   Immunization History  Administered Date(s) Administered   Influenza Inj Mdck Quad With Preservative 03/06/2020   Influenza,inj,Quad PF,6+ Mos 02/20/2021, 03/30/2022   Moderna Sars-Covid-2 Vaccination 08/03/2019, 09/05/2019   Tdap 10/19/2017    Tetanus: 2019 Pneumovax:-  Prevnar 13: - Flu vaccine: declines  Zostavax:  n/a  Covid 19: 2/2, 2021, moderna  LMP: No LMP recorded. Patient is perimenopausal.  Pap: Remote, never sexually active, will follow up with GYN, has number to schedule MGM: 01/29/2020 bil diagnostic, MRI 03/2020 DEXA: n/a  Colonoscopy: never, due age 17 EGD: -  Last Dental Exam: Dr. ?, last 3 years ago, will schedule  Last Eye Exam: 2021, wears glasses  Patient Care Team: Lucky Cowboy, MD as PCP - General (Internal Medicine)  Surgical History:  She has a past surgical history that includes Tonsillectomy; Radioactive seed guided excisional breast biopsy (Left, 07/26/2017); Breast ductal system excision (Left, 07/26/2017); right breast biopsy (Right, 2019); Radioactive seed guided excisional breast biopsy (Right, 06/16/2018); and Breast ductal system excision (Bilateral, 05/23/2020). Family History:  Herfamily history includes Bladder Cancer in her mother and paternal grandmother; Breast cancer in her paternal grandmother; Colon cancer in her maternal aunt and maternal grandfather; Diabetes in her maternal grandfather and paternal grandmother; Drug abuse in her mother; Hypertension in her maternal grandfather and mother; Lung cancer in her maternal grandmother; Myopathy in her father; Rheum arthritis in her maternal grandmother; Spina bifida in her mother. Social History:  She reports that she has never smoked. She has been exposed to tobacco smoke. She has never used smokeless tobacco. She reports current alcohol use. She reports that she does not use drugs.  Review of Systems: Review of Systems  Constitutional:  Negative for chills, fever, malaise/fatigue and weight loss.       Weight gain  HENT:  Negative for congestion, hearing loss, sinus pain, sore throat and tinnitus.   Eyes:  Negative for blurred vision and double vision.  Respiratory:  Negative for cough, hemoptysis, sputum production, shortness of breath and wheezing.   Cardiovascular:  Negative for chest pain, palpitations,  orthopnea, claudication, leg swelling and PND.  Gastrointestinal:  Negative for abdominal pain, blood in stool, constipation, diarrhea, heartburn, melena, nausea and vomiting.  Genitourinary: Negative.  Negative for dysuria and urgency.  Musculoskeletal:  Positive for joint pain (knees and ankles). Negative for back pain, falls, myalgias and neck pain.  Skin:  Negative for rash.       Toenail fungus  Neurological:  Negative for dizziness, tingling, tremors, sensory change, weakness and headaches.  Endo/Heme/Allergies:  Negative for polydipsia. Does not bruise/bleed easily.  Psychiatric/Behavioral: Negative.  Negative for depression, memory loss, substance abuse and suicidal ideas. The patient is not nervous/anxious and does not have insomnia.   All other systems reviewed and are negative.   Physical Exam: Estimated body mass index is 46.06 kg/m as calculated from the following:   Height as of 05/21/22: 5\' 3"  (1.6 m).   Weight as of 05/21/22: 260 lb (117.9 kg).  There were no vitals taken for this visit. General Appearance: Very pleasant obese female in no apparent distress.  Eyes: PERRLA, EOMs, conjunctiva no swelling or erythema Sinuses: No Frontal/maxillary tenderness  ENT/Mouth: Ext aud canals clear, normal light reflex with TMs without erythema, bulging. Good dentition. No erythema, swelling, or exudate on post pharynx. Tonsils not swollen or erythematous. Hearing normal.  Neck: Supple, thyroid normal. No bruits  Respiratory: Respiratory effort normal, BS equal bilaterally without rales, rhonchi, wheezing or stridor.  Cardio: RRR without murmurs, rubs or gallops. Brisk peripheral pulses without edema.  Chest: symmetric, with normal excursions and percussion.  Breasts: breasts appear normal, no suspicious masses, no skin or nipple changes or axillary nodes.  Abdomen: Soft, nontender, no guarding, rebound, hernias, masses, or organomegaly.  Lymphatics: Non tender without lymphadenopathy.   Genitourinary: Defer to GYN, will schedule soon  Musculoskeletal: Full ROM all peripheral extremities,5/5 strength, and normal gait.  Skin: Warm, dry without rashes, lesions, ecchymosis. Onychomycosis of great toenails Neuro: Cranial nerves intact, reflexes equal bilaterally. Normal muscle tone, no cerebellar symptoms. Sensation intact.  Psych: Awake and oriented X 3, normal affect, Insight and Judgment appropriate.   EKG: Normal Sinus rhythm, no ST changes AAA: < 3 cm   Kaian Fahs E  12:45 PM [facility]

## 2023-03-31 ENCOUNTER — Encounter: Payer: BC Managed Care – PPO | Admitting: Nurse Practitioner

## 2023-03-31 DIAGNOSIS — Z1329 Encounter for screening for other suspected endocrine disorder: Secondary | ICD-10-CM

## 2023-03-31 DIAGNOSIS — Z136 Encounter for screening for cardiovascular disorders: Secondary | ICD-10-CM

## 2023-03-31 DIAGNOSIS — R7309 Other abnormal glucose: Secondary | ICD-10-CM

## 2023-03-31 DIAGNOSIS — D369 Benign neoplasm, unspecified site: Secondary | ICD-10-CM

## 2023-03-31 DIAGNOSIS — Z0001 Encounter for general adult medical examination with abnormal findings: Secondary | ICD-10-CM

## 2023-03-31 DIAGNOSIS — D352 Benign neoplasm of pituitary gland: Secondary | ICD-10-CM

## 2023-03-31 DIAGNOSIS — E782 Mixed hyperlipidemia: Secondary | ICD-10-CM

## 2023-03-31 DIAGNOSIS — J45909 Unspecified asthma, uncomplicated: Secondary | ICD-10-CM

## 2023-03-31 DIAGNOSIS — Z91018 Allergy to other foods: Secondary | ICD-10-CM

## 2023-03-31 DIAGNOSIS — Z1389 Encounter for screening for other disorder: Secondary | ICD-10-CM

## 2023-03-31 DIAGNOSIS — J302 Other seasonal allergic rhinitis: Secondary | ICD-10-CM

## 2023-03-31 DIAGNOSIS — E559 Vitamin D deficiency, unspecified: Secondary | ICD-10-CM

## 2023-03-31 DIAGNOSIS — Z79899 Other long term (current) drug therapy: Secondary | ICD-10-CM

## 2023-04-12 NOTE — Progress Notes (Unsigned)
 Complete Physical  Assessment and Plan:  Diagnoses and all orders for this visit:  Encounter for general adult medical examination with abnormal findings She is requesting referral to GYN Yearly  Intrinsic asthma Well controlled, avoid triggers, continue inhalers Albuterol inhaler refilled today  Prolactin secreting Pituitary Adenoma (1997) Followed by Dr. Lucianne Muss, responded well to cabogoline -  Check protactin, will refer back if elevated   Multiple food allergies Avoid triggers, doing well on OTC agents  Intraductal papilloma Continue annual mammograms Established with Dr. Dwain Sarna to follow up   HX of seasonal allergies Continue OTC allergy pills  Mixed hyperlipidemia Mild elevations currently treated by lifestyle modification only Continue low cholesterol diet and exercise.  Check lipid panel.  CMP TSH  Abnormal Glucose Continue diet and exercise A1C  Medication Management CBC   Screening for proteinuria or hematuria Routine urine with Reflex urine culture Microalbumin/creatinine urine ratio  Screening for ischemic heart disease EKG  Screening for AAA - ABD U/S RETROPERITONEAL LTD  Flu Vaccine Need Flu Quad 6 MOS + PF IM  Morbid obesity Long discussion about weight loss, diet, and exercise Fair life protein shakes Eat more frequently - try not to go more than 6 hours without protein Aim for 90 grams of protein a day- 30 breakfast/30 lunch 30 dinner Try to keep net carbs less than 50 Net Carbs=Total Carbs-fiber- sugar alcohols Exercise heartrate 120-140(fat burning zone)- walking 20-30 minutes 4 days a week Phendimetrazine 1/2-1 tab twice a day as needed for appetite control, notify office if develops chest pain, shortness of breath or palpitations   Multiple Food Allergies Epipen sent to pharmacy    Discussed med's effects and SE's. Screening labs and tests as requested with regular follow-up as recommended. Over 40 minutes of exam,  counseling, chart review, and complex, high level critical decision making was performed this visit.   Future Appointments  Date Time Provider Department Center  04/13/2023 11:30 AM Raynelle Dick, NP [facility] None  04/12/2024 10:00 AM Raynelle Dick, NP [facility] None     HPI  52 y.o. AA female, presents for a complete physical and follow up for has Intrinsic asthma; Multiple food allergies; Seasonal allergies; Prolactin secreting Pituitary Adenoma (1997); Intraductal papilloma; Hyperlipidemia; Morbid obesity with BMI of 40.0-44.9, adult (HCC); Vitamin D deficiency; Insulin resistance; and COVID-19 (04/30/2020) on their problem list.    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 She does have worse pain in left ankle and uses Ibuprofen as needed.  Recent follow up MRI for pituitary adenoma . Had biopsy of left and right which showed intraductal papilloma. Prolactin 02/2020 93.0. Pt saw Dr. Lucianne Muss 02/19/20- has benign 7mm pituitary adenoma He prescribed Dostinex 0.25mg  twice a weeks and wanted her to follow up in 2 months. Recommend would take until menopause. Right and left central duct removal 05/23/20 by Dr. Dwain Sarna. Denies nipple discharge currently has not been following with Dr. Lucianne Muss or taking Dostinex.  Plans to schedule an appointment and will check prolactin today  She does need a referral to GYN for pelvic exam and Gastroenterology for colonoscopy  She has multiple allergies and mild intermittent asthma, zyrtec, flonase, off of breo, just albuterol PRN and continues to do well with this, arely needing except during the fall.    BMI is There  is no height or weight on file to calculate BMI., she has been working on diet and exercise,  Noticing more weight gain.  Unable to take Phentermine  due to elevated heart rate and blood pressure. She has tried optavia but it did not work well for her.  Wt Readings from Last 3 Encounters:  05/21/22 260 lb (117.9 kg)  05/11/22 257 lb (116.6 kg)  05/05/22 254 lb (115.2 kg)   Her blood pressure has been controlled at home, today their BP is    BP Readings from Last 3 Encounters:  08/03/22 130/85  05/21/22 112/68  05/14/22 126/78    She does workout. She denies chest pain, shortness of breath, dizziness.   She is not on cholesterol medication and denies myalgias. Her cholesterol is not at goal. The cholesterol last visit was:   Lab Results  Component Value Date   CHOL 203 (H) 03/30/2022   HDL 60 03/30/2022   LDLCALC 127 (H) 03/30/2022   TRIG 70 03/30/2022   CHOLHDL 3.4 03/30/2022   Last Q4O in the office was:  Lab Results  Component Value Date   HGBA1C 5.8 (H) 03/30/2022   Last GFR: Lab Results  Component Value Date   GFRAA 81 02/05/2020   Patient is on Vitamin D supplement, 5000 IU daily    Lab Results  Component Value Date   VD25OH 59 02/20/2021        Current Medications:  Current Outpatient Medications on File Prior to Visit  Medication Sig Dispense Refill   albuterol (VENTOLIN HFA) 108 (90 Base) MCG/ACT inhaler 1 to 2 puffs (5 minutes apart) every 4 hour as needed for Asthma 18 g 1   Azelastine HCl 137 MCG/SPRAY SOLN PLACE 2 SPRAYS INTO BOTH NOSTRILS 2 (TWO) TIMES DAILY. USE IN EACH NOSTRIL AS DIRECTED 30 mL 1   baclofen (LIORESAL) 10 MG tablet Take 0.5-1 tablets (5-10 mg total) by mouth every 8 (eight) hours as needed for muscle spasms. 21 each 0   cetirizine (ZYRTEC) 10 MG tablet Take 1 tablet (10 mg total) by mouth daily. 90 tablet 1   cholecalciferol (VITAMIN D) 1000 UNITS tablet Take 1,000 Units by mouth daily.     ciclopirox (PENLAC) 8 % solution Apply topically at bedtime. Apply over nail and surrounding skin. Apply daily over previous coat. After seven (7) days, may remove with alcohol and continue cycle.  6.6 mL 0   EPINEPHrine 0.3 mg/0.3 mL IJ SOAJ injection Inject 0.3 mg into the muscle as needed for anaphylaxis. 1 each 1   fluticasone (FLONASE) 50 MCG/ACT nasal spray Place 1 spray into both nostrils daily for 3 days. 1 g 0   hydrOXYzine (ATARAX) 25 MG tablet Take 0.5-1 tablets (12.5-25 mg total) by mouth every 8 (eight) hours as needed for itching. 30 tablet 0   medroxyPROGESTERone (PROVERA) 5 MG tablet Take 1 tablet (5 mg total) by mouth daily for 7 days. 1 tab PO daily x 7 days every 3 months if no natural menstrual period. 7 tablet 4   Multiple Vitamin (MULTIVITAMIN) tablet Take 1 tablet by mouth daily.     Current Facility-Administered Medications on File Prior to Visit  Medication Dose Route Frequency Provider Last Rate Last Admin   EPINEPHrine (EPI-PEN) injection 0.3 mg  0.3 mg Intramuscular Once Raynelle Dick, NP       Allergies:  Allergies  Allergen Reactions   Banana Hives and Shortness Of Breath   Peanut-Containing Drug Products Hives and Shortness Of Breath  All nuts (allergic) hives    Shellfish Allergy Hives and Shortness Of Breath   Chlorhexidine Hives   Bactrim [Sulfamethoxazole-Trimethoprim] Rash   Medical History:  She has Intrinsic asthma; Multiple food allergies; Seasonal allergies; Prolactin secreting Pituitary Adenoma (1997); Intraductal papilloma; Hyperlipidemia; Morbid obesity with BMI of 40.0-44.9, adult (HCC); Vitamin D deficiency; Insulin resistance; and COVID-19 (04/30/2020) on their problem list. Health Maintenance:   Immunization History  Administered Date(s) Administered   Influenza Inj Mdck Quad With Preservative 03/06/2020   Influenza,inj,Quad PF,6+ Mos 02/20/2021, 03/30/2022   Moderna Sars-Covid-2 Vaccination 08/03/2019, 09/05/2019   Tdap 10/19/2017    Tetanus: 2019 Pneumovax:-  Prevnar 13: - Flu vaccine: declines  Zostavax: n/a  Covid 19: 2/2, 2021, moderna  LMP: No LMP recorded. Patient is perimenopausal.  Pap: Remote, never sexually  active, will follow up with GYN, has number to schedule MGM: 01/29/2020 bil diagnostic, MRI 03/2020 DEXA: n/a  Colonoscopy: never, due age 9 EGD: -  Last Dental Exam: Dr. ?, last 3 years ago, will schedule  Last Eye Exam: 2021, wears glasses  Patient Care Team: Lucky Cowboy, MD as PCP - General (Internal Medicine)  Surgical History:  She has a past surgical history that includes Tonsillectomy; Radioactive seed guided excisional breast biopsy (Left, 07/26/2017); Breast ductal system excision (Left, 07/26/2017); right breast biopsy (Right, 2019); Radioactive seed guided excisional breast biopsy (Right, 06/16/2018); and Breast ductal system excision (Bilateral, 05/23/2020). Family History:  Herfamily history includes Bladder Cancer in her mother and paternal grandmother; Breast cancer in her paternal grandmother; Colon cancer in her maternal aunt and maternal grandfather; Diabetes in her maternal grandfather and paternal grandmother; Drug abuse in her mother; Hypertension in her maternal grandfather and mother; Lung cancer in her maternal grandmother; Myopathy in her father; Rheum arthritis in her maternal grandmother; Spina bifida in her mother. Social History:  She reports that she has never smoked. She has been exposed to tobacco smoke. She has never used smokeless tobacco. She reports current alcohol use. She reports that she does not use drugs.  Review of Systems: Review of Systems  Constitutional:  Negative for chills, fever, malaise/fatigue and weight loss.       Weight gain  HENT:  Negative for congestion, hearing loss, sinus pain, sore throat and tinnitus.   Eyes:  Negative for blurred vision and double vision.  Respiratory:  Negative for cough, hemoptysis, sputum production, shortness of breath and wheezing.   Cardiovascular:  Negative for chest pain, palpitations, orthopnea, claudication, leg swelling and PND.  Gastrointestinal:  Negative for abdominal pain, blood in stool,  constipation, diarrhea, heartburn, melena, nausea and vomiting.  Genitourinary: Negative.  Negative for dysuria and urgency.  Musculoskeletal:  Positive for joint pain (knees and ankles). Negative for back pain, falls, myalgias and neck pain.  Skin:  Negative for rash.       Toenail fungus  Neurological:  Negative for dizziness, tingling, tremors, sensory change, weakness and headaches.  Endo/Heme/Allergies:  Negative for polydipsia. Does not bruise/bleed easily.  Psychiatric/Behavioral: Negative.  Negative for depression, memory loss, substance abuse and suicidal ideas. The patient is not nervous/anxious and does not have insomnia.   All other systems reviewed and are negative.   Physical Exam: Estimated body mass index is 46.06 kg/m as calculated from the following:   Height as of 05/21/22: 5\' 3"  (1.6 m).   Weight as of 05/21/22: 260 lb (117.9 kg). There were no vitals taken for this visit. General Appearance: Very pleasant obese female in no apparent distress.  Eyes: PERRLA, EOMs, conjunctiva no swelling or erythema Sinuses: No Frontal/maxillary tenderness  ENT/Mouth: Ext aud canals clear, normal light reflex with TMs without erythema, bulging. Good dentition. No erythema, swelling, or exudate on post pharynx. Tonsils not swollen or erythematous. Hearing normal.  Neck: Supple, thyroid normal. No bruits  Respiratory: Respiratory effort normal, BS equal bilaterally without rales, rhonchi, wheezing or stridor.  Cardio: RRR without murmurs, rubs or gallops. Brisk peripheral pulses without edema.  Chest: symmetric, with normal excursions and percussion.  Breasts: breasts appear normal, no suspicious masses, no skin or nipple changes or axillary nodes.  Abdomen: Soft, nontender, no guarding, rebound, hernias, masses, or organomegaly.  Lymphatics: Non tender without lymphadenopathy.  Genitourinary: Defer to GYN, will schedule soon  Musculoskeletal: Full ROM all peripheral extremities,5/5  strength, and normal gait.  Skin: Warm, dry without rashes, lesions, ecchymosis. Onychomycosis of great toenails Neuro: Cranial nerves intact, reflexes equal bilaterally. Normal muscle tone, no cerebellar symptoms. Sensation intact.  Psych: Awake and oriented X 3, normal affect, Insight and Judgment appropriate.   EKG: Normal Sinus rhythm, no ST changes AAA: < 3 cm   Wanda Douglas  12:48 PM [facility]

## 2023-04-13 ENCOUNTER — Encounter: Payer: Self-pay | Admitting: Nurse Practitioner

## 2023-04-13 ENCOUNTER — Ambulatory Visit (INDEPENDENT_AMBULATORY_CARE_PROVIDER_SITE_OTHER): Payer: Self-pay | Admitting: Nurse Practitioner

## 2023-04-13 VITALS — BP 112/72 | HR 91 | Temp 98.1°F | Ht 63.0 in | Wt 263.6 lb

## 2023-04-13 DIAGNOSIS — I700 Atherosclerosis of aorta: Secondary | ICD-10-CM

## 2023-04-13 DIAGNOSIS — E782 Mixed hyperlipidemia: Secondary | ICD-10-CM

## 2023-04-13 DIAGNOSIS — J45909 Unspecified asthma, uncomplicated: Secondary | ICD-10-CM

## 2023-04-13 DIAGNOSIS — Z0000 Encounter for general adult medical examination without abnormal findings: Secondary | ICD-10-CM

## 2023-04-13 DIAGNOSIS — Z23 Encounter for immunization: Secondary | ICD-10-CM

## 2023-04-13 DIAGNOSIS — E559 Vitamin D deficiency, unspecified: Secondary | ICD-10-CM | POA: Diagnosis not present

## 2023-04-13 DIAGNOSIS — D352 Benign neoplasm of pituitary gland: Secondary | ICD-10-CM

## 2023-04-13 DIAGNOSIS — I10 Essential (primary) hypertension: Secondary | ICD-10-CM

## 2023-04-13 DIAGNOSIS — Z79899 Other long term (current) drug therapy: Secondary | ICD-10-CM | POA: Diagnosis not present

## 2023-04-13 DIAGNOSIS — Z131 Encounter for screening for diabetes mellitus: Secondary | ICD-10-CM

## 2023-04-13 DIAGNOSIS — G43009 Migraine without aura, not intractable, without status migrainosus: Secondary | ICD-10-CM

## 2023-04-13 DIAGNOSIS — Z13220 Encounter for screening for lipoid disorders: Secondary | ICD-10-CM | POA: Diagnosis not present

## 2023-04-13 DIAGNOSIS — D369 Benign neoplasm, unspecified site: Secondary | ICD-10-CM

## 2023-04-13 DIAGNOSIS — Z1329 Encounter for screening for other suspected endocrine disorder: Secondary | ICD-10-CM

## 2023-04-13 DIAGNOSIS — Z136 Encounter for screening for cardiovascular disorders: Secondary | ICD-10-CM | POA: Diagnosis not present

## 2023-04-13 DIAGNOSIS — G5603 Carpal tunnel syndrome, bilateral upper limbs: Secondary | ICD-10-CM

## 2023-04-13 DIAGNOSIS — Z0001 Encounter for general adult medical examination with abnormal findings: Secondary | ICD-10-CM

## 2023-04-13 DIAGNOSIS — Z91018 Allergy to other foods: Secondary | ICD-10-CM

## 2023-04-13 DIAGNOSIS — Z1389 Encounter for screening for other disorder: Secondary | ICD-10-CM

## 2023-04-13 DIAGNOSIS — R7309 Other abnormal glucose: Secondary | ICD-10-CM

## 2023-04-13 MED ORDER — ALBUTEROL SULFATE HFA 108 (90 BASE) MCG/ACT IN AERS: INHALATION_SPRAY | RESPIRATORY_TRACT | 1 refills | Status: DC

## 2023-04-13 MED ORDER — SUMATRIPTAN SUCCINATE 50 MG PO TABS: 50.0000 mg | ORAL_TABLET | Freq: Once | ORAL | 2 refills | Status: AC | PRN

## 2023-04-13 MED ORDER — WEGOVY 0.5 MG/0.5ML ~~LOC~~ SOAJ: 0.5000 mg | SUBCUTANEOUS | 2 refills | Status: DC

## 2023-04-13 MED ORDER — EPINEPHRINE 0.3 MG/0.3ML IJ SOAJ: 0.3000 mg | INTRAMUSCULAR | 1 refills | Status: DC | PRN

## 2023-04-13 NOTE — Patient Instructions (Signed)
Wear cock up splints at night on both hands Carpal Tunnel Syndrome  Carpal tunnel syndrome is a condition that causes pain, weakness, and numbness in your hand and arm. Numbness is when you cannot feel an area in your body. The carpal tunnel is a narrow area that is on the palm side of your wrist. Repeated wrist motion or certain diseases may cause swelling in the tunnel. This swelling can pinch the main nerve in the wrist. This nerve is called the median nerve. What are the causes? This condition may be caused by: Moving your hand and wrist over and over again while doing a task. Injury to the wrist. Arthritis. A sac of fluid (cyst) or abnormal growth (tumor) in the carpal tunnel. Fluid buildup during pregnancy. Use of tools that vibrate. Sometimes the cause is not known. What increases the risk? The following factors may make you more likely to have this condition: Having a job that makes you do these things: Move your hand over and over again. Work with tools that vibrate, such as drills or sanders. Being a woman. Having diabetes, obesity, thyroid problems, or kidney failure. What are the signs or symptoms? Symptoms of this condition include: A tingling feeling in your fingers. Tingling or loss of feeling in your hand. Pain in your entire arm. This pain may get worse when you bend your wrist and elbow for a long time. Pain in your wrist that goes up your arm to your shoulder. Pain that goes down into your palm or fingers. Weakness in your hands. You may find it hard to grab and hold items. You may feel worse at night. How is this treated? This condition may be treated with: Lifestyle changes. You will be asked to stop or change the activity that caused your problem. Doing exercises and activities that make bones, muscles, and tendons stronger (physical therapy). Learning how to use your hand again (occupational therapy). Medicines for pain and swelling. You may have injections in  your wrist. A wrist splint or brace. Surgery. Follow these instructions at home: If you have a splint or brace: Wear the splint or brace as told by your doctor. Take it off only as told by your doctor. Loosen the splint if your fingers: Tingle. Become numb. Turn cold and blue. Keep the splint or brace clean. If the splint or brace is not waterproof: Do not let it get wet. Cover it with a watertight covering when you take a bath or a shower. Managing pain, stiffness, and swelling If told, put ice on the painful area: If you have a removable splint or brace, remove it as told by your doctor. Put ice in a plastic bag. Place a towel between your skin and the bag. Leave the ice on for 20 minutes, 2-3 times per day. Do not fall asleep with the cold pack on your skin. Take off the ice if your skin turns bright red. This is very important. If you cannot feel pain, heat, or cold, you have a greater risk of damage to the area. Move your fingers often to reduce stiffness and swelling. General instructions Take over-the-counter and prescription medicines only as told by your doctor. Rest your wrist from any activity that may cause pain. If needed, talk with your boss at work about changes that can help your wrist heal. Do exercises as told by your doctor, physical therapist, or occupational therapist. Keep all follow-up visits. Contact a doctor if: You have new symptoms. Medicine does not help  your pain. Your symptoms get worse. Get help right away if: You have very bad numbness or tingling in your wrist or hand. Summary Carpal tunnel syndrome is a condition that causes pain in your hand and arm. It is often caused by repeated wrist motions. Lifestyle changes and medicines are used to treat this problem. Surgery may help in very bad cases. Follow your doctor's instructions about wearing a splint, resting your wrist, keeping follow-up visits, and calling for help. This information is not  intended to replace advice given to you by your health care provider. Make sure you discuss any questions you have with your health care provider. Document Revised: 08/31/2019 Document Reviewed: 08/31/2019 Elsevier Patient Education  2024 [employer].

## 2023-04-14 LAB — CBC WITH DIFFERENTIAL/PLATELET
Absolute Lymphocytes: 3119 {cells}/uL (ref 850–3900)
Absolute Monocytes: 539 {cells}/uL (ref 200–950)
Basophils Absolute: 39 {cells}/uL (ref 0–200)
Basophils Relative: 0.5 %
Eosinophils Absolute: 177 {cells}/uL (ref 15–500)
Eosinophils Relative: 2.3 %
HCT: 41.5 % (ref 35.0–45.0)
Hemoglobin: 13 g/dL (ref 11.7–15.5)
MCH: 26.1 pg — ABNORMAL LOW (ref 27.0–33.0)
MCHC: 31.3 g/dL — ABNORMAL LOW (ref 32.0–36.0)
MCV: 83.3 fL (ref 80.0–100.0)
MPV: 11 fL (ref 7.5–12.5)
Monocytes Relative: 7 %
Neutro Abs: 3827 {cells}/uL (ref 1500–7800)
Neutrophils Relative %: 49.7 %
Platelets: 244 10*3/uL (ref 140–400)
RBC: 4.98 10*6/uL (ref 3.80–5.10)
RDW: 13.4 % (ref 11.0–15.0)
Total Lymphocyte: 40.5 %
WBC: 7.7 10*3/uL (ref 3.8–10.8)

## 2023-04-14 LAB — URINALYSIS, ROUTINE W REFLEX MICROSCOPIC
Bacteria, UA: NONE SEEN /[HPF]
Bilirubin Urine: NEGATIVE
Glucose, UA: NEGATIVE
Hgb urine dipstick: NEGATIVE
Hyaline Cast: NONE SEEN /[LPF]
Ketones, ur: NEGATIVE
Nitrite: NEGATIVE
Protein, ur: NEGATIVE
RBC / HPF: NONE SEEN /[HPF] (ref 0–2)
Specific Gravity, Urine: 1.016 (ref 1.001–1.035)
pH: 5.5 (ref 5.0–8.0)

## 2023-04-14 LAB — LIPID PANEL
Cholesterol: 197 mg/dL (ref <200)
HDL: 63 mg/dL (ref >=50)
LDL Cholesterol (Calc): 115 mg/dL — ABNORMAL HIGH
Non-HDL Cholesterol (Calc): 134 mg/dL — ABNORMAL HIGH (ref <130)
Total CHOL/HDL Ratio: 3.1 (calc) (ref <5.0)
Triglycerides: 88 mg/dL (ref <150)

## 2023-04-14 LAB — MICROALBUMIN / CREATININE URINE RATIO
Creatinine, Urine: 99 mg/dL (ref 20–275)
Microalb Creat Ratio: 3 mg/g{creat} (ref <30)
Microalb, Ur: 0.3 mg/dL

## 2023-04-14 LAB — PROLACTIN: Prolactin: 32.9 ng/mL — ABNORMAL HIGH

## 2023-04-14 LAB — COMPLETE METABOLIC PANEL WITH GFR
AG Ratio: 1.2 (calc) (ref 1.0–2.5)
ALT: 16 U/L (ref 6–29)
AST: 14 U/L (ref 10–35)
Albumin: 3.8 g/dL (ref 3.6–5.1)
Alkaline phosphatase (APISO): 110 U/L (ref 37–153)
BUN: 14 mg/dL (ref 7–25)
CO2: 28 mmol/L (ref 20–32)
Calcium: 9.3 mg/dL (ref 8.6–10.4)
Chloride: 107 mmol/L (ref 98–110)
Creat: 0.83 mg/dL (ref 0.50–1.03)
Globulin: 3.2 g/dL (ref 1.9–3.7)
Glucose, Bld: 94 mg/dL (ref 65–99)
Potassium: 4.1 mmol/L (ref 3.5–5.3)
Sodium: 141 mmol/L (ref 135–146)
Total Bilirubin: 0.3 mg/dL (ref 0.2–1.2)
Total Protein: 7 g/dL (ref 6.1–8.1)
eGFR: 85 mL/min/{1.73_m2} (ref >=60)

## 2023-04-14 LAB — MAGNESIUM: Magnesium: 2 mg/dL (ref 1.5–2.5)

## 2023-04-14 LAB — VITAMIN D 25 HYDROXY (VIT D DEFICIENCY, FRACTURES): Vit D, 25-Hydroxy: 25 ng/mL — ABNORMAL LOW (ref 30–100)

## 2023-04-14 LAB — HEMOGLOBIN A1C W/OUT EAG: Hgb A1c MFr Bld: 5.9 %{Hb} — ABNORMAL HIGH (ref <5.7)

## 2023-04-14 LAB — MICROSCOPIC MESSAGE

## 2023-04-14 LAB — TSH: TSH: 0.56 m[IU]/L

## 2023-04-19 ENCOUNTER — Telehealth: Payer: Self-pay

## 2023-04-19 NOTE — Telephone Encounter (Signed)
Prior auth completed and submitted. Approved.

## 2023-04-22 ENCOUNTER — Encounter: Payer: Self-pay | Admitting: Nurse Practitioner

## 2023-04-22 DIAGNOSIS — E782 Mixed hyperlipidemia: Secondary | ICD-10-CM

## 2023-04-23 MED ORDER — ROSUVASTATIN CALCIUM 5 MG PO TABS: 5.0000 mg | ORAL_TABLET | Freq: Every day | ORAL | 11 refills | Status: DC

## 2023-04-23 MED ORDER — WEGOVY 0.5 MG/0.5ML ~~LOC~~ SOAJ: 0.5000 mg | SUBCUTANEOUS | 2 refills | Status: DC

## 2023-05-07 ENCOUNTER — Other Ambulatory Visit: Payer: Self-pay | Admitting: Nurse Practitioner

## 2023-05-07 MED ORDER — SAXENDA 18 MG/3ML ~~LOC~~ SOPN: 0.6000 mg | PEN_INJECTOR | Freq: Every day | SUBCUTANEOUS | 5 refills | Status: DC

## 2023-05-07 NOTE — Telephone Encounter (Signed)
 Did you get a prior auth for the Saxenda?

## 2023-05-07 NOTE — Telephone Encounter (Signed)
 Never mind Pam I sent in the prescription. Please call and ask her questions about Albuterol

## 2023-10-18 ENCOUNTER — Ambulatory Visit: Payer: Self-pay | Admitting: Nurse Practitioner

## 2023-11-28 ENCOUNTER — Other Ambulatory Visit: Payer: Self-pay

## 2023-11-28 ENCOUNTER — Encounter (HOSPITAL_COMMUNITY): Payer: Self-pay | Admitting: Emergency Medicine

## 2023-11-28 ENCOUNTER — Emergency Department (HOSPITAL_COMMUNITY)
Admission: EM | Admit: 2023-11-28 | Discharge: 2023-11-29 | Disposition: A | Discharge status: Home / Self Care | Attending: Emergency Medicine | Admitting: Emergency Medicine

## 2023-11-28 DIAGNOSIS — L500 Allergic urticaria: Secondary | ICD-10-CM | POA: Diagnosis not present

## 2023-11-28 DIAGNOSIS — Z7951 Long term (current) use of inhaled steroids: Secondary | ICD-10-CM | POA: Diagnosis not present

## 2023-11-28 DIAGNOSIS — J45909 Unspecified asthma, uncomplicated: Secondary | ICD-10-CM | POA: Diagnosis not present

## 2023-11-28 DIAGNOSIS — T7840XA Allergy, unspecified, initial encounter: Secondary | ICD-10-CM

## 2023-11-28 DIAGNOSIS — Z91010 Allergy to peanuts: Secondary | ICD-10-CM | POA: Insufficient documentation

## 2023-11-28 DIAGNOSIS — R21 Rash and other nonspecific skin eruption: Secondary | ICD-10-CM | POA: Diagnosis present

## 2023-11-28 MED ORDER — DIPHENHYDRAMINE HCL 25 MG PO CAPS
25.0000 mg | ORAL_CAPSULE | Freq: Once | ORAL | Status: AC
  Administered 2023-11-29: 25 mg via ORAL
  Filled 2023-11-28: qty 1

## 2023-11-28 MED ORDER — EPINEPHRINE 0.3 MG/0.3ML IJ SOAJ: 0.3000 mg | INTRAMUSCULAR | 0 refills | Status: AC | PRN

## 2023-11-28 MED ORDER — FAMOTIDINE 20 MG PO TABS: 20.0000 mg | ORAL_TABLET | Freq: Every day | ORAL | 0 refills | Status: DC

## 2023-11-28 MED ORDER — DEXAMETHASONE SODIUM PHOSPHATE 10 MG/ML IJ SOLN
10.0000 mg | Freq: Once | INTRAMUSCULAR | Status: AC
  Administered 2023-11-29: 10 mg via INTRAMUSCULAR
  Filled 2023-11-28: qty 1

## 2023-11-28 MED ORDER — PREDNISONE 50 MG PO TABS: ORAL_TABLET | ORAL | 0 refills | Status: DC

## 2023-11-28 MED ORDER — FAMOTIDINE 20 MG PO TABS
20.0000 mg | ORAL_TABLET | Freq: Once | ORAL | Status: AC
  Administered 2023-11-29: 20 mg via ORAL
  Filled 2023-11-28: qty 1

## 2023-11-28 NOTE — ED Triage Notes (Signed)
 Pt reports attempting to eat shrimp tonight despite knowing she is allergic. Pt took 25 mg of benadryl .  Hives noted .  Airway intact.  No angioedema.

## 2023-11-28 NOTE — Discharge Instructions (Signed)
 Avoid shrimp and other foods you are allergic to.  Take the steroids and antihistamines as prescribed.  Use the epinephrine  pen only for severe allergic reaction with difficulty breathing, difficulty swallowing, chest pain, shortness of breath, tongue swelling or lip swelling.  If you see epinephrine  pen, you must come to the hospital afterwards to be evaluated.  Return to the ED with new or worsening symptoms including difficulty breathing, difficulty swallowing, tongue swelling or lip swelling.

## 2023-11-28 NOTE — ED Provider Notes (Signed)
 [place] [facility] DEPARTMENT AT [facility] Provider Note   CSN: [id number] Arrival date & time: 11/28/23  2252     Patient presents with: Allergic Reaction   Wanda Douglas is a 53 y.o. female.  {Add pertinent medical, surgical, social history, OB history to YEP:67052} Patient with a history of asthma hyperlipidemia presents with allergic reaction.  States she ate shrimp tonight around 6 PM despite her known allergy.  States she has not had strep for many years and thought she could get it without difficulty but developed facial swelling and hives and itchiness after eating the shrimp.  She took 25 mg of Benadryl  at home without relief.  Complains swelling to her face and itchy rash to her face and arms.  No chest pain or shortness of breath.  No tongue swelling or lip swelling.  No difficulty breathing or difficulty swallowing.  She has not required epinephrine  in the past.  Has multiple food allergies.  No new medications.  The history is provided by the patient.  Allergic Reaction Presenting symptoms: rash        Prior to Admission medications   Medication Sig Start Date End Date Taking? Authorizing Provider  albuterol  (VENTOLIN  HFA) 108 (90 Base) MCG/ACT inhaler 1 to 2 puffs (5 minutes apart) every 4 hour as needed for Asthma 04/13/23   Wilkinson, Dana E, NP  Azelastine  HCl 137 MCG/SPRAY SOLN PLACE 2 SPRAYS INTO BOTH NOSTRILS 2 (TWO) TIMES DAILY. USE IN EACH NOSTRIL AS DIRECTED 01/03/19   McClanahan, Kyra, NP  baclofen  (LIORESAL ) 10 MG tablet Take 0.5-1 tablets (5-10 mg total) by mouth every 8 (eight) hours as needed for muscle spasms. 08/03/22   Vear, Amy L, PA  cetirizine  (ZYRTEC ) 10 MG tablet Take 1 tablet (10 mg total) by mouth daily. 03/30/22   Wilkinson, Dana E, NP  cholecalciferol (VITAMIN D ) 1000 UNITS tablet Take 1,000 Units by mouth daily.    [provider]  ciclopirox  (PENLAC ) 8 % solution Apply topically at bedtime. Apply over nail and  surrounding skin. Apply daily over previous coat. After seven (7) days, may remove with alcohol and continue cycle. 03/30/22   Wilkinson, Dana E, NP  EPINEPHrine  0.3 mg/0.3 mL IJ SOAJ injection Inject 0.3 mg into the muscle as needed for anaphylaxis. 04/13/23   Wilkinson, Dana E, NP  fluticasone  (FLONASE ) 50 MCG/ACT nasal spray Place 1 spray into both nostrils daily for 3 days. 09/03/18 05/05/22  Soto, Johana, PA-C  hydrOXYzine  (ATARAX ) 25 MG tablet Take 0.5-1 tablets (12.5-25 mg total) by mouth every 8 (eight) hours as needed for itching. 05/14/22   Christopher Savannah, PA-C  Liraglutide  -Weight Management (SAXENDA ) 18 MG/3ML SOPN Inject 0.6 mg into the skin daily. 05/07/23   Wilkinson, Dana E, NP  medroxyPROGESTERone  (PROVERA ) 5 MG tablet Take 1 tablet (5 mg total) by mouth daily for 7 days. 1 tab PO daily x 7 days every 3 months if no natural menstrual period. 05/11/22 05/18/22  Lavoie, Marie-Lyne, MD  Multiple Vitamin (MULTIVITAMIN) tablet Take 1 tablet by mouth daily.    [provider]  rosuvastatin  (CRESTOR ) 5 MG tablet Take 1 tablet (5 mg total) by mouth daily. 04/23/23 04/22/24  Wilkinson, Dana E, NP  SUMAtriptan  (IMITREX ) 50 MG tablet Take 1 tablet (50 mg total) by mouth once as needed for migraine. May repeat in 2 hours if headache persists or recurs. No more than 2 tabs in 24 hours 04/13/23 04/13/24  Wilkinson, Dana E, NP    Allergies: Banana,  Peanut-containing drug products, Shellfish allergy, Chlorhexidine , and Bactrim  [sulfamethoxazole -trimethoprim ]    Review of Systems  Constitutional:  Negative for activity change, appetite change and fever.  HENT:  Negative for congestion and postnasal drip.   Respiratory:  Negative for cough, chest tightness and shortness of breath.   Cardiovascular:  Negative for chest pain.  Gastrointestinal:  Negative for abdominal pain and vomiting.  Genitourinary:  Negative for dysuria and hematuria.  Skin:  Positive for rash.  Neurological:  Negative for  dizziness, weakness and headaches.    all other systems are negative except as noted in the HPI and PMH.   Updated Vital Signs BP (!) 155/98 (BP Location: Right Arm)   Pulse 82   Temp 98.5 F (36.9 C) (Oral)   Resp 18   SpO2 99%   Physical Exam Vitals and nursing note reviewed.  Constitutional:      General: She is not in acute distress.    Appearance: She is well-developed.  HENT:     Head: Normocephalic and atraumatic.     Mouth/Throat:     Pharynx: No oropharyngeal exudate.     Comments: Normal phonation.  No stridor.  No tongue or lip swelling.  Scattered swelling urticaria to face. Eyes:     Conjunctiva/sclera: Conjunctivae normal.     Pupils: Pupils are equal, round, and reactive to light.  Neck:     Comments: No meningismus. Cardiovascular:     Rate and Rhythm: Normal rate and regular rhythm.     Heart sounds: Normal heart sounds. No murmur heard. Pulmonary:     Effort: Pulmonary effort is normal. No respiratory distress.     Breath sounds: Normal breath sounds. No wheezing.  Abdominal:     Palpations: Abdomen is soft.     Tenderness: There is no abdominal tenderness. There is no guarding or rebound.  Musculoskeletal:        General: No tenderness. Normal range of motion.     Cervical back: Normal range of motion and neck supple.  Skin:    General: Skin is warm.     Findings: Rash present.     Comments: Scattered urticaria bilateral forearm  Neurological:     Mental Status: She is alert and oriented to person, place, and time.     Cranial Nerves: No cranial nerve deficit.     Motor: No abnormal muscle tone.     Coordination: Coordination normal.     Comments:  5/5 strength throughout. CN 2-12 intact.Equal grip strength.   Psychiatric:        Behavior: Behavior normal.     (all labs ordered are listed, but only abnormal results are displayed) Labs Reviewed - No data to display  EKG: None  Radiology: No results found.  {Document cardiac monitor,  telemetry assessment procedure when appropriate:32947} Procedures   Medications Ordered in the ED  dexamethasone  (DECADRON ) injection 10 mg (has no administration in time range)  diphenhydrAMINE  (BENADRYL ) capsule 25 mg (has no administration in time range)  famotidine  (PEPCID ) tablet 20 mg (has no administration in time range)      {Click here for ABCD2, HEART and other calculators REFRESH Note before signing:1}                              Medical Decision Making Amount and/or Complexity of Data Reviewed Labs: ordered. Decision-making details documented in ED Course. Radiology: ordered and independent interpretation performed. Decision-making details documented in ED  Course. ECG/medicine tests: ordered and independent interpretation performed. Decision-making details documented in ED Course.  Risk Prescription drug management.   Allergic reaction suspected to shrimp.  Airway intact.  No tongue swelling or lip swelling.  No coughing or wheezing or stridor.  No evidence of anaphylaxis.  Treat with steroids and antihistamines.  {Document critical care time when appropriate  Document review of labs and clinical decision tools ie CHADS2VASC2, etc  Document your independent review of radiology images and any outside records  Document your discussion with family members, caretakers and with consultants  Document social determinants of health affecting pt's care  Document your decision making why or why not admission, treatments were needed:32947:::1}   Final diagnoses:  None    ED Discharge Orders     None

## 2023-12-09 ENCOUNTER — Other Ambulatory Visit: Payer: Self-pay | Admitting: Nurse Practitioner

## 2023-12-09 DIAGNOSIS — Z1231 Encounter for screening mammogram for malignant neoplasm of breast: Secondary | ICD-10-CM

## 2023-12-21 ENCOUNTER — Ambulatory Visit
Admission: RE | Admit: 2023-12-21 | Discharge: 2023-12-21 | Disposition: A | Discharge status: Home / Self Care | Source: Ambulatory Visit | Attending: Nurse Practitioner

## 2023-12-21 DIAGNOSIS — Z1231 Encounter for screening mammogram for malignant neoplasm of breast: Secondary | ICD-10-CM

## 2024-01-11 ENCOUNTER — Ambulatory Visit: Payer: Self-pay | Admitting: Nurse Practitioner

## 2024-01-11 DIAGNOSIS — Z780 Asymptomatic menopausal state: Secondary | ICD-10-CM | POA: Insufficient documentation

## 2024-01-11 DIAGNOSIS — E782 Mixed hyperlipidemia: Secondary | ICD-10-CM | POA: Diagnosis not present

## 2024-01-11 DIAGNOSIS — Z23 Encounter for immunization: Secondary | ICD-10-CM | POA: Diagnosis not present

## 2024-01-11 DIAGNOSIS — E88819 Insulin resistance, unspecified: Secondary | ICD-10-CM

## 2024-01-11 DIAGNOSIS — Z800 Family history of malignant neoplasm of digestive organs: Secondary | ICD-10-CM | POA: Insufficient documentation

## 2024-01-11 DIAGNOSIS — Z803 Family history of malignant neoplasm of breast: Secondary | ICD-10-CM | POA: Insufficient documentation

## 2024-01-11 DIAGNOSIS — Z8052 Family history of malignant neoplasm of bladder: Secondary | ICD-10-CM | POA: Insufficient documentation

## 2024-01-11 MED ORDER — LIRAGLUTIDE -WEIGHT MANAGEMENT 18 MG/3ML ~~LOC~~ SOPN: PEN_INJECTOR | SUBCUTANEOUS | 2 refills | Status: DC

## 2024-01-11 MED ORDER — ROSUVASTATIN CALCIUM 5 MG PO TABS: 5.0000 mg | ORAL_TABLET | Freq: Every day | ORAL | 3 refills | Status: AC

## 2024-01-11 NOTE — Assessment & Plan Note (Addendum)
 Mother at age 53 and PGM-unknown age

## 2024-01-11 NOTE — Assessment & Plan Note (Addendum)
 MGF at age 53 Last colonoscopy 2024

## 2024-01-11 NOTE — Assessment & Plan Note (Signed)
 PGM-unknown age

## 2024-01-11 NOTE — Assessment & Plan Note (Signed)
 Resume crestor  5mg 

## 2024-01-11 NOTE — Patient Instructions (Addendum)
 Thank you for choosing [facility].  Mediterranean Diet A Mediterranean diet is based on the traditions of countries on the [employer]. It focuses on eating more: Fruits and vegetables. Whole grains, beans, nuts, and seeds. Heart-healthy fats. These are fats that are good for your heart. It involves eating less: Dairy. Meat and eggs. Processed foods with added sugar, salt, and fat. This type of diet can help prevent certain conditions. It can also improve outcomes if you have a long-term (chronic) disease, such as kidney or heart disease. What are tips for following this plan? Reading food labels Check packaged foods for: The serving size. For foods such as rice and pasta, the serving size is the amount of cooked product, not dry. The total fat. Avoid foods with saturated fat or trans fat. Added sugars, such as corn syrup. Shopping  Try to have a balanced diet. Buy a variety of foods, such as: Fresh fruits and vegetables. You may be able to get these from local farmers markets. You can also buy them frozen. Grains, beans, nuts, and seeds. Some of these can be bought in bulk. Fresh seafood. Poultry and eggs. Low-fat dairy products. Buy whole ingredients instead of foods that have already been packaged. If you can't get fresh seafood, buy precooked frozen shrimp or canned fish, such as tuna, salmon, or sardines. Stock your pantry so you always have certain foods on hand, such as olive oil, canned tuna, canned tomatoes, rice, pasta, and beans. Cooking Cook foods with extra-virgin olive oil instead of using butter or other vegetable oils. Have meat as a side dish. Have vegetables or grains as your main dish. This means having meat in small portions or adding small amounts of meat to foods like pasta or stew. Use beans or vegetables instead of meat in common dishes like chili or lasagna. Try out different cooking methods. Try roasting, broiling, steaming, and sauting  vegetables. Add frozen vegetables to soups, stews, pasta, or rice. Add nuts or seeds for added healthy fats and plant protein at each meal. You can add these to yogurt, salads, or vegetable dishes. Marinate fish or vegetables using olive oil, lemon juice, garlic, and fresh herbs. Meal planning Plan to eat a vegetarian meal one day each week. Try to work up to two vegetarian meals, if possible. Eat seafood two or more times a week. Have healthy snacks on hand. These may include: Vegetable sticks with hummus. Greek yogurt. Fruit and nut trail mix. Eat balanced meals. These should include: Fruit: 2-3 servings a day. Vegetables: 4-5 servings a day. Low-fat dairy: 2 servings a day. Fish, poultry, or lean meat: 1 serving a day. Beans and legumes: 2 or more servings a week. Nuts and seeds: 1-2 servings a day. Whole grains: 6-8 servings a day. Extra-virgin olive oil: 3-4 servings a day. Limit red meat and sweets to just a few servings a month. Lifestyle  Try to cook and eat meals with your family. Drink enough fluid to keep your pee (urine) pale yellow. Be active every day. This includes: Aerobic exercise, which is exercise that causes your heart to beat faster. Examples include running and swimming. Leisure activities like gardening, walking, or housework. Get 7-8 hours of sleep each night. Drink red wine if your provider says you can. A glass of wine is 5 oz (150 mL). You may be allowed to have: Up to 1 glass a day if you're female and not pregnant. Up to 2 glasses a day if you're female. What  foods should I eat? Fruits Apples. Apricots. Avocado. Berries. Bananas. Cherries. Dates. Figs. Grapes. Lemons. Melon. Oranges. Peaches. Plums. Pomegranate. Vegetables Artichokes. Beets. Broccoli. Cabbage. Carrots. Eggplant. Green beans. Chard. Kale. Spinach. Onions. Leeks. Peas. Squash. Tomatoes. Peppers. Radishes. Grains Whole-grain pasta. Brown rice. Bulgur wheat. Polenta. Couscous. Whole-wheat  bread. [place] Madeira. Meats and other proteins Beans. Almonds. Sunflower seeds. Pine nuts. Peanuts. Cod. Salmon. Scallops. Shrimp. Tuna. Tilapia. Clams. Oysters. Eggs. Chicken or [place] without skin. Dairy Low-fat milk. Cheese. Greek yogurt. Fats and oils Extra-virgin olive oil. Avocado oil. Grapeseed oil. Beverages Water. Red wine. Herbal tea. Sweets and desserts Greek yogurt with honey. Baked apples. Poached pears. Trail mix. Seasonings and condiments Basil. Cilantro. Coriander. Cumin. Mint. Parsley. Sage. Rosemary. Tarragon. Garlic. Oregano. Thyme. Pepper. Balsamic vinegar. Tahini. Hummus. Tomato sauce. Olives. Mushrooms. The items listed above may not be all the foods and drinks you can have. Talk to a dietitian to learn more. What foods should I limit? This is a list of foods that should be eaten rarely. Fruits Fruit canned in syrup. Vegetables Deep-fried potatoes, like [place] fries. Grains Packaged pasta or rice dishes. Cereal with added sugar. Snacks with added sugar. Meats and other proteins Beef. Pork. Lamb. Chicken or [place] with skin. Hot dogs. [place]. Dairy Ice cream. Sour cream. Whole milk. Fats and oils Butter. Canola oil. Vegetable oil. Beef fat (tallow). Lard. Beverages Juice. Sugar-sweetened soft drinks. Beer. Liquor and spirits. Sweets and desserts Cookies. Cakes. Pies. Candy. Seasonings and condiments Mayonnaise. Pre-made sauces and marinades. The items listed above may not be all the foods and drinks you should limit. Talk to a dietitian to learn more. Where to find more information American Heart Association (AHA): heart.org This information is not intended to replace advice given to you by your health care provider. Make sure you discuss any questions you have with your health care provider. Document Revised: 08/02/2022 Document Reviewed: 08/02/2022 Elsevier Patient Education  2024 Elsevier Inc.  How to Increase Your Level of Physical Activity Getting  regular physical activity is important for your overall health and well-being. Most people do not get enough exercise. There are easy ways to increase your level of physical activity, even if you have not been very active in the past or if you are just starting out. What are the benefits of physical activity? Physical activity has many short-term and long-term benefits. Being active on a regular basis can improve your physical and mental health as well as provide other benefits. Physical health benefits Helping you lose weight or maintain a healthy weight. Strengthening your muscles and bones. Reducing your risk of certain long-term (chronic) diseases, including heart disease, cancer, and diabetes. Being able to move around more easily and for longer periods of time without getting tired (increased endurance or stamina). Improving your ability to fight off illness (enhanced immunity). Being able to sleep better. Helping you stay healthy as you get older, including: Helping you stay mobile, or capable of walking and moving around. Preventing accidents, such as falls. Increasing life expectancy. Mental health benefits Boosting your mood and improving your self-esteem. Lowering your chance of having mental health problems, such as depression or anxiety. Helping you feel good about your body. Other benefits Finding new sources of fun and enjoyment. Meeting new people who share a common interest. Before you begin If you have a chronic illness or have not been active for a while, check with your health care provider about how to get started. Ask your health care provider what activities are  safe for you. Start out slowly. Walking or doing some simple chair exercises is a good place to start, especially if you have not been active before or for a long time. Set goals that you can work toward. Ask your health care provider how much exercise is best for you. In general, most adults should: Do  moderate-intensity exercise for at least 150 minutes each week (30 minutes on most days of the week) or vigorous exercise for at least 75 minutes each week, or a combination of these. Moderate-intensity exercise can include walking at a quick pace, biking, yoga, water aerobics, or gardening. Vigorous exercise involves activities that take more effort, such as jogging or running, playing sports, swimming laps, or jumping rope. Do strength exercises on at least 2 days each week. This can include weight lifting, body weight exercises, and resistance-band exercises. How to be more physically active Make a plan  Try to find activities that you enjoy. You are more likely to commit to an exercise routine if it does not feel like a chore. If you have bone or joint problems, choose low-impact exercises, like walking or swimming. Use these tips for being successful with an exercise plan: Find a workout partner for accountability. Join a group or class, such as an aerobics class, cycling class, or sports team. Make family time active. Go for a walk, bike, or swim. Include a variety of exercises each week. Consider using a fitness tracker, such as a mobile phone app or a device worn like a watch, that will count the number of steps you take each day. Many people strive to reach 10,000 steps a day. Find ways to be active in your daily routines Besides your formal exercise plans, you can find ways to do physical activity during your daily routines, such as: Walking or biking to work or to the store. Taking the stairs instead of the elevator. Parking farther away from the door at work or at the store. Planning walking meetings. Walking around while you are on the phone. Where to find more information Centers for Disease Control and Prevention: [employer].pt President's Council on Fitness, Sports & Nutrition: www.fitness.gov ChooseMyPlate: http://www.harvey.com/ Contact a health care provider  if: You have headaches, muscle aches, or joint pain that is concerning. You feel dizzy or light-headed while exercising. You faint. You feel your heart skipping, racing, or fluttering. You have chest pain while exercising. Summary Exercise benefits your mind and body at any age, even if you are just starting out. If you have a chronic illness or have not been active for a while, check with your health care provider before increasing your physical activity. Choose activities that are safe and enjoyable for you. Ask your health care provider what activities are safe for you. Start slowly. Tell your health care provider if you have problems as you start to increase your activity level. This information is not intended to replace advice given to you by your health care provider. Make sure you discuss any questions you have with your health care provider. Document Revised: 08/16/2020 Document Reviewed: 08/16/2020 Elsevier Patient Education  2024 [employer].

## 2024-01-11 NOTE — Assessment & Plan Note (Addendum)
 Unable to phentermine -palpitation. Did not start saxenda  due to loss of pcp. Unable to afford wegovy  or zepbound No previous weight loss program Exercise: walking 87999 to 13000 steps daily Struggles with diet due to work struggle. Agreed to nutritionist referral No Fhx of thyroid  cancer, no hx of gallstones or acute pancreatitis or constipation Wt Readings from Last 3 Encounters:  01/11/24 266 lb 3.2 oz (120.7 kg)  04/13/23 263 lb 9.6 oz (119.6 kg)  05/21/22 260 lb (117.9 kg)    Start saxenda  0.6 to 1.2mg  daily Entered referral to nutritionist. Encouraged to add weight training exercise 2-3x/week

## 2024-01-11 NOTE — Progress Notes (Signed)
 Established Patient Visit  Patient: Wanda Douglas   DOB: 1970-07-18   53 y.o. Female  MRN: [id number] Visit Date: 01/11/2024  Subjective:    Chief Complaint  Patient presents with   Menopause    Discuss menopause, weight gain and wegovy , no cholesterol medication for 3 months, diagnostic mammogram screening and any needed vaccines    HPI Transfer from Dr. Pryor Morbid obesity [facility]) Unable to phentermine -palpitation. Did not start saxenda  due to loss of pcp. Unable to afford wegovy  or zepbound No previous weight loss program Exercise: walking 87999 to 13000 steps daily Struggles with diet due to work struggle. Agreed to nutritionist referral No Fhx of thyroid  cancer, no hx of gallstones or acute pancreatitis or constipation Wt Readings from Last 3 Encounters:  01/11/24 266 lb 3.2 oz (120.7 kg)  04/13/23 263 lb 9.6 oz (119.6 kg)  05/21/22 260 lb (117.9 kg)    Start saxenda  0.6 to 1.2mg  daily Entered referral to nutritionist. Encouraged to add weight training exercise 2-3x/week  Postmenopause LMP 12/2022 Associated with intermittent hotflashes, intermittent night sweats, thinning hair, mood swings. She describes symptoms as mild Denies need for any medication  Hyperlipidemia Resume crestor  5mg   Family history of breast cancer in female PGM-unknown age  Family history of bladder cancer Mother at age 31 and PGM-unknown age  Family history of colon cancer MGF at age 75 Last colonoscopy 2024  Reviewed medical, surgical, and social history today  Medications: Outpatient Medications Prior to Visit  Medication Sig Note   albuterol  (VENTOLIN  HFA) 108 (90 Base) MCG/ACT inhaler 1 to 2 puffs (5 minutes apart) every 4 hour as needed for Asthma    cetirizine  (ZYRTEC ) 10 MG tablet Take 1 tablet (10 mg total) by mouth daily.    cholecalciferol (VITAMIN D ) 1000 UNITS tablet Take 1,000 Units by mouth daily.    EPINEPHrine  0.3 mg/0.3 mL IJ SOAJ injection  Inject 0.3 mg into the muscle as needed for anaphylaxis.    fluticasone  (FLONASE ) 50 MCG/ACT nasal spray Place 1 spray into both nostrils daily for 3 days.    Multiple Vitamin (MULTIVITAMIN) tablet Take 1 tablet by mouth daily.    SUMAtriptan  (IMITREX ) 50 MG tablet Take 1 tablet (50 mg total) by mouth once as needed for migraine. May repeat in 2 hours if headache persists or recurs. No more than 2 tabs in 24 hours    medroxyPROGESTERone  (PROVERA ) 5 MG tablet Take 1 tablet (5 mg total) by mouth daily for 7 days. 1 tab PO daily x 7 days every 3 months if no natural menstrual period.    [DISCONTINUED] Azelastine  HCl 137 MCG/SPRAY SOLN PLACE 2 SPRAYS INTO BOTH NOSTRILS 2 (TWO) TIMES DAILY. USE IN EACH NOSTRIL AS DIRECTED (Patient not taking: Reported on 01/11/2024)    [DISCONTINUED] baclofen  (LIORESAL ) 10 MG tablet Take 0.5-1 tablets (5-10 mg total) by mouth every 8 (eight) hours as needed for muscle spasms. (Patient not taking: Reported on 01/11/2024)    [DISCONTINUED] ciclopirox  (PENLAC ) 8 % solution Apply topically at bedtime. Apply over nail and surrounding skin. Apply daily over previous coat. After seven (7) days, may remove with alcohol and continue cycle. (Patient not taking: Reported on 01/11/2024)    [DISCONTINUED] famotidine  (PEPCID ) 20 MG tablet Take 1 tablet (20 mg total) by mouth daily. (Patient not taking: Reported on 01/11/2024)    [DISCONTINUED] hydrOXYzine  (ATARAX ) 25 MG tablet Take 0.5-1 tablets (12.5-25 mg total) by mouth  every 8 (eight) hours as needed for itching. (Patient not taking: Reported on 01/11/2024)    [DISCONTINUED] Liraglutide  -Weight Management (SAXENDA ) 18 MG/3ML SOPN Inject 0.6 mg into the skin daily. (Patient not taking: Reported on 01/11/2024)    [DISCONTINUED] predniSONE  (DELTASONE ) 50 MG tablet 1 tablet PO daily (Patient not taking: Reported on 01/11/2024)    [DISCONTINUED] rosuvastatin  (CRESTOR ) 5 MG tablet Take 1 tablet (5 mg total) by mouth daily. (Patient not taking: Reported on  01/11/2024) 01/11/2024: Ran out 3 months ago    No facility-administered medications prior to visit.   Reviewed past medical and social history.   ROS per HPI above      Objective:  BP 118/78 (BP Location: Left Arm, Patient Position: Sitting, Cuff Size: Large)   Pulse 80   Temp 98.4 F (36.9 C) (Oral)   Ht 5' 4 (1.626 m)   Wt 266 lb 3.2 oz (120.7 kg)   LMP 01/13/2022 (Approximate) Comment: Pt states not having sex pt states she is a virgin  SpO2 97%   BMI 45.69 kg/m      Physical Exam Vitals and nursing note reviewed.  Constitutional:      Appearance: She is obese.  Cardiovascular:     Rate and Rhythm: Normal rate and regular rhythm.     Pulses: Normal pulses.     Heart sounds: Normal heart sounds.  Pulmonary:     Effort: Pulmonary effort is normal.     Breath sounds: Normal breath sounds.  Musculoskeletal:     Right lower leg: No edema.     Left lower leg: No edema.  Neurological:     Mental Status: She is alert and oriented to person, place, and time.     No results found for any visits on 01/11/24.    Assessment & Plan:    Problem List Items Addressed This Visit     Family history of bladder cancer   Mother at age 72 and PGM-unknown age      Relevant Orders   Ambulatory referral to Genetics   Family history of breast cancer in female   PGM-unknown age      Relevant Orders   Ambulatory referral to Genetics   Family history of colon cancer   MGF at age 60 Last colonoscopy 2024      Relevant Orders   Ambulatory referral to Genetics   Hyperlipidemia   Resume crestor  5mg       Relevant Medications   rosuvastatin  (CRESTOR ) 5 MG tablet   Liraglutide  -Weight Management (SAXENDA ) 18 MG/3ML SOPN   Other Relevant Orders   Amb ref to Medical Nutrition Therapy-MNT   Insulin  resistance   Relevant Medications   Liraglutide  -Weight Management (SAXENDA ) 18 MG/3ML SOPN   Other Relevant Orders   Amb ref to Medical Nutrition Therapy-MNT   Morbid obesity  (HCC) - Primary   Unable to phentermine -palpitation. Did not start saxenda  due to loss of pcp. Unable to afford wegovy  or zepbound No previous weight loss program Exercise: walking 87999 to 13000 steps daily Struggles with diet due to work struggle. Agreed to nutritionist referral No Fhx of thyroid  cancer, no hx of gallstones or acute pancreatitis or constipation Wt Readings from Last 3 Encounters:  01/11/24 266 lb 3.2 oz (120.7 kg)  04/13/23 263 lb 9.6 oz (119.6 kg)  05/21/22 260 lb (117.9 kg)    Start saxenda  0.6 to 1.2mg  daily Entered referral to nutritionist. Encouraged to add weight training exercise 2-3x/week      Relevant  Medications   Liraglutide  -Weight Management (SAXENDA ) 18 MG/3ML SOPN   Other Relevant Orders   Amb ref to Medical Nutrition Therapy-MNT   Postmenopause   LMP 12/2022 Associated with intermittent hotflashes, intermittent night sweats, thinning hair, mood swings. She describes symptoms as mild Denies need for any medication      Other Visit Diagnoses       Immunization due       Relevant Orders   Zoster Recombinant (Shingrix  ) (Completed)   Flu vaccine trivalent PF, 6mos and older(Flulaval,Afluria,Fluarix,Fluzone ) (Completed)      Return in about 4 weeks (around 02/08/2024) for Weight management, prediabetes, hyperlipidemia (fasting).     Roselie Mood, NP

## 2024-01-11 NOTE — Assessment & Plan Note (Signed)
 LMP 12/2022 Associated with intermittent hotflashes, intermittent night sweats, thinning hair, mood swings. She describes symptoms as mild Denies need for any medication

## 2024-01-26 ENCOUNTER — Ambulatory Visit

## 2024-01-26 ENCOUNTER — Telehealth: Payer: Self-pay

## 2024-01-26 NOTE — Telephone Encounter (Signed)
 LVM for pt to call the office if she would like to cancel the NV scheduled for today and RS to 02/10/24, the same day her 2nd Shingrix  vaccine is due. Not required, just an option to save the pt an extra trip to the office is she prefers.

## 2024-02-01 ENCOUNTER — Other Ambulatory Visit: Payer: Self-pay

## 2024-02-01 ENCOUNTER — Ambulatory Visit
Admission: EM | Admit: 2024-02-01 | Discharge: 2024-02-01 | Disposition: A | Discharge status: Home / Self Care | Attending: Physician Assistant | Admitting: Physician Assistant

## 2024-02-01 DIAGNOSIS — M25512 Pain in left shoulder: Secondary | ICD-10-CM

## 2024-02-01 DIAGNOSIS — M545 Low back pain, unspecified: Secondary | ICD-10-CM

## 2024-02-01 DIAGNOSIS — W19XXXA Unspecified fall, initial encounter: Secondary | ICD-10-CM | POA: Diagnosis not present

## 2024-02-01 MED ORDER — METHOCARBAMOL 500 MG PO TABS: 500.0000 mg | ORAL_TABLET | Freq: Two times a day (BID) | ORAL | 0 refills | Status: DC

## 2024-02-01 NOTE — Discharge Instructions (Addendum)
 VISIT SUMMARY:  You came in today due to left-sided body pain and stiffness following a fall at work. You experienced pain in your left arm, shoulder, hip, knee, and lower back. The pain has been improving but you still feel stiffness, especially in the mornings. You have been managing the pain with ibuprofen , which has helped somewhat.  YOUR PLAN:  -LEFT-SIDED MUSCULOSKELETAL PAIN: This means you have pain in the muscles and bones on the left side of your body due to your fall. The pain is improving but you still have some stiffness. You should continue to stay active with light activities and stretches to prevent stiffness. You can take Tylenol  and ibuprofen  as needed for pain, and Robaxin for muscle spasms, but be aware it may cause drowsiness. Using massage and warm compresses on the affected areas can also help. You are cleared to return to work but should take it easy.  INSTRUCTIONS:  Please follow the plan for managing your pain and stiffness. If your symptoms do not improve or if they worsen, please schedule a follow-up appointment.

## 2024-02-01 NOTE — ED Provider Notes (Signed)
 [facility]    CSN: [id number] Arrival date & time: 02/01/24  1110      History   Chief Complaint Chief Complaint  Patient presents with   Shoulder Injury   Fall    HPI Wanda Douglas is a 53 y.o. female.  has a past medical history of Allergy, Anemia, Arthritis, Asthma, Bloody discharge from left nipple (07/2017), Cellulitis, COVID-19 (04/30/2020) (05/06/2020), History of COVID-19 (04/29/2020), Hyperlipidemia, Irregular menstrual cycle, Left breast mass (07/2017), and Pituitary adenoma (HCC).   HPI  Discussed the use of AI scribe software for clinical note transcription with the patient, who gave verbal consent to proceed.  The patient presents with left-sided body pain and stiffness following a fall at work.  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 She also has pain in her lower back, described as cramps or spasms.  Initially, she managed the pain with ibuprofen  over the weekend, which provided some relief. However, she continues to experience stiffness, particularly in the mornings, and describes the pain as progressively improving but still present. She describes the pain as present and enough to notice, but not extreme.  She reports occasional numbness and a 'dead weight' sensation in her left hand upon waking, which she attributes to stiffness. No difficulty walking, although she experiences initial stiffness that improves with movement. No numbness in her inner thighs or incontinence issues.  There was initial swelling in the affected areas, which has since subsided, although she still feels inflammation at times. She has not used warm compresses or other pain management techniques aside from ibuprofen .  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 She has been out of work for two days.    Past Medical  History:  Diagnosis Date   Allergy    SEASONAL   Anemia    Arthritis    ANKLE,LEFT   Asthma    Bloody discharge from left nipple 07/2017   Cellulitis    COVID-19 (04/30/2020) 05/06/2020   History of COVID-19 04/29/2020   Hyperlipidemia    ELEVATED IN NOVEMBER,2023   Irregular menstrual cycle    Left breast mass 07/2017   Pituitary adenoma [facility])     Patient Active Problem List   Diagnosis Date Noted   Postmenopause 01/11/2024   Family history of breast cancer in female 01/11/2024   Family history of bladder cancer 01/11/2024   Family history of colon cancer 01/11/2024   Insulin  resistance 03/05/2020   Vitamin D  deficiency 04/20/2018   Hyperlipidemia 10/18/2017   Morbid obesity (HCC) 10/18/2017   Intraductal papilloma 06/16/2017   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   Multiple food allergies 12/19/2013   Seasonal allergies 12/19/2013   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    Past Surgical History:  Procedure Laterality Date   BREAST DUCTAL SYSTEM EXCISION Left 07/26/2017   Procedure: EXCISION DUCTAL SYSTEM BREAST;  Surgeon: Ebbie Cough, MD;  Location: [facility];  Service: General;  Laterality: Left;   BREAST DUCTAL SYSTEM EXCISION Bilateral 05/23/2020   Procedure: BIL BREAST CENTRAL DUCT EXCISION;  Surgeon: Ebbie Cough, MD;  Location: [facility];  Service: General;  Laterality: Bilateral;   BREAST SURGERY     RADIOACTIVE SEED GUIDED EXCISIONAL BREAST BIOPSY Left 07/26/2017   Procedure: LEFT RADIOACTIVE SEED GUIDED EXCISIONAL BREAST BIOPSY ERAS PATHWAY;  Surgeon: Ebbie Cough, MD;  Location: [facility];  Service: General;  Laterality: Left;   RADIOACTIVE SEED GUIDED EXCISIONAL BREAST BIOPSY Right 06/16/2018   Procedure: RADIOACTIVE SEED GUIDED EXCISIONAL RIGHT BREAST BIOPSY;  Surgeon: Ebbie Cough, MD;  Location: [facility];  Service: General;  Laterality: Right;   right  breast biopsy Right 2019   With axillary lymph node biopsy   TONSILLECTOMY      OB History     Gravida  0   Para  0   Term  0   Preterm  0   AB  0   Living  0      SAB  0   IAB  0   Ectopic  0   Multiple  0   Live Births  0        Obstetric Comments  virgin          Home Medications    Prior to Admission medications   Medication Sig Start Date End Date Taking? Authorizing Provider  methocarbamol (ROBAXIN) 500 MG tablet Take 1 tablet (500 mg total) by mouth 2 (two) times daily. 02/01/24  Yes Devron Cohick E, PA-C  albuterol  (VENTOLIN  HFA) 108 (90 Base) MCG/ACT inhaler 1 to 2 puffs (5 minutes apart) every 4 hour as needed for Asthma 04/13/23   Wilkinson, Dana E, NP  cetirizine  (ZYRTEC ) 10 MG tablet Take 1 tablet (10 mg total) by mouth daily. 03/30/22   Wilkinson, Dana E, NP  cholecalciferol (VITAMIN D ) 1000 UNITS tablet Take 1,000 Units by mouth daily.    [provider]  EPINEPHrine  0.3 mg/0.3 mL IJ SOAJ injection Inject 0.3 mg into the muscle as needed for anaphylaxis. 11/28/23   Rancour, Garnette, MD  fluticasone  (FLONASE ) 50 MCG/ACT nasal spray Place 1 spray into both nostrils daily for 3 days. 09/03/18 01/11/24  Soto, Johana, PA-C  Liraglutide  -Weight Management (SAXENDA ) 18 MG/3ML SOPN Inject 0.6mg  daily x 1week, then 1.2mg  daily till next appointment in 4weeks 01/11/24   Nche, Roselie Rockford, NP  medroxyPROGESTERone  (PROVERA ) 5 MG tablet Take 1 tablet (5 mg total) by mouth daily for 7 days. 1 tab PO daily x 7 days every 3 months if no natural menstrual period. 05/11/22 05/18/22  Lavoie, Marie-Lyne, MD  Multiple Vitamin (MULTIVITAMIN) tablet Take 1 tablet by mouth daily.    [provider]  rosuvastatin  (CRESTOR ) 5 MG tablet Take 1 tablet (5 mg total) by mouth daily. 01/11/24 01/10/25  Nche, Roselie Rockford, NP  SUMAtriptan  (IMITREX ) 50 MG tablet Take 1 tablet (50 mg total) by mouth once as needed for migraine. May repeat in 2 hours if headache persists or  recurs. No more than 2 tabs in 24 hours 04/13/23 04/13/24  Jude Lonell BRAVO, NP    Family History Family History  Problem Relation Age of Onset   Hypertension Mother    Spina bifida Mother    Drug abuse Mother    Bladder Cancer Mother        smoker   Myopathy Father        Inclusion myolitis   Colon cancer Maternal Aunt        RARE FORM ASSOSIATED WITH HIV   Lung cancer Maternal Grandmother        smoker   Rheum arthritis Maternal Grandmother    Colon cancer Maternal Grandfather    Hypertension Maternal Grandfather    Diabetes Maternal Grandfather    Breast cancer Paternal Grandmother    Diabetes Paternal Grandmother    Bladder Cancer Paternal  Grandmother    Colon polyps Neg Hx    Crohn's disease Neg Hx    Esophageal cancer Neg Hx    Rectal cancer Neg Hx    Stomach cancer Neg Hx    Ulcerative colitis Neg Hx     Social History Social History   Tobacco Use   Smoking status: Never    Passive exposure: Current (FAMILY MEMBERS SMOKE)   Smokeless tobacco: Never  Vaping Use   Vaping status: Never Used  Substance Use Topics   Alcohol use: Yes    Comment: OCCASSIONALLY   Drug use: No     Allergies   Banana, Peanut-containing drug products, Shellfish allergy, Chlorhexidine , and Bactrim  [sulfamethoxazole -trimethoprim ]   Review of Systems Review of Systems  Musculoskeletal:  Positive for arthralgias, back pain and myalgias. Negative for gait problem.  Neurological:  Negative for tremors, weakness and numbness.     Physical Exam Triage Vital Signs ED Triage Vitals  Encounter Vitals Group     BP 02/01/24 1130 113/80     Girls Systolic BP Percentile --      Girls Diastolic BP Percentile --      Boys Systolic BP Percentile --      Boys Diastolic BP Percentile --      Pulse Rate 02/01/24 1130 79     Resp 02/01/24 1130 20     Temp 02/01/24 1130 99.2 F (37.3 C)     Temp Source 02/01/24 1130 Oral     SpO2 02/01/24 1130 96 %     Weight 02/01/24 1130 257 lb  (116.6 kg)     Height 02/01/24 1130 5' 3 (1.6 m)     Head Circumference --      Peak Flow --      Pain Score 02/01/24 1147 5     Pain Loc --      Pain Education --      Exclude from Growth Chart --    No data found.  Updated Vital Signs BP 113/80 (BP Location: Right Arm)   Pulse 79   Temp 99.2 F (37.3 C) (Oral)   Resp 20   Ht 5' 3 (1.6 m)   Wt 257 lb (116.6 kg)   LMP 01/13/2022 (Approximate) Comment: Pt states not having sex pt states she is a virgin  SpO2 96%   BMI 45.53 kg/m   Visual Acuity Right Eye Distance:   Left Eye Distance:   Bilateral Distance:    Right Eye Near:   Left Eye Near:    Bilateral Near:     Physical Exam Vitals reviewed.  Constitutional:      General: She is awake.     Appearance: Normal appearance. She is well-developed and well-groomed.  HENT:     Head: Normocephalic and atraumatic.  Eyes:     General: Lids are normal. Gaze aligned appropriately.     Extraocular Movements: Extraocular movements intact.     Conjunctiva/sclera: Conjunctivae normal.  Pulmonary:     Effort: Pulmonary effort is normal.  Musculoskeletal:     Left shoulder: Tenderness (along anterior aspect of left shoulder) present. No swelling, deformity or bony tenderness. Normal strength.     Right hip: Normal. No tenderness. Normal range of motion.     Left hip: Normal. No tenderness. Normal range of motion.     Left knee: No swelling or deformity. Normal range of motion. Tenderness (anterior aspect of the knee) present. No LCL laxity, MCL laxity, ACL laxity or PCL laxity.Normal meniscus.  Instability Tests: Anterior drawer test negative. Posterior drawer test negative. Medial McMurray test negative and lateral McMurray test negative.     Comments: ROM findings Shoulder: ROM is intact with regards to flexion, extension, abduction, adduction, internal and external rotation. Thoracic: Lateral flexion and lateral rotation are intact and symmetrical Lumbar: Extension,  flexion are intact. Mild pain on extension but intact and flexion provides some relief of discomfort Hips: Extension, flexion, abduction, adduction are symmetrical and intact Knees: Knee range of motion is intact and symmetrical.  Flexion and extension are normal.  No crepitus appreciated on exam.  MCL, LCL are normal without observed laxity.  Anterior and posterior drawer normal bilaterally.  No effusions or swelling noted to palpation.   Neurological:     Mental Status: She is alert and oriented to person, place, and time.  Psychiatric:        Attention and Perception: Attention and perception normal.        Mood and Affect: Mood and affect normal.        Speech: Speech normal.        Behavior: Behavior normal. Behavior is cooperative.      UC Treatments / Results  Labs (all labs ordered are listed, but only abnormal results are displayed) Labs Reviewed - No data to display  EKG   Radiology No results found.  Procedures Procedures (including critical care time)  Medications Ordered in UC Medications - No data to display  Initial Impression / Assessment and Plan / UC Course  I have reviewed the triage vital signs and the nursing notes.  Pertinent labs & imaging results that were available during my care of the patient were reviewed by me and considered in my medical decision making (see chart for details).   CMP from 1210/2024 reviewed to assist with management plan- eGFR is >60   Final Clinical Impressions(s) / UC Diagnoses   Final diagnoses:  Acute bilateral low back pain without sciatica  Acute pain of left shoulder  Fall, initial encounter   Left-sided musculoskeletal pain after fall at work involving left arm, shoulder, hip, knee, and low back Acute musculoskeletal pain on the left side involving the arm, shoulder, hip, knee, and low back following a fall at work. Pain is improving but stiffness persists, especially in the morning. No significant swelling or  bruising currently. Full range of motion in the shoulder and knee with no instability. No obvious signs of rotator cuff injury or ligament damage. Pain is not severe and is managed with ibuprofen . - Advise active rest with stretches to prevent stiffness. - Recommend light activity around the house and moving several times an hour. - Recommend Tylenol  and ibuprofen  as needed for pain. - Prescribe Robaxin for muscle spasms, cautioning about potential drowsiness. - Advise use of massage and warm compresses to affected areas. - Clear to return to work with instructions to take it easy.     Discharge Instructions      VISIT SUMMARY:  You came in today due to left-sided body pain and stiffness following a fall at work. You experienced pain in your left arm, shoulder, hip, knee, and lower back. The pain has been improving but you still feel stiffness, especially in the mornings. You have been managing the pain with ibuprofen , which has helped somewhat.  YOUR PLAN:  -LEFT-SIDED MUSCULOSKELETAL PAIN: This means you have pain in the muscles and bones on the left side of your body due to your fall. The pain is improving but you  still have some stiffness. You should continue to stay active with light activities and stretches to prevent stiffness. You can take Tylenol  and ibuprofen  as needed for pain, and Robaxin for muscle spasms, but be aware it may cause drowsiness. Using massage and warm compresses on the affected areas can also help. You are cleared to return to work but should take it easy.  INSTRUCTIONS:  Please follow the plan for managing your pain and stiffness. If your symptoms do not improve or if they worsen, please schedule a follow-up appointment.     ED Prescriptions     Medication Sig Dispense Auth. Provider   methocarbamol (ROBAXIN) 500 MG tablet Take 1 tablet (500 mg total) by mouth 2 (two) times daily. 20 tablet Keelan Pomerleau E, PA-C      PDMP not reviewed this encounter.    Allyse Fregeau, Rocky BRAVO, PA-C 02/01/24 1245

## 2024-02-01 NOTE — ED Triage Notes (Signed)
 Pt presents with a chief complaint of fall and left shoulder injury. 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 Lost balance and fell on left side/shoulder. Currently rates overall pain a 5/10. Feels very sore. OTC Ibuprofen  taken at home with improvement/relief in pain. Left shoulder has the most pain. Pain is radiating down the left side into side and left leg.

## 2024-02-09 ENCOUNTER — Encounter: Payer: Self-pay | Admitting: Nurse Practitioner

## 2024-02-09 ENCOUNTER — Ambulatory Visit: Admitting: Nurse Practitioner

## 2024-02-09 DIAGNOSIS — M545 Low back pain, unspecified: Secondary | ICD-10-CM | POA: Diagnosis not present

## 2024-02-09 NOTE — Patient Instructions (Addendum)
 Genetic counselor: [phone] Schedule appointment with the nutritionist. Schedule  a 98month f/up appointment once your start Saxenda   Continue ibuprofen  and robaxin as needed for pain.  Acute Back Pain, Adult Acute back pain is sudden and usually short-lived. It is often caused by an injury to the muscles and tissues in the back. The injury may result from: A muscle, tendon, or ligament getting overstretched or torn. Ligaments are tissues that connect bones to each other. Lifting something improperly can cause a back strain. Wear and tear (degeneration) of the spinal disks. Spinal disks are circular tissue that provide cushioning between the bones of the spine (vertebrae). Twisting motions, such as while playing sports or doing yard work. A hit to the back. Arthritis. You may have a physical exam, lab tests, and imaging tests to find the cause of your pain. Acute back pain usually goes away with rest and home care. Follow these instructions at home: Managing pain, stiffness, and swelling Take over-the-counter and prescription medicines only as told by your health care provider. Treatment may include medicines for pain and inflammation that are taken by mouth or applied to the skin, or muscle relaxants. Your health care provider may recommend applying ice during the first 24-48 hours after your pain starts. To do this: Put ice in a plastic bag. Place a towel between your skin and the bag. Leave the ice on for 20 minutes, 2-3 times a day. Remove the ice if your skin turns bright red. This is very important. If you cannot feel pain, heat, or cold, you have a greater risk of damage to the area. If directed, apply heat to the affected area as often as told by your health care provider. Use the heat source that your health care provider recommends, such as a moist heat pack or a heating pad. Place a towel between your skin and the heat source. Leave the heat on for 20-30 minutes. Remove the  heat if your skin turns bright red. This is especially important if you are unable to feel pain, heat, or cold. You have a greater risk of getting burned. Activity  Do not stay in bed. Staying in bed for more than 1-2 days can delay your recovery. Sit up and stand up straight. Avoid leaning forward when you sit or hunching over when you stand. If you work at a desk, sit close to it so you do not need to lean over. Keep your chin tucked in. Keep your neck drawn back, and keep your elbows bent at a 90-degree angle (right angle). Sit high and close to the steering wheel when you drive. Add lower back (lumbar) support to your car seat, if needed. Take short walks on even surfaces as soon as you are able. Try to increase the length of time you walk each day. Do not sit, drive, or stand in one place for more than 30 minutes at a time. Sitting or standing for long periods of time can put stress on your back. Do not drive or use heavy machinery while taking prescription pain medicine. Use proper lifting techniques. When you bend and lift, use positions that put less stress on your back: [place] your knees. Keep the load close to your body. Avoid twisting. Exercise regularly as told by your health care provider. Exercising helps your back heal faster and helps prevent back injuries by keeping muscles strong and flexible. Work with a physical therapist to make a safe exercise program, as recommended by your health care provider.  Do any exercises as told by your physical therapist. Lifestyle Maintain a healthy weight. Extra weight puts stress on your back and makes it difficult to have good posture. Avoid activities or situations that make you feel anxious or stressed. Stress and anxiety increase muscle tension and can make back pain worse. Learn ways to manage anxiety and stress, such as through exercise. General instructions Sleep on a firm mattress in a comfortable position. Try lying on your side with your  knees slightly bent. If you lie on your back, put a pillow under your knees. Keep your head and neck in a straight line with your spine (neutral position) when using electronic equipment like smartphones or pads. To do this: Raise your smartphone or pad to look at it instead of bending your head or neck to look down. Put the smartphone or pad at the level of your face while looking at the screen. Follow your treatment plan as told by your health care provider. This may include: Cognitive or behavioral therapy. Acupuncture or massage therapy. Meditation or yoga. Contact a health care provider if: You have pain that is not relieved with rest or medicine. You have increasing pain going down into your legs or buttocks. Your pain does not improve after 2 weeks. You have pain at night. You lose weight without trying. You have a fever or chills. You develop nausea or vomiting. You develop abdominal pain. Get help right away if: You develop new bowel or bladder control problems. You have unusual weakness or numbness in your arms or legs. You feel faint. These symptoms may represent a serious problem that is an emergency. Do not wait to see if the symptoms will go away. Get medical help right away. Call your local emergency services (911 in the [place] Do not drive yourself to the hospital. Summary Acute back pain is sudden and usually short-lived. Use proper lifting techniques. When you bend and lift, use positions that put less stress on your back. Take over-the-counter and prescription medicines only as told by your health care provider, and apply heat or ice as told. This information is not intended to replace advice given to you by your health care provider. Make sure you discuss any questions you have with your health care provider. Document Revised: 07/12/2020 Document Reviewed: 07/12/2020 Elsevier Patient Education  2024 [employer].

## 2024-02-09 NOTE — Assessment & Plan Note (Signed)
 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 She had an urgent care visit on 02/01/2024. Took ibuprofen  2tabs every 6hrs with moderate relief. No weakness, no paresthesia, no limited ROM.  Advised to continue use of NSAID and muscle relaxant prn Alternate between warm and cold compress as needed

## 2024-02-09 NOTE — Assessment & Plan Note (Addendum)
 Did not start saxenda  due to fear of possible side effects. Did not schedule appointment with nutritionist due to conflict with work schedule. Wt Readings from Last 3 Encounters:  02/09/24 269 lb 3.2 oz (122.1 kg)  02/01/24 257 lb (116.6 kg)  01/11/24 266 lb 3.2 oz (120.7 kg)    Advised to schedule 10month f/up appointment after initiating lifestyle modifications and use of saxenda .

## 2024-02-09 NOTE — Progress Notes (Signed)
 Established Patient Visit  Patient: Wanda Douglas   DOB: [birth year]   53 y.o. Female  MRN: [id number] Visit Date: 02/09/2024  Subjective:    Chief Complaint  Patient presents with   Follow-up    4 week follow up weight management- has not started Saxenda  Left side pain from recent fall (6-7/10 pain score)    HPI Morbid obesity (HCC) Did not start saxenda  due to fear of possible side effects. Did not schedule appointment with nutritionist due to conflict with work schedule. Wt Readings from Last 3 Encounters:  02/09/24 269 lb 3.2 oz (122.1 kg)  02/01/24 257 lb (116.6 kg)  01/11/24 266 lb 3.2 oz (120.7 kg)    Advised to schedule 27month f/up appointment after initiating lifestyle modifications and use of saxenda .  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 She had an urgent care visit on 02/01/2024. Took ibuprofen  2tabs every 6hrs with moderate relief. No weakness, no paresthesia, no limited ROM.  Advised to continue use of NSAID and muscle relaxant prn Alternate between warm and cold compress as needed  Reviewed medical, surgical, and social history today  Medications: Outpatient Medications Prior to Visit  Medication Sig Note   albuterol  (VENTOLIN  HFA) 108 (90 Base) MCG/ACT inhaler 1 to 2 puffs (5 minutes apart) every 4 hour as needed for Asthma    cetirizine  (ZYRTEC ) 10 MG tablet Take 1 tablet (10 mg total) by mouth daily.    cholecalciferol (VITAMIN D ) 1000 UNITS tablet Take 1,000 Units by mouth daily.    EPINEPHrine  0.3 mg/0.3 mL IJ SOAJ injection Inject 0.3 mg into the muscle as needed for anaphylaxis.    fluticasone  (FLONASE ) 50 MCG/ACT nasal spray Place 1 spray into both nostrils daily for 3 days.    medroxyPROGESTERone  (PROVERA ) 5 MG tablet Take 1 tablet (5 mg total) by mouth daily for 7 days. 1 tab PO daily x 7 days every 3 months if no natural  menstrual period.    methocarbamol (ROBAXIN) 500 MG tablet Take 1 tablet (500 mg total) by mouth 2 (two) times daily.    Multiple Vitamin (MULTIVITAMIN) tablet Take 1 tablet by mouth daily.    rosuvastatin  (CRESTOR ) 5 MG tablet Take 1 tablet (5 mg total) by mouth daily.    SUMAtriptan  (IMITREX ) 50 MG tablet Take 1 tablet (50 mg total) by mouth once as needed for migraine. May repeat in 2 hours if headache persists or recurs. No more than 2 tabs in 24 hours    Liraglutide  -Weight Management (SAXENDA ) 18 MG/3ML SOPN Inject 0.6mg  daily x 1week, then 1.2mg  daily till next appointment in 4weeks 02/09/2024: Has not started yet    No facility-administered medications prior to visit.   Reviewed past medical and social history.   ROS per HPI above      Objective:  BP 128/76 (BP Location: Right Arm, Patient Position: Sitting, Cuff Size: Large)   Pulse 82   Temp 98.3 F (36.8 C) (Oral)   Ht 5' 3 (1.6 m)   Wt 269 lb 3.2 oz (122.1 kg)   LMP 01/13/2022 (Approximate) Comment: Pt states not having sex pt states she is a virgin  SpO2 95%   BMI 47.69 kg/m      Physical Exam Vitals and nursing note reviewed.  Constitutional:      Appearance: She is obese.  Cardiovascular:     Rate and Rhythm: Normal rate.     Pulses: Normal pulses.  Pulmonary:     Effort: Pulmonary effort is normal.  Chest:     Chest wall: No tenderness or crepitus.  Musculoskeletal:     Right shoulder: Normal.     Left shoulder: Normal.     Right upper arm: Normal.     Left upper arm: Normal.     Cervical back: Normal.     Thoracic back: Normal.     Lumbar back: Tenderness present. No bony tenderness. Normal range of motion. Negative right straight leg raise test and negative left straight leg raise test.     Right hip: Normal.     Left hip: Tenderness present. No deformity, bony tenderness or crepitus. Normal range of motion. Normal strength.     Right upper leg: Normal.     Left upper leg: Normal.  Neurological:      Mental Status: She is alert.     No results found for any visits on 02/09/24.    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 She had an urgent care visit on 02/01/2024. Took ibuprofen  2tabs every 6hrs with moderate relief. No weakness, no paresthesia, no limited ROM.  Advised to continue use of NSAID and muscle relaxant prn Alternate between warm and cold compress as needed      Morbid obesity (HCC) - Primary   Did not start saxenda  due to fear of possible side effects. Did not schedule appointment with nutritionist due to conflict with work schedule. Wt Readings from Last 3 Encounters:  02/09/24 269 lb 3.2 oz (122.1 kg)  02/01/24 257 lb (116.6 kg)  01/11/24 266 lb 3.2 oz (120.7 kg)    Advised to schedule 58month f/up appointment after initiating lifestyle modifications and use of saxenda .      Return if symptoms worsen or fail to improve.     Roselie Mood, NP

## 2024-02-11 ENCOUNTER — Ambulatory Visit

## 2024-02-13 ENCOUNTER — Ambulatory Visit
Admission: EM | Admit: 2024-02-13 | Discharge: 2024-02-13 | Disposition: A | Discharge status: Home / Self Care | Attending: Internal Medicine | Admitting: Internal Medicine

## 2024-02-13 ENCOUNTER — Encounter: Payer: Self-pay | Admitting: Emergency Medicine

## 2024-02-13 DIAGNOSIS — J069 Acute upper respiratory infection, unspecified: Secondary | ICD-10-CM | POA: Diagnosis not present

## 2024-02-13 DIAGNOSIS — J45909 Unspecified asthma, uncomplicated: Secondary | ICD-10-CM | POA: Diagnosis not present

## 2024-02-13 LAB — POC COVID19/FLU A&B COMBO
Covid Antigen, POC: NEGATIVE
Influenza A Antigen, POC: NEGATIVE
Influenza B Antigen, POC: NEGATIVE

## 2024-02-13 MED ORDER — GUAIFENESIN ER 1200 MG PO TB12: 1200.0000 mg | ORAL_TABLET | Freq: Two times a day (BID) | ORAL | 0 refills | Status: DC

## 2024-02-13 MED ORDER — PROMETHAZINE-DM 6.25-15 MG/5ML PO SYRP: 5.0000 mL | ORAL_SOLUTION | Freq: Every evening | ORAL | 0 refills | Status: DC | PRN

## 2024-02-13 NOTE — ED Provider Notes (Signed)
 [facility]    CSN: [id number] Arrival date & time: 02/13/24  9060      History   Chief Complaint Chief Complaint  Patient presents with   Cough    HPI Wanda Douglas is a 53 y.o. female.   Wanda Douglas is a 53 y.o. female presenting for chief complaint of cough, nasal congestion, sore throat, generalized fatigue, and bodyaches/chills that started 2 days ago on Friday, February 11, 2024.  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  Cough is minimally productive with yellow/green sputum.  History of asthma, she has needed to use her albuterol  inhaler 1-2 times with relief of shortness of breath.  Denies current shortness of breath, chest pain, and heart palpitations.  Denies recent asthma exacerbations/cigarette use.  Never smoker and never vape user.  She had a headache on day 1 of symptoms but headache is resolved at this time.  Denies nausea, vomiting, diarrhea, abdominal pain, rashes, and dizziness.  Taking DayQuil with minimal relief of symptoms.   Cough   Past Medical History:  Diagnosis Date   Allergy    SEASONAL   Anemia    Arthritis    ANKLE,LEFT   Asthma    Bloody discharge from left nipple 07/2017   Cellulitis    COVID-19 (04/30/2020) 05/06/2020   History of COVID-19 04/29/2020   Hyperlipidemia    ELEVATED IN NOVEMBER,2023   Irregular menstrual cycle    Left breast mass 07/2017   Pituitary adenoma 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    Past Surgical History:  Procedure Laterality Date   BREAST DUCTAL SYSTEM EXCISION Left 07/26/2017   Procedure: EXCISION DUCTAL SYSTEM BREAST;  Surgeon: Ebbie Cough, MD;  Location: [facility];  Service: General;  Laterality: Left;   BREAST DUCTAL SYSTEM EXCISION Bilateral 05/23/2020   Procedure: BIL BREAST CENTRAL DUCT EXCISION;  Surgeon: Ebbie Cough, MD;  Location: [facility];  Service: General;  Laterality: Bilateral;   BREAST SURGERY     RADIOACTIVE SEED GUIDED EXCISIONAL BREAST BIOPSY Left 07/26/2017   Procedure: LEFT RADIOACTIVE SEED GUIDED EXCISIONAL BREAST BIOPSY ERAS PATHWAY;  Surgeon: Ebbie Cough, MD;  Location: [facility];  Service: General;  Laterality: Left;   RADIOACTIVE SEED GUIDED EXCISIONAL BREAST BIOPSY Right 06/16/2018   Procedure: RADIOACTIVE SEED GUIDED EXCISIONAL RIGHT BREAST BIOPSY;  Surgeon: Ebbie Cough, MD;  Location: [facility];  Service: General;  Laterality: Right;   right breast biopsy Right 2019   With axillary lymph node biopsy   TONSILLECTOMY      OB History     Gravida  0   Para  0   Term  0   Preterm  0   AB  0   Living  0      SAB  0   IAB  0  Ectopic  0   Multiple  0   Live Births  0        Obstetric Comments  virgin          Home Medications    Prior to Admission medications   Medication Sig Start Date End Date Taking? Authorizing Provider  Guaifenesin 1200 MG TB12 Take 1 tablet (1,200 mg total) by mouth in the morning and at bedtime. 02/13/24  Yes Enedelia Dorna HERO, FNP  promethazine -dextromethorphan (PROMETHAZINE -DM) 6.25-15 MG/5ML syrup Take 5 mLs by mouth at bedtime as needed for cough. 02/13/24  Yes Enedelia Dorna HERO, FNP  albuterol  (VENTOLIN  HFA) 108 (90 Base) MCG/ACT inhaler 1 to 2 puffs (5  minutes apart) every 4 hour as needed for Asthma 04/13/23   Wilkinson, Dana E, NP  cetirizine  (ZYRTEC ) 10 MG tablet Take 1 tablet (10 mg total) by mouth daily. 03/30/22   Wilkinson, Dana E, NP  cholecalciferol (VITAMIN D ) 1000 UNITS tablet Take 1,000 Units by mouth daily.    [provider]  EPINEPHrine  0.3 mg/0.3 mL IJ SOAJ injection Inject 0.3 mg into the muscle as needed for anaphylaxis. 11/28/23   Rancour, Garnette, MD  fluticasone  (FLONASE ) 50 MCG/ACT nasal spray Place 1 spray into both nostrils daily for 3 days. 09/03/18 02/09/24  Soto, Johana, PA-C  Liraglutide  -Weight Management (SAXENDA ) 18 MG/3ML SOPN Inject 0.6mg  daily x 1week, then 1.2mg  daily till next appointment in 4weeks 01/11/24   Nche, Roselie Rockford, NP  medroxyPROGESTERone  (PROVERA ) 5 MG tablet Take 1 tablet (5 mg total) by mouth daily for 7 days. 1 tab PO daily x 7 days every 3 months if no natural menstrual period. 05/11/22 02/09/24  Lavoie, Marie-Lyne, MD  methocarbamol (ROBAXIN) 500 MG tablet Take 1 tablet (500 mg total) by mouth 2 (two) times daily. 02/01/24   Mecum, Erin E, PA-C  Multiple Vitamin (MULTIVITAMIN) tablet Take 1 tablet by mouth daily.    [provider]  rosuvastatin  (CRESTOR ) 5 MG tablet Take 1 tablet (5 mg total) by mouth daily. 01/11/24 01/10/25  Nche, Roselie Rockford, NP  SUMAtriptan  (IMITREX ) 50 MG tablet Take 1 tablet (50 mg total) by mouth once as needed for migraine. May repeat in 2 hours if headache persists or recurs. No more than 2 tabs in 24 hours 04/13/23 04/13/24  Jude Lonell BRAVO, NP    Family History Family History  Problem Relation Age of Onset   Hypertension Mother    Spina bifida Mother    Drug abuse Mother    Bladder Cancer Mother        smoker   Myopathy Father        Inclusion myolitis   Colon cancer Maternal Aunt        RARE FORM ASSOSIATED WITH HIV   Lung cancer Maternal Grandmother        smoker   Rheum arthritis Maternal Grandmother    Colon cancer Maternal Grandfather     Hypertension Maternal Grandfather    Diabetes Maternal Grandfather    Breast cancer Paternal Grandmother    Diabetes Paternal Grandmother    Bladder Cancer Paternal Grandmother    Colon polyps Neg Hx    Crohn's disease Neg Hx    Esophageal cancer Neg Hx    Rectal cancer Neg Hx    Stomach cancer Neg Hx    Ulcerative colitis Neg Hx     Social History Social History   Tobacco Use   Smoking status: Never    Passive exposure: Current (FAMILY MEMBERS SMOKE)  Smokeless tobacco: Never  Vaping Use   Vaping status: Never Used  Substance Use Topics   Alcohol use: Yes    Comment: OCCASSIONALLY   Drug use: No     Allergies   Banana, Peanut-containing drug products, Shellfish allergy, Chlorhexidine , and Bactrim  [sulfamethoxazole -trimethoprim ]   Review of Systems Review of Systems  Respiratory:  Positive for cough.   Per HPI   Physical Exam Triage Vital Signs ED Triage Vitals  Encounter Vitals Group     BP 02/13/24 1026 124/85     Girls Systolic BP Percentile --      Girls Diastolic BP Percentile --      Boys Systolic BP Percentile --      Boys Diastolic BP Percentile --      Pulse Rate 02/13/24 1026 79     Resp 02/13/24 1026 17     Temp 02/13/24 1026 98.2 F (36.8 C)     Temp Source 02/13/24 0954 Oral     SpO2 02/13/24 1026 94 %     Weight --      Height --      Head Circumference --      Peak Flow --      Pain Score 02/13/24 1026 0     Pain Loc --      Pain Education --      Exclude from Growth Chart --    No data found.  Updated Vital Signs BP 124/85 (BP Location: Right Arm)   Pulse 79   Temp 98.2 F (36.8 C) (Oral)   Resp 17   LMP 01/13/2022 (Approximate) Comment: Pt states not having sex pt states she is a virgin  SpO2 94%   Visual Acuity Right Eye Distance:   Left Eye Distance:   Bilateral Distance:    Right Eye Near:   Left Eye Near:    Bilateral Near:     Physical Exam Vitals and nursing note reviewed.  Constitutional:      Appearance:  She is not ill-appearing or toxic-appearing.  HENT:     Head: Normocephalic and atraumatic.     Right Ear: Hearing, tympanic membrane, ear canal and external ear normal.     Left Ear: Hearing, tympanic membrane, ear canal and external ear normal.     Nose: Congestion present.     Mouth/Throat:     Lips: Pink.     Mouth: Mucous membranes are moist. No injury or oral lesions.     Dentition: Normal dentition.     Tongue: No lesions.     Pharynx: Oropharynx is clear. Uvula midline. No pharyngeal swelling, oropharyngeal exudate, posterior oropharyngeal erythema, uvula swelling or postnasal drip.     Tonsils: No tonsillar exudate.  Eyes:     General: Lids are normal. Vision grossly intact. Gaze aligned appropriately.     Extraocular Movements: Extraocular movements intact.     Conjunctiva/sclera: Conjunctivae normal.  Neck:     Trachea: Trachea and phonation normal.  Cardiovascular:     Rate and Rhythm: Normal rate and regular rhythm.     Heart sounds: Normal heart sounds, S1 normal and S2 normal.  Pulmonary:     Effort: Pulmonary effort is normal. No respiratory distress.     Breath sounds: Normal breath sounds and air entry. No wheezing, rhonchi or rales.     Comments: Speaks in full sentences without difficulty or increased respiratory effort.  No adventitious sounds heard on auscultation of all lung fields bilaterally. Chest:     Chest  wall: No tenderness.  Musculoskeletal:     Cervical back: Neck supple.  Lymphadenopathy:     Cervical: No cervical adenopathy.  Skin:    General: Skin is warm and dry.     Capillary Refill: Capillary refill takes less than 2 seconds.     Findings: No rash.  Neurological:     General: No focal deficit present.     Mental Status: She is alert and oriented to person, place, and time. Mental status is at baseline.     Cranial Nerves: No dysarthria or facial asymmetry.  Psychiatric:        Mood and Affect: Mood normal.        Speech: Speech normal.         Behavior: Behavior normal.        Thought Content: Thought content normal.        Judgment: Judgment normal.      UC Treatments / Results  Labs (all labs ordered are listed, but only abnormal results are displayed) Labs Reviewed  POC COVID19/FLU A&B COMBO    EKG   Radiology No results found.  Procedures Procedures (including critical care time)  Medications Ordered in UC Medications - No data to display  Initial Impression / Assessment and Plan / UC Course  I have reviewed the triage vital signs and the nursing notes.  Pertinent labs & imaging results that were available during my care of the patient were reviewed by me and considered in my medical decision making (see chart for details).   1.  Viral URI with cough, asthma without acute exacerbation Suspect viral URI, viral syndrome.  Strep/viral testing: Point-of-care COVID-19 and flu testing are both negative.  No current signs of acute asthma exacerbation.  She is using her albuterol  inhaler minimally currently, however we have discussed that if she hears wheezing to the chest or is needing to use her albuterol  inhaler more frequently she needs to be seen again in urgent care for steroid.   Physical exam findings reassuring, vital signs hemodynamically stable, and lungs clear, therefore deferred imaging of the chest.  Advised supportive care/prescriptions for symptomatic relief as outlined in AVS.    Counseled patient on potential for adverse effects with medications prescribed/recommended today, strict ER and return-to-clinic precautions discussed, patient verbalized understanding.    Final Clinical Impressions(s) / UC Diagnoses   Final diagnoses:  Viral URI with cough     Discharge Instructions      You have a viral illness which will improve on its own with rest, fluids, and medications to help with your symptoms.  Tylenol , guaifenesin (plain mucinex), and saline nasal sprays may help relieve  symptoms.   Two teaspoons of honey in 1 cup of warm water every 4-6 hours may help with throat pains.  Humidifier in room at nighttime may help soothe cough (clean well daily).   Take Promethazine  DM cough medication to help with your cough at nighttime so that you are able to sleep. Do not drive, drink alcohol, or go to work while taking this medication since it can make you sleepy. Only take this at nighttime.   For chest pain, shortness of breath, inability to keep food or fluids down without vomiting, fever that does not respond to tylenol  or motrin , or any other severe symptoms, please go to the ER for further evaluation. Return to urgent care as needed, otherwise follow-up with PCP.       ED Prescriptions     Medication Sig Dispense  Auth. Provider   promethazine -dextromethorphan (PROMETHAZINE -DM) 6.25-15 MG/5ML syrup Take 5 mLs by mouth at bedtime as needed for cough. 118 mL Enedelia Going M, FNP   Guaifenesin 1200 MG TB12 Take 1 tablet (1,200 mg total) by mouth in the morning and at bedtime. 14 tablet Enedelia Going HERO, FNP      PDMP not reviewed this encounter.   Enedelia Going HERO, FNP 02/13/24 1050

## 2024-02-13 NOTE — Discharge Instructions (Signed)

## 2024-02-13 NOTE — ED Triage Notes (Signed)
 Pt c/o cough, congestion, and not feeling well since Friday. States she had low grade fever Friday

## 2024-02-17 ENCOUNTER — Ambulatory Visit

## 2024-02-25 ENCOUNTER — Ambulatory Visit (INDEPENDENT_AMBULATORY_CARE_PROVIDER_SITE_OTHER)

## 2024-02-25 ENCOUNTER — Telehealth: Payer: Self-pay

## 2024-02-25 DIAGNOSIS — Z23 Encounter for immunization: Secondary | ICD-10-CM | POA: Diagnosis not present

## 2024-02-25 NOTE — Telephone Encounter (Signed)
 Copied from CRM [id number]. Topic: General - Running Late >> Feb 25, 2024  2:28 PM Roselie BROCKS wrote: Patient/patient representative is calling because they are running late for an appointment.   Patient within 10 minutes. Late informed patient will have to reschedule after 10 minutes

## 2024-02-25 NOTE — Telephone Encounter (Signed)
 NOTED

## 2024-03-02 ENCOUNTER — Ambulatory Visit
Admission: RE | Admit: 2024-03-02 | Discharge: 2024-03-02 | Disposition: A | Discharge status: Home / Self Care | Source: Ambulatory Visit | Attending: Internal Medicine | Admitting: Internal Medicine

## 2024-03-02 ENCOUNTER — Ambulatory Visit: Admitting: Radiology

## 2024-03-02 VITALS — BP 114/77 | HR 82 | Temp 98.5°F | Resp 16

## 2024-03-02 DIAGNOSIS — R051 Acute cough: Secondary | ICD-10-CM

## 2024-03-02 DIAGNOSIS — J4521 Mild intermittent asthma with (acute) exacerbation: Secondary | ICD-10-CM | POA: Diagnosis not present

## 2024-03-02 DIAGNOSIS — R0602 Shortness of breath: Secondary | ICD-10-CM

## 2024-03-02 MED ORDER — PREDNISONE 20 MG PO TABS: 40.0000 mg | ORAL_TABLET | Freq: Every day | ORAL | 0 refills | Status: AC

## 2024-03-02 MED ORDER — PROMETHAZINE-DM 6.25-15 MG/5ML PO SYRP: 5.0000 mL | ORAL_SOLUTION | Freq: Every evening | ORAL | 0 refills | Status: DC | PRN

## 2024-03-02 MED ORDER — IPRATROPIUM-ALBUTEROL 0.5-2.5 (3) MG/3ML IN SOLN
3.0000 mL | Freq: Once | RESPIRATORY_TRACT | Status: AC
  Administered 2024-03-02: 3 mL via RESPIRATORY_TRACT

## 2024-03-02 NOTE — ED Provider Notes (Signed)
 [facility]    CSN: [id number] Arrival date & time: 03/02/24  1028      History   Chief Complaint Chief Complaint  Patient presents with   Cough    Persistent cough that will not go away. Has been lingering since a diagnosis of a virus a few weeks ago. Increased use of albuterol  inhaler. Tried to get in with primary physician but nothing available. - Entered by patient    HPI Wanda Douglas is a 53 y.o. female.   Wanda Douglas is a 53 y.o. female presenting for chief complaint of cough, nasal congestion, and generalized fatigue that started on February 11, 2024 (3 weeks ago).  Cough is mostly dry, nonproductive, and worse at nighttime.  History of asthma, using albuterol  inhaler 5-6 times per day for the last 3 to 4 days with relief of shortness of breath.  She feels chest tightness and shortness of breath associated with coughing.  She denies nausea, vomiting, diarrhea, abdominal pain, fever, chills, ear pain, and recent antibiotic or steroid use.  She recently received her pneumonia vaccine 6 days ago.  She states her symptoms were improving until she received the pneumonia vaccine then her symptoms became worse and she has noticed more wheezing to her chest.   Cough   Past Medical History:  Diagnosis Date   Allergy    SEASONAL   Anemia    Arthritis    ANKLE,LEFT   Asthma    Bloody discharge from left nipple 07/2017   Cellulitis    COVID-19 (04/30/2020) 05/06/2020   History of COVID-19 04/29/2020   Hyperlipidemia    ELEVATED IN NOVEMBER,2023   Irregular menstrual cycle    Left breast mass 07/2017   Pituitary adenoma [facility])     Patient Active Problem List   Diagnosis Date Noted   Acute bilateral low back pain without sciatica 02/09/2024   Postmenopause 01/11/2024   Family history of breast cancer in female 01/11/2024   Family history of bladder cancer 01/11/2024   Family history of colon cancer 01/11/2024   Insulin  resistance 03/05/2020    Vitamin D  deficiency 04/20/2018   Hyperlipidemia 10/18/2017   Morbid obesity (HCC) 10/18/2017   Intraductal papilloma 06/16/2017   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   Multiple food allergies 12/19/2013   Seasonal allergies 12/19/2013   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    Past Surgical History:  Procedure Laterality Date   BREAST DUCTAL SYSTEM EXCISION Left 07/26/2017   Procedure: EXCISION DUCTAL SYSTEM BREAST;  Surgeon: Ebbie Cough, MD;  Location: [facility];  Service: General;  Laterality: Left;   BREAST DUCTAL SYSTEM EXCISION Bilateral 05/23/2020   Procedure: BIL BREAST CENTRAL DUCT EXCISION;  Surgeon: Ebbie Cough, MD;  Location: [facility];  Service: General;  Laterality: Bilateral;   BREAST SURGERY     RADIOACTIVE SEED GUIDED EXCISIONAL BREAST BIOPSY Left 07/26/2017   Procedure: LEFT RADIOACTIVE SEED GUIDED EXCISIONAL BREAST BIOPSY ERAS PATHWAY;  Surgeon: Ebbie Cough, MD;  Location: [facility];  Service: General;  Laterality: Left;   RADIOACTIVE SEED GUIDED EXCISIONAL BREAST BIOPSY Right 06/16/2018   Procedure: RADIOACTIVE SEED GUIDED EXCISIONAL RIGHT BREAST BIOPSY;  Surgeon: Ebbie Cough, MD;  Location: [facility];  Service: General;  Laterality: Right;   right breast biopsy Right 2019   With axillary lymph node biopsy   TONSILLECTOMY      OB History     Gravida  0   Para  0  Term  0   Preterm  0   AB  0   Living  0      SAB  0   IAB  0   Ectopic  0   Multiple  0   Live Births  0        Obstetric Comments  virgin          Home Medications    Prior to Admission medications   Medication Sig Start Date End Date Taking? Authorizing Provider  predniSONE  (DELTASONE ) 20 MG tablet Take 2 tablets (40 mg total) by mouth daily with breakfast for 5 days. 03/02/24 03/07/24 Yes StanhopeDorna HERO, FNP  promethazine -dextromethorphan (PROMETHAZINE -DM)  6.25-15 MG/5ML syrup Take 5 mLs by mouth at bedtime as needed for cough. 03/02/24  Yes Enedelia Dorna HERO, FNP  albuterol  (VENTOLIN  HFA) 108 [phone] Base) MCG/ACT inhaler 1 to 2 puffs (5 minutes apart) every 4 hour as needed for Asthma 04/13/23   Wilkinson, Dana E, NP  cetirizine  (ZYRTEC ) 10 MG tablet Take 1 tablet (10 mg total) by mouth daily. 03/30/22   Wilkinson, Dana E, NP  cholecalciferol (VITAMIN D ) 1000 UNITS tablet Take 1,000 Units by mouth daily.    [provider]  EPINEPHrine  0.3 mg/0.3 mL IJ SOAJ injection Inject 0.3 mg into the muscle as needed for anaphylaxis. 11/28/23   Rancour, Garnette, MD  fluticasone  (FLONASE ) 50 MCG/ACT nasal spray Place 1 spray into both nostrils daily for 3 days. 09/03/18 02/09/24  Soto, Johana, PA-C  Guaifenesin 1200 MG TB12 Take 1 tablet (1,200 mg total) by mouth in the morning and at bedtime. 02/13/24   Enedelia Dorna HERO, FNP  Liraglutide  -Weight Management (SAXENDA ) 18 MG/3ML SOPN Inject 0.6mg  daily x 1week, then 1.2mg  daily till next appointment in 4weeks 01/11/24   Nche, Roselie Rockford, NP  medroxyPROGESTERone  (PROVERA ) 5 MG tablet Take 1 tablet (5 mg total) by mouth daily for 7 days. 1 tab PO daily x 7 days every 3 months if no natural menstrual period. 05/11/22 02/09/24  Lavoie, Marie-Lyne, MD  methocarbamol (ROBAXIN) 500 MG tablet Take 1 tablet (500 mg total) by mouth 2 (two) times daily. 02/01/24   Mecum, Erin E, PA-C  Multiple Vitamin (MULTIVITAMIN) tablet Take 1 tablet by mouth daily.    [provider]  rosuvastatin  (CRESTOR ) 5 MG tablet Take 1 tablet (5 mg total) by mouth daily. 01/11/24 01/10/25  Nche, Roselie Rockford, NP  SUMAtriptan  (IMITREX ) 50 MG tablet Take 1 tablet (50 mg total) by mouth once as needed for migraine. May repeat in 2 hours if headache persists or recurs. No more than 2 tabs in 24 hours 04/13/23 04/13/24  Jude Lonell BRAVO, NP    Family History Family History  Problem Relation Age of Onset   Hypertension Mother    Spina  bifida Mother    Drug abuse Mother    Bladder Cancer Mother        smoker   Myopathy Father        Inclusion myolitis   Colon cancer Maternal Aunt        RARE FORM ASSOSIATED WITH HIV   Lung cancer Maternal Grandmother        smoker   Rheum arthritis Maternal Grandmother    Colon cancer Maternal Grandfather    Hypertension Maternal Grandfather    Diabetes Maternal Grandfather    Breast cancer Paternal Grandmother    Diabetes Paternal Grandmother    Bladder Cancer Paternal Grandmother    Colon polyps Neg Hx  Crohn's disease Neg Hx    Esophageal cancer Neg Hx    Rectal cancer Neg Hx    Stomach cancer Neg Hx    Ulcerative colitis Neg Hx     Social History Social History   Tobacco Use   Smoking status: Never    Passive exposure: Current (FAMILY MEMBERS SMOKE)   Smokeless tobacco: Never  Vaping Use   Vaping status: Never Used  Substance Use Topics   Alcohol use: Yes    Comment: OCCASSIONALLY   Drug use: No     Allergies   Banana, Peanut-containing drug products, Shellfish allergy, Chlorhexidine , and Bactrim  [sulfamethoxazole -trimethoprim ]   Review of Systems Review of Systems  Respiratory:  Positive for cough.   Per HPI   Physical Exam Triage Vital Signs ED Triage Vitals [03/02/24 1041]  Encounter Vitals Group     BP 114/77     Girls Systolic BP Percentile      Girls Diastolic BP Percentile      Boys Systolic BP Percentile      Boys Diastolic BP Percentile      Pulse Rate 82     Resp 16     Temp 98.5 F (36.9 C)     Temp Source Oral     SpO2 96 %     Weight      Height      Head Circumference      Peak Flow      Pain Score 0     Pain Loc      Pain Education      Exclude from Growth Chart    No data found.  Updated Vital Signs BP 114/77 (BP Location: Right Arm)   Pulse 82   Temp 98.5 F (36.9 C) (Oral)   Resp 16   LMP 01/13/2022 (Approximate) Comment: Pt states not having sex pt states she is a virgin  SpO2 96%   Visual Acuity Right  Eye Distance:   Left Eye Distance:   Bilateral Distance:    Right Eye Near:   Left Eye Near:    Bilateral Near:     Physical Exam Vitals and nursing note reviewed.  Constitutional:      Appearance: She is not ill-appearing or toxic-appearing.  HENT:     Head: Normocephalic and atraumatic.     Right Ear: Hearing, tympanic membrane, ear canal and external ear normal.     Left Ear: Hearing, tympanic membrane, ear canal and external ear normal.     Nose: Nose normal.     Mouth/Throat:     Lips: Pink.     Mouth: Mucous membranes are moist. No injury or oral lesions.     Dentition: Normal dentition.     Tongue: No lesions.     Pharynx: Oropharynx is clear. Uvula midline. No pharyngeal swelling, oropharyngeal exudate, posterior oropharyngeal erythema, uvula swelling or postnasal drip.     Tonsils: No tonsillar exudate.  Eyes:     General: Lids are normal. Vision grossly intact. Gaze aligned appropriately.     Extraocular Movements: Extraocular movements intact.     Conjunctiva/sclera: Conjunctivae normal.  Neck:     Trachea: Trachea and phonation normal.  Cardiovascular:     Rate and Rhythm: Normal rate and regular rhythm.     Heart sounds: Normal heart sounds, S1 normal and S2 normal.  Pulmonary:     Effort: Pulmonary effort is normal. No respiratory distress.     Breath sounds: Normal air entry. Wheezing (Coarse breath sounds  and expiratory wheezing heard to the bilateral lower lung fields.) present. No rhonchi or rales.     Comments: Speaks in full sentences without difficulty or increased respiratory effort. Chest:     Chest wall: No tenderness.  Musculoskeletal:     Cervical back: Neck supple.  Lymphadenopathy:     Cervical: No cervical adenopathy.  Skin:    General: Skin is warm and dry.     Capillary Refill: Capillary refill takes less than 2 seconds.     Findings: No rash.  Neurological:     General: No focal deficit present.     Mental Status: She is alert and  oriented to person, place, and time. Mental status is at baseline.     Cranial Nerves: No dysarthria or facial asymmetry.  Psychiatric:        Mood and Affect: Mood normal.        Speech: Speech normal.        Behavior: Behavior normal.        Thought Content: Thought content normal.        Judgment: Judgment normal.      UC Treatments / Results  Labs (all labs ordered are listed, but only abnormal results are displayed) Labs Reviewed - No data to display  EKG   Radiology No results found.  Procedures Procedures (including critical care time)  Medications Ordered in UC Medications  ipratropium-albuterol  (DUONEB) 0.5-2.5 (3) MG/3ML nebulizer solution 3 mL (3 mLs Nebulization Given 03/02/24 1047)    Initial Impression / Assessment and Plan / UC Course  I have reviewed the triage vital signs and the nursing notes.  Pertinent labs & imaging results that were available during my care of the patient were reviewed by me and considered in my medical decision making (see chart for details).   1.  Mild intermittent asthma with acute exacerbation, shortness of breath, acute cough Presentation consistent with acute asthma exacerbation.   DuoNeb nebulizer treatment given with significant symptomatic improvement reported by patient and lung sounds on reassessment.   Chest x-ray shows no focal abnormality/consolidation concerning for pneumonia by my interpretation on wet read.  Patient discharged prior to radiology re-read, staff will notify patient if radiology re-read requires change in treatment plan at discussed prior to discharge from office.    Viral testing: Deferred given timing of illness.  Will treat with prednisone  burst, albuterol  PRN, and antitussive  PRN at bedtime.   Discussed PCP follow-up to discuss asthma action plan to prevent future asthma exacerbations.   Counseled patient on potential for adverse effects with medications prescribed/recommended today, strict ER  and return-to-clinic precautions discussed, patient verbalized understanding.    Final Clinical Impressions(s) / UC Diagnoses   Final diagnoses:  Acute cough  Shortness of breath  Mild intermittent asthma with acute exacerbation     Discharge Instructions      Your symptoms are due to asthma attack. Chest x-ray looks good, no signs of pneumonia on my read. Staff will call if radiology re-read shows abnormality requiring change in treatment plan.   - Use albuterol  inhaler 2 puffs every 4-6 hours on a schedule for the next 24 hours, then as needed for cough, shortness of breath, and wheeze. - Take the steroid sent to the pharmacy as directed to help reduce lung inflammation and decrease the risk of another attack in the next few days. No NSAIDs like ibuprofen  or aleve/naproxen while taking steroid. Take with food to avoid stomach upset. - Cough medication as needed. Promethazine   DM will cause drowsiness, only use this at bedtime.   If your symptoms do not improve in the next 2-3 days with interventions, please return. Please seek medical care for new or returning symptoms, such as difficulty breathing that doesn't improve with your medications, chest pain, voice changes, high fevers, confusion, or other new or worsening symptoms. Follow-up with PCP for ongoing management of asthma.      ED Prescriptions     Medication Sig Dispense Auth. Provider   predniSONE  (DELTASONE ) 20 MG tablet Take 2 tablets (40 mg total) by mouth daily with breakfast for 5 days. 10 tablet Enedelia Dorna HERO, FNP   promethazine -dextromethorphan (PROMETHAZINE -DM) 6.25-15 MG/5ML syrup Take 5 mLs by mouth at bedtime as needed for cough. 118 mL Enedelia Dorna HERO, FNP      PDMP not reviewed this encounter.   Enedelia Dorna HERO, [place] 03/02/24 1123

## 2024-03-02 NOTE — Discharge Instructions (Addendum)
 Your symptoms are due to asthma attack. Chest x-ray looks good, no signs of pneumonia on my read. Staff will call if radiology re-read shows abnormality requiring change in treatment plan.   - Use albuterol  inhaler 2 puffs every 4-6 hours on a schedule for the next 24 hours, then as needed for cough, shortness of breath, and wheeze. - Take the steroid sent to the pharmacy as directed to help reduce lung inflammation and decrease the risk of another attack in the next few days. No NSAIDs like ibuprofen  or aleve/naproxen while taking steroid. Take with food to avoid stomach upset. - Cough medication as needed. Promethazine  DM will cause drowsiness, only use this at bedtime.   If your symptoms do not improve in the next 2-3 days with interventions, please return. Please seek medical care for new or returning symptoms, such as difficulty breathing that doesn't improve with your medications, chest pain, voice changes, high fevers, confusion, or other new or worsening symptoms. Follow-up with PCP for ongoing management of asthma.

## 2024-03-02 NOTE — ED Triage Notes (Signed)
 Pt c/o consist cough for 3-4 weeks which began to get better but last Friday she got pneumonia shot. Since then her cough and wheezing has gotten worse.

## 2024-03-14 ENCOUNTER — Ambulatory Visit (INDEPENDENT_AMBULATORY_CARE_PROVIDER_SITE_OTHER)

## 2024-03-14 DIAGNOSIS — Z23 Encounter for immunization: Secondary | ICD-10-CM

## 2024-03-14 NOTE — Progress Notes (Signed)
 After obtaining consent, and per orders of Roselie Mood, NP, injection of Shingrix  given in right delt by Armenta Check, CMA. Patient tolerated injection well.

## 2024-03-23 ENCOUNTER — Ambulatory Visit: Admitting: Dietician

## 2024-04-03 ENCOUNTER — Encounter: Payer: Self-pay | Admitting: Nurse Practitioner

## 2024-04-12 ENCOUNTER — Encounter: Payer: Self-pay | Admitting: Nurse Practitioner

## 2024-05-24 ENCOUNTER — Other Ambulatory Visit: Payer: Self-pay | Admitting: Nurse Practitioner

## 2024-05-24 DIAGNOSIS — J45909 Unspecified asthma, uncomplicated: Secondary | ICD-10-CM

## 2024-05-24 DIAGNOSIS — Z79899 Other long term (current) drug therapy: Secondary | ICD-10-CM

## 2024-05-25 ENCOUNTER — Telehealth: Payer: Self-pay | Admitting: Nurse Practitioner

## 2024-05-25 DIAGNOSIS — Z79899 Other long term (current) drug therapy: Secondary | ICD-10-CM

## 2024-05-25 DIAGNOSIS — J45909 Unspecified asthma, uncomplicated: Secondary | ICD-10-CM

## 2024-05-25 MED ORDER — ALBUTEROL SULFATE HFA 108 (90 BASE) MCG/ACT IN AERS: INHALATION_SPRAY | RESPIRATORY_TRACT | 0 refills | Status: AC

## 2024-05-25 NOTE — Telephone Encounter (Unsigned)
 Copied from CRM #[id number]. Topic: Clinical - Medication Refill >> May 25, 2024  9:03 AM Adelita E wrote: Medication: albuterol  (VENTOLIN  HFA) 108 (90 Base) MCG/ACT inhaler *Patient stated the UC doctor denied the refill, questioning if PCP can fill.  Has the patient contacted their pharmacy? Yes (Agent: If no, request that the patient contact the pharmacy for the refill. If patient does not wish to contact the pharmacy document the reason why and proceed with request.) (Agent: If yes, when and what did the pharmacy advise?)  This is the patient's preferred pharmacy:  [id number] - [place], [place] - [address] AT [facility] [address] [place] [address] Phone: [phone] Fax: [phone]  Is this the correct pharmacy for this prescription? Yes If no, delete pharmacy and type the correct one.   Has the prescription been filled recently? No  Is the patient out of the medication? Yes  Has the patient been seen for an appointment in the last year OR does the patient have an upcoming appointment? Yes  Can we respond through MyChart? Yes  Agent: Please be advised that Rx refills may take up to 3 business days. We ask that you follow-up with your pharmacy.

## 2024-05-25 NOTE — Telephone Encounter (Signed)
 Pt walked in requesting for refill on albuterol  (VENTOLIN  HFA) 108 (90 Base) MCG/ACT inhaler.

## 2024-05-25 NOTE — Telephone Encounter (Signed)
 Patient walked into the office asking if Roselie can send to her pharmacy the albuterol  inhaler since her previous office denied the refill. I asked if she can give me until tomorrow morning patient stated that she is can not wait until tomorrow that she needs it today. I informed her that I would have to speak with DOD since her PCP is out of the office at the moment. I went to speak with DOD and he was seeing patients at the time so I went to Tinnie Harada, NP to ask if she can refill the inhaler. Per Tinnie Harada, NP she is okay with sending in the refill for the inhaler.

## 2024-05-25 NOTE — Telephone Encounter (Signed)
 Agree with note.

## 2024-05-27 ENCOUNTER — Inpatient Hospital Stay: Admission: RE | Admit: 2024-05-27 | Discharge: 2024-05-27 | Attending: Nurse Practitioner

## 2024-05-27 ENCOUNTER — Other Ambulatory Visit: Payer: Self-pay

## 2024-05-27 VITALS — BP 132/84 | HR 83 | Temp 98.4°F | Resp 18

## 2024-05-27 DIAGNOSIS — F4321 Adjustment disorder with depressed mood: Secondary | ICD-10-CM | POA: Diagnosis not present

## 2024-05-27 DIAGNOSIS — R0989 Other specified symptoms and signs involving the circulatory and respiratory systems: Secondary | ICD-10-CM
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
## 2024-05-27 NOTE — Discharge Instructions (Addendum)
 Based on your described symptoms and the duration of symptoms it is likely that you have a viral upper respiratory infection (often called a cold)  Symptoms can last for 3-10 days with lingering cough and intermittent symptoms potentially  lasting several  weeks after that.  The goal of treatment at this time is to reduce your symptoms and discomfort   You can use the following medications and measures to help yourself feel better until your body fights this off: DayQuil/NyQuil, TheraFlu, Alka-Seltzer  (these medications typically have the same active ingredients in them so you can choose whichever one you prefer and take consistently during the day and night according to the manufactures instructions.) Flonase  A daily antihistamine such as Zyrtec , Claritin, Allegra  per your preference.  Please choose 1 and take consistently. Increased fluids.  It is recommended that you take in at least 64 ounces of water per day when you are not sick so it is important to increase this when you are sick and your body may be running fever. Rest Cough drops Chloraseptic throat spray to help with sore throat Nasal saline spray or nasal flushes to help with congestion and runny nose  If your symptoms seem like they are getting worse over the next 5 to 7 days or not improving you can always follow-up here in urgent care or go to your primary care provider for further management. Go to the ER if you begin to have more serious symptoms such as shortness of breath, trouble breathing, loss of consciousness, swelling around the eyes, high fever, severe lasting headaches, vision changes or neck pain/stiffness.   During your appointment we also discussed your concerns for your mood and depressed state.  We provided you with resources from a behavioral health urgent care and I do recommend you follow-up with your primary care regarding a referral to therapy services and medication management for your symptoms.  Included in  your paperwork is some information about different  medications that I think would be a good option for you to try so that you can review this and discuss with your PCP when you have the chance.  I have also included some information about managing depressed mood and adjustment disorder to help you until you are able to see a therapist/counselor.  If you are unable to see your primary care provider in the next few days or you feel like you are having a more urgent mental health crisis please return here or go to the mental health urgent care listed in the paperwork that you were provided with.
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
## 2024-05-29 ENCOUNTER — Encounter: Payer: Self-pay | Admitting: Nurse Practitioner

## 2024-05-29 ENCOUNTER — Encounter: Payer: Self-pay | Admitting: Family Medicine

## 2024-05-29 ENCOUNTER — Telehealth: Admitting: Family Medicine

## 2024-05-29 DIAGNOSIS — F321 Major depressive disorder, single episode, moderate: Secondary | ICD-10-CM | POA: Insufficient documentation

## 2024-05-29 DIAGNOSIS — F418 Other specified anxiety disorders: Secondary | ICD-10-CM | POA: Insufficient documentation

## 2024-05-29 DIAGNOSIS — F419 Anxiety disorder, unspecified: Secondary | ICD-10-CM | POA: Diagnosis not present

## 2024-05-29 DIAGNOSIS — F5105 Insomnia due to other mental disorder: Secondary | ICD-10-CM | POA: Insufficient documentation

## 2024-05-29 DIAGNOSIS — F99 Mental disorder, not otherwise specified: Secondary | ICD-10-CM | POA: Diagnosis not present

## 2024-05-29 MED ORDER — MIRTAZAPINE 15 MG PO TBDP: 15.0000 mg | ORAL_TABLET | Freq: Every day | ORAL | 3 refills | Status: DC

## 2024-05-29 NOTE — Telephone Encounter (Signed)
 Called and spoke with patient. She is not have any current SI/HI. Due to worsening depression/stress. Acute visit scheduled with Dr. Lynn today virtually at 2pm.

## 2024-05-29 NOTE — Progress Notes (Signed)
 Virtual Visit via Video Note  I connected with Wanda Douglas on 05/29/2024 at  2:00 PM EST by a video enabled telemedicine application and verified that I am speaking with the correct person using two identifiers.  Location: Patient: home Provider: office   I discussed the limitations of evaluation and management by telemedicine and the availability of in person appointments. The patient expressed understanding and agreed to proceed.  History of Present Illness Wanda Douglas is 54 y.o. year old female who presents with concerns about depression and anxiety following family losses and workplace stress.  Depressive symptoms - Little interest or pleasure in activities, rated as two on a scale where three is nearly every day - Feeling down or hopeless, rated as two - Frequent crying episodes - Difficulty sleeping, particularly staying asleep, with increased nighttime awakenings over the past couple of weeks - Poor appetite and episodes of overeating; low appetite in the last few days, rated as two - Feelings of worthlessness or failure, rated as two to three - Difficulty with concentration - Restlessness, rated as two - No recent thoughts of self-harm, but had such thoughts a couple of months ago  Anxiety symptoms - Feeling nervous or on edge, rated as one - Difficulty controlling worrying, rated as three - Excessive worry about various issues - Trouble relaxing, rated as three - Easily annoyed or irritable, rated as one - Feeling afraid as if something awful might happen, rated as one  Psychosocial stressors - Significant emotional distress following recent family losses - Challenging work environment with an incident on Friday leading to psychological and emotional unsafety - 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, systems, never previously encountered such a demoralizing environment  Functional impairment - Very difficult to perform work and manage home responsibilities due to  symptoms - Work performance is variable, with some productive days and others not - Unable to complete tasks at home recently - Able to get along with others       05/29/2024    1:58 PM 01/11/2024    8:32 AM 02/05/2020    3:26 PM 10/20/2018    2:21 PM 10/19/2017    2:26 PM  Depression screen PHQ 2/9  Decreased Interest 2 0 0 0 0  Down, Depressed, Hopeless 3 1 1 1  0  PHQ - 2 Score 5 1 1 1  0  Altered sleeping 3 0 0    Tired, decreased energy 2 1 0    Change in appetite 2 2 0    Feeling bad or failure about yourself  3 1 0    Trouble concentrating 2 0 0    Moving slowly or fidgety/restless 2 0 0    Suicidal thoughts 0 0 0    PHQ-9 Score 19 5  1      Difficult doing work/chores Very difficult Somewhat difficult Not difficult at all       Data saved with a previous flowsheet row definition      05/29/2024    2:02 PM 01/11/2024    8:33 AM  GAD 7 : Generalized Anxiety Score  Nervous, Anxious, on Edge 1 0   Control/stop worrying 3 0   Worry too much - different things 3 0   Trouble relaxing 3 1   Restless 2 0   Easily annoyed or irritable 1 1   Afraid - awful might happen 1 0   Total GAD 7 Score 14 2  Anxiety Difficulty Somewhat difficult Not difficult at all  Data saved with a previous flowsheet row definition     Observations/Objective: There were no vitals filed for this visit.  Gen: NAD, resting comfortably HEENT: EOMI Pulm: NWOB Skin: no rash on face Neuro: no facial asymmetry or dysmetria Psych: Tearful   Assessment and Plan: Assessment and Plan Major depressive disorder with anxiety Experiencing significant stress due to family losses and workplace bullying, leading to symptoms of depression and anxiety. Reports feeling down, hopeless, and having trouble concentrating. Expressed interest in therapy before starting medication. Prefers to explore therapy options first before considering medication. - Referred to behavioral health for counseling and therapy. -  Prescribed mirtazapine  15 mg at night to aid with sleep and potentially improve mood. - Provided a note for sick leave for the remainder of the week to allow time for rest and adjustment.  Insomnia Reports difficulty staying asleep, with frequent awakenings at night. This is a new pattern for her, and she has not previously used sleep aids. Interested in trying medication to help with sleep. - Prescribed mirtazapine  15 mg at night to aid with sleep.      I discussed the assessment and treatment plan with the patient. The patient was provided an opportunity to ask questions and all were answered. The patient agreed with the plan and demonstrated an understanding of the instructions.   The patient was advised to call back or seek an in-person evaluation if the symptoms worsen or if the condition fails to improve as anticipated.     Beverley KATHEE Hummer, MD

## 2024-05-30 ENCOUNTER — Encounter: Payer: Self-pay | Admitting: Nurse Practitioner

## 2024-05-31 ENCOUNTER — Encounter: Payer: Self-pay | Admitting: Nurse Practitioner

## 2024-05-31 ENCOUNTER — Ambulatory Visit: Admitting: Nurse Practitioner

## 2024-05-31 VITALS — BP 118/70 | HR 80 | Temp 98.6°F | Ht 63.0 in | Wt 261.2 lb

## 2024-05-31 DIAGNOSIS — E559 Vitamin D deficiency, unspecified: Secondary | ICD-10-CM

## 2024-05-31 DIAGNOSIS — F4321 Adjustment disorder with depressed mood: Secondary | ICD-10-CM | POA: Diagnosis not present

## 2024-05-31 DIAGNOSIS — F99 Mental disorder, not otherwise specified: Secondary | ICD-10-CM | POA: Diagnosis not present

## 2024-05-31 DIAGNOSIS — E782 Mixed hyperlipidemia: Secondary | ICD-10-CM | POA: Diagnosis not present

## 2024-05-31 DIAGNOSIS — J4520 Mild intermittent asthma, uncomplicated: Secondary | ICD-10-CM

## 2024-05-31 DIAGNOSIS — F5105 Insomnia due to other mental disorder: Secondary | ICD-10-CM

## 2024-05-31 DIAGNOSIS — F418 Other specified anxiety disorders: Secondary | ICD-10-CM | POA: Diagnosis not present

## 2024-05-31 DIAGNOSIS — R7303 Prediabetes: Secondary | ICD-10-CM

## 2024-05-31 LAB — COMPREHENSIVE METABOLIC PANEL WITH GFR
ALT: 14 U/L (ref 3–35)
AST: 16 U/L (ref 5–37)
Albumin: 3.8 g/dL (ref 3.5–5.2)
Alkaline Phosphatase: 102 U/L (ref 39–117)
BUN: 14 mg/dL (ref 6–23)
CO2: 29 meq/L (ref 19–32)
Calcium: 9.2 mg/dL (ref 8.4–10.5)
Chloride: 105 meq/L (ref 96–112)
Creatinine, Ser: 0.87 mg/dL (ref 0.40–1.20)
GFR: 76.18 mL/min
Glucose, Bld: 90 mg/dL (ref 70–99)
Potassium: 3.8 meq/L (ref 3.5–5.1)
Sodium: 138 meq/L (ref 135–145)
Total Bilirubin: 0.5 mg/dL (ref 0.2–1.2)
Total Protein: 7.2 g/dL (ref 6.0–8.3)

## 2024-05-31 LAB — LIPID PANEL
Cholesterol: 183 mg/dL (ref 28–200)
HDL: 48.6 mg/dL
LDL Cholesterol: 118 mg/dL — ABNORMAL HIGH (ref 10–99)
NonHDL: 134.01
Total CHOL/HDL Ratio: 4
Triglycerides: 80 mg/dL (ref 10.0–149.0)
VLDL: 16 mg/dL (ref 0.0–40.0)

## 2024-05-31 LAB — TSH: TSH: 0.45 u[IU]/mL (ref 0.35–5.50)

## 2024-05-31 LAB — VITAMIN D 25 HYDROXY (VIT D DEFICIENCY, FRACTURES): VITD: 24.18 ng/mL — ABNORMAL LOW (ref 30.00–100.00)

## 2024-05-31 LAB — HEMOGLOBIN A1C: Hgb A1c MFr Bld: 6 % (ref 4.6–6.5)

## 2024-05-31 MED ORDER — HYDROXYZINE PAMOATE 25 MG PO CAPS: 25.0000 mg | ORAL_CAPSULE | Freq: Every evening | ORAL | 2 refills | Status: AC | PRN

## 2024-05-31 NOTE — Assessment & Plan Note (Signed)
 No need for ICS Use of albuterol  prn Triggers are viral illness and seasonal allergies

## 2024-05-31 NOTE — Assessment & Plan Note (Signed)
 Secondary to grief and workplace conflict. Interfers with sleep States she can not return to work. She feels unsafe due to behavior of another colleague. She has upcoming appointment with [place] about removal of her current job. Did not start remeron  as prescribed by Dr. Sebastian. She prefers to talk with a behavioral health specialist first and is concerned about possible side effects of remeron  (increased appetite)  Switched remeron  to vistaril  Advised to schedule appointment with therapist-contact number provided F/up in 1-83months

## 2024-05-31 NOTE — Assessment & Plan Note (Signed)
"  Repeat vit D   "

## 2024-05-31 NOTE — Patient Instructions (Signed)
 Schedule appointment with therapist at [facility]:  [phone] Schedule an appointment with the nutritionist Use vistaril  25-50mg  for sleep. Go to lab

## 2024-05-31 NOTE — Assessment & Plan Note (Addendum)
 Exercise: walkig 10,000steps daily Diet: Has avoided fasting food, and alcohol x 21days She opted not to start saxenda  at this time Did not schedule appointment with nutritionist. 8lbs weight loss noted in last 3months Wt Readings from Last 3 Encounters:  05/31/24 261 lb 3.2 oz (118.5 kg)  02/09/24 269 lb 3.2 oz (122.1 kg)  02/01/24 257 lb (116.6 kg)    Advised to schedule appointment with nutritionist Advised to maintain of moderate intensity exercise weekly Check THYROID , hgbA1c, CMP, and vit. D

## 2024-05-31 NOTE — Progress Notes (Signed)
 "               Established Patient Visit Patient: Wanda Douglas   DOB: [birth year]   54 y.o. Female  MRN: [id number] Visit Date: 05/31/2024  Subjective:    Chief Complaint  Patient presents with   Follow-up    Follow up for A1c Symptoms of night sweats, frequent urinating and shaking for 1 week    HPI Morbid obesity (HCC) Exercise: walkig 10,000steps daily Diet: Has avoided fasting food, and alcohol x 21days She opted not to start saxenda  at this time Did not schedule appointment with nutritionist. 8lbs weight loss noted in last 3months Wt Readings from Last 3 Encounters:  05/31/24 261 lb 3.2 oz (118.5 kg)  02/09/24 269 lb 3.2 oz (122.1 kg)  02/01/24 257 lb (116.6 kg)    Advised to schedule appointment with nutritionist Advised to maintain of moderate intensity exercise weekly Check THYROID , hgbA1c, CMP, and vit. D  Intrinsic asthma No need for ICS Use of albuterol  prn Triggers are viral illness and seasonal allergies  Vitamin D  deficiency Repeat vit. D  Situational anxiety Secondary to grief and workplace conflict. Interfers with sleep States she can not return to work. She feels unsafe due to behavior of another colleague. She has upcoming appointment with [place] about removal of her current job. Did not start remeron  as prescribed by Dr. Sebastian. She prefers to talk with a behavioral health specialist first and is concerned about possible side effects of remeron  (increased appetite)  Switched remeron  to vistaril  Advised to schedule appointment with therapist-contact number provided F/up in 1-75months   Wt Readings from Last 3 Encounters:  05/31/24 261 lb 3.2 oz (118.5 kg)  02/09/24 269 lb 3.2 oz (122.1 kg)  02/01/24 257 lb (116.6 kg)    Reviewed medical, surgical, and social history today  Medications: Show/hide medication list[1] Reviewed past medical and social history.   ROS per HPI above      Objective:  BP 118/70 (BP Location:  Left Arm, Patient Position: Sitting, Cuff Size: Large)   Pulse 80   Temp 98.6 F (37 C) (Oral)   Ht 5' 3 (1.6 m)   Wt 261 lb 3.2 oz (118.5 kg)   LMP 01/13/2022 Comment: Pt states not having sex pt states she is a virgin  SpO2 97%   BMI 46.27 kg/m      Physical Exam Vitals reviewed.  Cardiovascular:     Rate and Rhythm: Normal rate and regular rhythm.     Pulses: Normal pulses.     Heart sounds: Normal heart sounds.  Pulmonary:     Effort: Pulmonary effort is normal.     Breath sounds: Normal breath sounds.  Neurological:     Mental Status: She is alert and oriented to person, place, and time.  Psychiatric:        Attention and Perception: Attention normal.        Mood and Affect: Affect normal. Mood is anxious.        Behavior: Behavior normal.        Thought Content: Thought content normal.        Cognition and Memory: Cognition and memory normal.        Judgment: Judgment normal.     No results found for any visits on 05/31/24.    Assessment & Plan:    Problem List Items Addressed This Visit     Hyperlipidemia   Relevant Orders   Lipid panel   Insomnia  due to other mental disorder   Intrinsic asthma   No need for ICS Use of albuterol  prn Triggers are viral illness and seasonal allergies      Morbid obesity (HCC)   Exercise: walkig 10,000steps daily Diet: Has avoided fasting food, and alcohol x 21days She opted not to start saxenda  at this time Did not schedule appointment with nutritionist. 8lbs weight loss noted in last 3months Wt Readings from Last 3 Encounters:  05/31/24 261 lb 3.2 oz (118.5 kg)  02/09/24 269 lb 3.2 oz (122.1 kg)  02/01/24 257 lb (116.6 kg)    Advised to schedule appointment with nutritionist Advised to maintain of moderate intensity exercise weekly Check THYROID , hgbA1c, CMP, and vit. D      Relevant Orders   Hemoglobin A1c   TSH   Situational anxiety - Primary   Secondary to grief and workplace conflict. Interfers  with sleep States she can not return to work. She feels unsafe due to behavior of another colleague. She has upcoming appointment with [place] about removal of her current job. Did not start remeron  as prescribed by Dr. Sebastian. She prefers to talk with a behavioral health specialist first and is concerned about possible side effects of remeron  (increased appetite)  Switched remeron  to vistaril  Advised to schedule appointment with therapist-contact number provided F/up in 1-40months        Relevant Medications   hydrOXYzine  (VISTARIL ) 25 MG capsule   Other Relevant Orders   Comprehensive metabolic panel with GFR   TSH   Vitamin D  deficiency   Repeat vit. D      Relevant Orders   Comprehensive metabolic panel with GFR   VITAMIN D  25 Hydroxy (Vit-D Deficiency, Fractures)   Other Visit Diagnoses       Grief       Relevant Medications   hydrOXYzine  (VISTARIL ) 25 MG capsule     Prediabetes       Relevant Orders   Hemoglobin A1c      Return in about 3 months (around 08/29/2024) for prediabetes, Weight management, depression and anxiety.     Roselie [facility], NP      [1]  Outpatient Medications Prior to Visit  Medication Sig   albuterol  (VENTOLIN  HFA) 108 (90 Base) MCG/ACT inhaler 1 to 2 puffs (5 minutes apart) every 4 hour as needed for Asthma   cetirizine  (ZYRTEC ) 10 MG tablet Take 1 tablet (10 mg total) by mouth daily.   cholecalciferol (VITAMIN D ) 1000 UNITS tablet Take 1,000 Units by mouth daily.   EPINEPHrine  0.3 mg/0.3 mL IJ SOAJ injection Inject 0.3 mg into the muscle as needed for anaphylaxis.   fluticasone  (FLONASE ) 50 MCG/ACT nasal spray Place 1 spray into both nostrils daily for 3 days.   Multiple Vitamin (MULTIVITAMIN) tablet Take 1 tablet by mouth daily.   rosuvastatin  (CRESTOR ) 5 MG tablet Take 1 tablet (5 mg total) by mouth daily.   SUMAtriptan  (IMITREX ) 50 MG tablet Take 1 tablet (50 mg total) by mouth once as needed for migraine. May repeat in 2  hours if headache persists or recurs. No more than 2 tabs in 24 hours   [DISCONTINUED] Guaifenesin  1200 MG TB12 Take 1 tablet (1,200 mg total) by mouth in the morning and at bedtime.   [DISCONTINUED] Liraglutide  -Weight Management (SAXENDA ) 18 MG/3ML SOPN Inject 0.6mg  daily x 1week, then 1.2mg  daily till next appointment in 4weeks   [DISCONTINUED] medroxyPROGESTERone  (PROVERA ) 5 MG tablet Take 1 tablet (5 mg total) by mouth daily for  7 days. 1 tab PO daily x 7 days every 3 months if no natural menstrual period.   [DISCONTINUED] methocarbamol  (ROBAXIN ) 500 MG tablet Take 1 tablet (500 mg total) by mouth 2 (two) times daily.   [DISCONTINUED] mirtazapine  (REMERON  SOL-TAB) 15 MG disintegrating tablet Take 1 tablet (15 mg total) by mouth at bedtime.   [DISCONTINUED] promethazine -dextromethorphan (PROMETHAZINE -DM) 6.25-15 MG/5ML syrup Take 5 mLs by mouth at bedtime as needed for cough.   No facility-administered medications prior to visit.   "

## 2024-06-01 ENCOUNTER — Ambulatory Visit: Payer: Self-pay | Admitting: Nurse Practitioner

## 2024-06-07 ENCOUNTER — Other Ambulatory Visit: Payer: Self-pay | Admitting: Nurse Practitioner

## 2024-06-07 DIAGNOSIS — Z79899 Other long term (current) drug therapy: Secondary | ICD-10-CM

## 2024-06-07 DIAGNOSIS — J45909 Unspecified asthma, uncomplicated: Secondary | ICD-10-CM
